# Patient Record
Sex: Male | Born: 1940 | Race: White | Hispanic: No | Marital: Married | Smoking: Never smoker
Health system: Southern US, Community
[De-identification: ages and names within clinical notes are randomized; demographics above are authoritative.]

## PROBLEM LIST (undated history)

## (undated) DIAGNOSIS — F0390 Unspecified dementia without behavioral disturbance: Secondary | ICD-10-CM

## (undated) DIAGNOSIS — R413 Other amnesia: Secondary | ICD-10-CM

## (undated) DIAGNOSIS — R251 Tremor, unspecified: Secondary | ICD-10-CM

## (undated) DIAGNOSIS — R269 Unspecified abnormalities of gait and mobility: Secondary | ICD-10-CM

## (undated) DIAGNOSIS — R972 Elevated prostate specific antigen [PSA]: Secondary | ICD-10-CM

## (undated) DIAGNOSIS — I10 Essential (primary) hypertension: Secondary | ICD-10-CM

## (undated) DIAGNOSIS — G609 Hereditary and idiopathic neuropathy, unspecified: Secondary | ICD-10-CM

## (undated) DIAGNOSIS — N529 Male erectile dysfunction, unspecified: Secondary | ICD-10-CM

## (undated) DIAGNOSIS — N400 Enlarged prostate without lower urinary tract symptoms: Secondary | ICD-10-CM

## (undated) DIAGNOSIS — N200 Calculus of kidney: Secondary | ICD-10-CM

## (undated) DIAGNOSIS — E119 Type 2 diabetes mellitus without complications: Secondary | ICD-10-CM

## (undated) DIAGNOSIS — G4733 Obstructive sleep apnea (adult) (pediatric): Secondary | ICD-10-CM

## (undated) DIAGNOSIS — L719 Rosacea, unspecified: Secondary | ICD-10-CM

## (undated) DIAGNOSIS — S86019A Strain of unspecified Achilles tendon, initial encounter: Secondary | ICD-10-CM

## (undated) HISTORY — DX: Tremor, unspecified: R25.1

## (undated) HISTORY — PX: TONSILLECTOMY: SUR1361

## (undated) HISTORY — PX: TRANSURETHRAL RESECTION OF PROSTATE: SHX73

## (undated) HISTORY — DX: Other amnesia: R41.3

## (undated) HISTORY — DX: Elevated prostate specific antigen (PSA): R97.20

## (undated) HISTORY — DX: Male erectile dysfunction, unspecified: N52.9

## (undated) HISTORY — DX: Hereditary and idiopathic neuropathy, unspecified: G60.9

## (undated) HISTORY — PX: JOINT REPLACEMENT: SHX530

## (undated) HISTORY — PX: INGUINAL HERNIA REPAIR: SHX194

## (undated) HISTORY — PX: TONSILLECTOMY AND ADENOIDECTOMY: SHX28

## (undated) HISTORY — DX: Unspecified dementia, unspecified severity, without behavioral disturbance, psychotic disturbance, mood disturbance, and anxiety: F03.90

## (undated) HISTORY — DX: Unspecified abnormalities of gait and mobility: R26.9

## (undated) HISTORY — DX: Benign prostatic hyperplasia without lower urinary tract symptoms: N40.0

## (undated) HISTORY — DX: Strain of unspecified achilles tendon, initial encounter: S86.019A

## (undated) HISTORY — DX: Rosacea, unspecified: L71.9

## (undated) HISTORY — DX: Obstructive sleep apnea (adult) (pediatric): G47.33

## (undated) HISTORY — PX: EYE SURGERY: SHX253

## (undated) HISTORY — DX: Type 2 diabetes mellitus without complications: E11.9

## (undated) HISTORY — DX: Essential (primary) hypertension: I10

## (undated) HISTORY — DX: Calculus of kidney: N20.0

---

## 1959-10-10 HISTORY — PX: KNEE SURGERY: SHX244

## 2000-10-05 ENCOUNTER — Encounter (INDEPENDENT_AMBULATORY_CARE_PROVIDER_SITE_OTHER): Payer: Self-pay | Admitting: Specialist

## 2000-10-05 ENCOUNTER — Other Ambulatory Visit: Admission: RE | Admit: 2000-10-05 | Discharge: 2000-10-05 | Payer: Self-pay | Admitting: Internal Medicine

## 2002-10-09 HISTORY — PX: CARPAL TUNNEL RELEASE: SHX101

## 2004-08-31 ENCOUNTER — Ambulatory Visit: Payer: Self-pay | Admitting: Internal Medicine

## 2004-09-08 ENCOUNTER — Ambulatory Visit: Payer: Self-pay | Admitting: Internal Medicine

## 2005-02-17 ENCOUNTER — Observation Stay (HOSPITAL_COMMUNITY): Admission: RE | Admit: 2005-02-17 | Discharge: 2005-02-18 | Payer: Self-pay | Admitting: Urology

## 2005-03-01 ENCOUNTER — Observation Stay (HOSPITAL_COMMUNITY): Admission: RE | Admit: 2005-03-01 | Discharge: 2005-03-02 | Payer: Self-pay | Admitting: Urology

## 2005-08-25 ENCOUNTER — Ambulatory Visit: Payer: Self-pay | Admitting: Internal Medicine

## 2005-09-05 ENCOUNTER — Ambulatory Visit: Payer: Self-pay | Admitting: Internal Medicine

## 2005-09-19 ENCOUNTER — Ambulatory Visit: Payer: Self-pay | Admitting: Internal Medicine

## 2005-09-19 ENCOUNTER — Encounter (INDEPENDENT_AMBULATORY_CARE_PROVIDER_SITE_OTHER): Payer: Self-pay | Admitting: *Deleted

## 2005-09-20 ENCOUNTER — Ambulatory Visit: Payer: Self-pay | Admitting: Internal Medicine

## 2005-09-28 ENCOUNTER — Ambulatory Visit: Payer: Self-pay | Admitting: Internal Medicine

## 2005-10-09 HISTORY — PX: HAND SURGERY: SHX662

## 2005-11-14 ENCOUNTER — Ambulatory Visit: Payer: Self-pay | Admitting: Internal Medicine

## 2006-03-08 ENCOUNTER — Ambulatory Visit (HOSPITAL_COMMUNITY): Admission: RE | Admit: 2006-03-08 | Discharge: 2006-03-08 | Payer: Self-pay | Admitting: Urology

## 2006-03-19 ENCOUNTER — Ambulatory Visit (HOSPITAL_COMMUNITY): Admission: RE | Admit: 2006-03-19 | Discharge: 2006-03-19 | Payer: Self-pay | Admitting: Urology

## 2006-03-28 ENCOUNTER — Encounter (INDEPENDENT_AMBULATORY_CARE_PROVIDER_SITE_OTHER): Payer: Self-pay | Admitting: *Deleted

## 2006-03-28 ENCOUNTER — Inpatient Hospital Stay (HOSPITAL_COMMUNITY): Admission: RE | Admit: 2006-03-28 | Discharge: 2006-03-30 | Payer: Self-pay | Admitting: Urology

## 2006-08-06 ENCOUNTER — Encounter: Payer: Self-pay | Admitting: Internal Medicine

## 2006-08-21 ENCOUNTER — Ambulatory Visit: Payer: Self-pay | Admitting: Internal Medicine

## 2006-08-21 LAB — CONVERTED CEMR LAB
ALT: 25 units/L (ref 0–40)
AST: 28 units/L (ref 0–37)
Albumin: 3.8 g/dL (ref 3.5–5.2)
Alkaline Phosphatase: 55 units/L (ref 39–117)
BUN: 19 mg/dL (ref 6–23)
Basophils Absolute: 0 10*3/uL (ref 0.0–0.1)
Basophils Relative: 0.2 % (ref 0.0–1.0)
CO2: 28 meq/L (ref 19–32)
Calcium: 9.3 mg/dL (ref 8.4–10.5)
Chloride: 102 meq/L (ref 96–112)
Chol/HDL Ratio, serum: 6.8
Cholesterol: 174 mg/dL (ref 0–200)
Creatinine, Ser: 1.5 mg/dL (ref 0.4–1.5)
Eosinophil percent: 1 % (ref 0.0–5.0)
GFR calc non Af Amer: 50 mL/min
Glomerular Filtration Rate, Af Am: 60 mL/min/{1.73_m2}
Glucose, Bld: 151 mg/dL — ABNORMAL HIGH (ref 70–99)
HCT: 43.9 % (ref 39.0–52.0)
HDL: 25.7 mg/dL — ABNORMAL LOW (ref >39.0)
Hemoglobin: 13.9 g/dL (ref 13.0–17.0)
Hgb A1c MFr Bld: 6.5 % — ABNORMAL HIGH (ref 4.6–6.0)
LDL Cholesterol: 117 mg/dL — ABNORMAL HIGH (ref 0–99)
Lymphocytes Relative: 21.1 % (ref 12.0–46.0)
MCHC: 31.7 g/dL (ref 30.0–36.0)
MCV: 68.6 fL — ABNORMAL LOW (ref 78.0–100.0)
Monocytes Absolute: 0.9 10*3/uL — ABNORMAL HIGH (ref 0.2–0.7)
Monocytes Relative: 12.1 % — ABNORMAL HIGH (ref 3.0–11.0)
Neutro Abs: 5.1 10*3/uL (ref 1.4–7.7)
Neutrophils Relative %: 65.6 % (ref 43.0–77.0)
PSA: 0.93 ng/mL (ref 0.10–4.00)
Platelets: 189 10*3/uL (ref 150–400)
Potassium: 4.7 meq/L (ref 3.5–5.1)
RBC: 6.39 M/uL — ABNORMAL HIGH (ref 4.22–5.81)
RDW: 15.9 % — ABNORMAL HIGH (ref 11.5–14.6)
Sodium: 139 meq/L (ref 135–145)
TSH: 1.64 microintl units/mL (ref 0.35–5.50)
Total Bilirubin: 1.5 mg/dL — ABNORMAL HIGH (ref 0.3–1.2)
Total Protein: 6.7 g/dL (ref 6.0–8.3)
Triglyceride fasting, serum: 159 mg/dL — ABNORMAL HIGH (ref 0–149)
VLDL: 32 mg/dL (ref 0–40)
WBC: 7.7 10*3/uL (ref 4.5–10.5)

## 2006-08-28 ENCOUNTER — Ambulatory Visit: Payer: Self-pay | Admitting: Internal Medicine

## 2006-09-03 ENCOUNTER — Encounter: Admission: RE | Admit: 2006-09-03 | Discharge: 2006-12-02 | Payer: Self-pay | Admitting: Internal Medicine

## 2006-09-26 ENCOUNTER — Ambulatory Visit: Payer: Self-pay | Admitting: Internal Medicine

## 2007-01-10 ENCOUNTER — Ambulatory Visit: Payer: Self-pay | Admitting: Internal Medicine

## 2007-01-10 DIAGNOSIS — N400 Enlarged prostate without lower urinary tract symptoms: Secondary | ICD-10-CM | POA: Insufficient documentation

## 2007-01-10 DIAGNOSIS — N183 Chronic kidney disease, stage 3 (moderate): Secondary | ICD-10-CM

## 2007-01-10 DIAGNOSIS — M109 Gout, unspecified: Secondary | ICD-10-CM

## 2007-01-10 DIAGNOSIS — I129 Hypertensive chronic kidney disease with stage 1 through stage 4 chronic kidney disease, or unspecified chronic kidney disease: Secondary | ICD-10-CM

## 2007-01-10 LAB — CONVERTED CEMR LAB
ALT: 18 units/L (ref 0–40)
AST: 23 units/L (ref 0–37)
Albumin: 4 g/dL (ref 3.5–5.2)
Alkaline Phosphatase: 72 units/L (ref 39–117)
BUN: 23 mg/dL (ref 6–23)
Bilirubin, Direct: 0.3 mg/dL (ref 0.0–0.3)
CO2: 29 meq/L (ref 19–32)
Calcium: 9.3 mg/dL (ref 8.4–10.5)
Chloride: 108 meq/L (ref 96–112)
Cholesterol: 146 mg/dL (ref 0–200)
Creatinine, Ser: 1.4 mg/dL (ref 0.4–1.5)
GFR calc Af Amer: 65 mL/min
GFR calc non Af Amer: 54 mL/min
Glucose, Bld: 94 mg/dL (ref 70–99)
HDL: 31 mg/dL — ABNORMAL LOW (ref >39.0)
Hgb A1c MFr Bld: 5.9 % (ref 4.6–6.0)
LDL Cholesterol: 96 mg/dL (ref 0–99)
Potassium: 4.7 meq/L (ref 3.5–5.1)
Sodium: 142 meq/L (ref 135–145)
Total Bilirubin: 1.8 mg/dL — ABNORMAL HIGH (ref 0.3–1.2)
Total CHOL/HDL Ratio: 4.7
Total Protein: 6.9 g/dL (ref 6.0–8.3)
Triglycerides: 94 mg/dL (ref 0–149)
VLDL: 19 mg/dL (ref 0–40)

## 2007-06-04 DIAGNOSIS — R972 Elevated prostate specific antigen [PSA]: Secondary | ICD-10-CM | POA: Insufficient documentation

## 2007-06-04 DIAGNOSIS — F528 Other sexual dysfunction not due to a substance or known physiological condition: Secondary | ICD-10-CM

## 2007-07-11 ENCOUNTER — Ambulatory Visit: Payer: Self-pay | Admitting: Internal Medicine

## 2007-07-12 LAB — CONVERTED CEMR LAB
ALT: 19 units/L (ref 0–53)
AST: 23 units/L (ref 0–37)
Albumin: 3.9 g/dL (ref 3.5–5.2)
Alkaline Phosphatase: 45 units/L (ref 39–117)
BUN: 19 mg/dL (ref 6–23)
Bilirubin, Direct: 0.2 mg/dL (ref 0.0–0.3)
CO2: 26 meq/L (ref 19–32)
Calcium: 9.2 mg/dL (ref 8.4–10.5)
Chloride: 110 meq/L (ref 96–112)
Cholesterol: 141 mg/dL (ref 0–200)
Creatinine, Ser: 1.7 mg/dL — ABNORMAL HIGH (ref 0.4–1.5)
GFR calc Af Amer: 52 mL/min
GFR calc non Af Amer: 43 mL/min
Glucose, Bld: 106 mg/dL — ABNORMAL HIGH (ref 70–99)
HDL: 28.1 mg/dL — ABNORMAL LOW (ref >39.0)
Hgb A1c MFr Bld: 5.7 % (ref 4.6–6.0)
LDL Cholesterol: 94 mg/dL (ref 0–99)
Potassium: 4.9 meq/L (ref 3.5–5.1)
Sodium: 140 meq/L (ref 135–145)
Total Bilirubin: 1.1 mg/dL (ref 0.3–1.2)
Total CHOL/HDL Ratio: 5
Total Protein: 6.4 g/dL (ref 6.0–8.3)
Triglycerides: 97 mg/dL (ref 0–149)
VLDL: 19 mg/dL (ref 0–40)

## 2007-07-22 ENCOUNTER — Encounter: Payer: Self-pay | Admitting: Internal Medicine

## 2007-08-06 ENCOUNTER — Ambulatory Visit (HOSPITAL_BASED_OUTPATIENT_CLINIC_OR_DEPARTMENT_OTHER): Admission: RE | Admit: 2007-08-06 | Discharge: 2007-08-06 | Payer: Self-pay | Admitting: Orthopedic Surgery

## 2007-08-06 ENCOUNTER — Encounter (INDEPENDENT_AMBULATORY_CARE_PROVIDER_SITE_OTHER): Payer: Self-pay | Admitting: Orthopedic Surgery

## 2007-12-26 ENCOUNTER — Ambulatory Visit: Payer: Self-pay | Admitting: Internal Medicine

## 2008-01-09 ENCOUNTER — Ambulatory Visit: Payer: Self-pay | Admitting: Internal Medicine

## 2008-01-10 LAB — CONVERTED CEMR LAB
ALT: 26 units/L (ref 0–53)
Alkaline Phosphatase: 47 units/L (ref 39–117)
BUN: 19 mg/dL (ref 6–23)
Bilirubin, Direct: 0.2 mg/dL (ref 0.0–0.3)
CO2: 28 meq/L (ref 19–32)
Chloride: 106 meq/L (ref 96–112)
Creatinine, Ser: 1.9 mg/dL — ABNORMAL HIGH (ref 0.4–1.5)
GFR calc Af Amer: 45 mL/min
HDL: 20.7 mg/dL — ABNORMAL LOW (ref >39.0)
Hgb A1c MFr Bld: 6.1 % — ABNORMAL HIGH (ref 4.6–6.0)
Sodium: 139 meq/L (ref 135–145)
Total CHOL/HDL Ratio: 7.2

## 2008-09-01 ENCOUNTER — Ambulatory Visit: Payer: Self-pay | Admitting: Internal Medicine

## 2008-09-02 LAB — CONVERTED CEMR LAB
CO2: 28 meq/L (ref 19–32)
Calcium: 9.2 mg/dL (ref 8.4–10.5)
Chloride: 105 meq/L (ref 96–112)
Cholesterol: 149 mg/dL (ref 0–200)
Creatinine, Ser: 1.8 mg/dL — ABNORMAL HIGH (ref 0.4–1.5)
GFR calc non Af Amer: 40 mL/min
LDL Cholesterol: 94 mg/dL (ref 0–99)
Potassium: 5.2 meq/L — ABNORMAL HIGH (ref 3.5–5.1)
Sodium: 139 meq/L (ref 135–145)
Total CHOL/HDL Ratio: 5.2
Triglycerides: 131 mg/dL (ref 0–149)

## 2008-12-07 ENCOUNTER — Ambulatory Visit: Payer: Self-pay | Admitting: Internal Medicine

## 2008-12-11 ENCOUNTER — Ambulatory Visit: Payer: Self-pay | Admitting: Internal Medicine

## 2008-12-21 ENCOUNTER — Telehealth: Payer: Self-pay | Admitting: Family Medicine

## 2008-12-22 ENCOUNTER — Ambulatory Visit: Payer: Self-pay | Admitting: Internal Medicine

## 2008-12-22 ENCOUNTER — Encounter: Payer: Self-pay | Admitting: Internal Medicine

## 2008-12-22 LAB — HM COLONOSCOPY

## 2008-12-24 ENCOUNTER — Telehealth: Payer: Self-pay | Admitting: Internal Medicine

## 2008-12-24 ENCOUNTER — Encounter: Payer: Self-pay | Admitting: Internal Medicine

## 2008-12-25 ENCOUNTER — Ambulatory Visit: Payer: Self-pay | Admitting: Internal Medicine

## 2008-12-25 ENCOUNTER — Ambulatory Visit: Payer: Self-pay | Admitting: Cardiology

## 2008-12-28 ENCOUNTER — Telehealth: Payer: Self-pay | Admitting: Internal Medicine

## 2009-03-11 ENCOUNTER — Telehealth: Payer: Self-pay | Admitting: *Deleted

## 2009-03-17 ENCOUNTER — Encounter: Payer: Self-pay | Admitting: Internal Medicine

## 2009-05-17 ENCOUNTER — Telehealth: Payer: Self-pay | Admitting: Internal Medicine

## 2009-05-26 ENCOUNTER — Ambulatory Visit: Payer: Self-pay | Admitting: Internal Medicine

## 2009-05-31 LAB — CONVERTED CEMR LAB
ALT: 21 units/L (ref 0–53)
AST: 25 units/L (ref 0–37)
Albumin: 4.1 g/dL (ref 3.5–5.2)
Alkaline Phosphatase: 57 units/L (ref 39–117)
CO2: 26 meq/L (ref 19–32)
Cholesterol: 154 mg/dL (ref 0–200)
Creatinine, Ser: 2 mg/dL — ABNORMAL HIGH (ref 0.4–1.5)
Direct LDL: 97.8 mg/dL
GFR calc non Af Amer: 35.24 mL/min (ref >60)
Glucose, Bld: 92 mg/dL (ref 70–99)
HDL: 26.7 mg/dL — ABNORMAL LOW (ref >39.00)
Potassium: 5.3 meq/L — ABNORMAL HIGH (ref 3.5–5.1)
Triglycerides: 206 mg/dL — ABNORMAL HIGH (ref 0.0–149.0)

## 2009-06-24 ENCOUNTER — Telehealth: Payer: Self-pay | Admitting: Internal Medicine

## 2009-06-24 ENCOUNTER — Ambulatory Visit: Payer: Self-pay | Admitting: Internal Medicine

## 2009-06-29 LAB — CONVERTED CEMR LAB
Calcium: 9 mg/dL (ref 8.4–10.5)
Chloride: 111 meq/L (ref 96–112)
Creatinine, Ser: 2.1 mg/dL — ABNORMAL HIGH (ref 0.4–1.5)
Glucose, Bld: 108 mg/dL — ABNORMAL HIGH (ref 70–99)
Potassium: 5.4 meq/L — ABNORMAL HIGH (ref 3.5–5.1)

## 2009-07-14 ENCOUNTER — Encounter: Payer: Self-pay | Admitting: Internal Medicine

## 2009-07-19 ENCOUNTER — Encounter: Payer: Self-pay | Admitting: Internal Medicine

## 2009-07-20 ENCOUNTER — Encounter: Payer: Self-pay | Admitting: Internal Medicine

## 2009-07-22 ENCOUNTER — Ambulatory Visit: Payer: Self-pay | Admitting: Internal Medicine

## 2009-07-22 ENCOUNTER — Telehealth: Payer: Self-pay | Admitting: Internal Medicine

## 2009-07-23 LAB — CONVERTED CEMR LAB
Calcium: 8.8 mg/dL (ref 8.4–10.5)
Chloride: 106 meq/L (ref 96–112)
Potassium: 4.7 meq/L (ref 3.5–5.1)

## 2009-08-11 ENCOUNTER — Telehealth: Payer: Self-pay | Admitting: Internal Medicine

## 2009-08-16 ENCOUNTER — Encounter: Payer: Self-pay | Admitting: Internal Medicine

## 2009-09-16 ENCOUNTER — Encounter: Payer: Self-pay | Admitting: Internal Medicine

## 2009-11-04 ENCOUNTER — Ambulatory Visit: Payer: Self-pay | Admitting: Internal Medicine

## 2009-11-04 LAB — CONVERTED CEMR LAB
ALT: 20 units/L (ref 0–53)
BUN: 22 mg/dL (ref 6–23)
Chloride: 110 meq/L (ref 96–112)
GFR calc non Af Amer: 35.19 mL/min (ref >60)
HDL: 33.2 mg/dL — ABNORMAL LOW (ref >39.00)
LDL Cholesterol: 92 mg/dL (ref 0–99)
Potassium: 4.7 meq/L (ref 3.5–5.1)
Total CHOL/HDL Ratio: 4

## 2009-11-22 ENCOUNTER — Ambulatory Visit (HOSPITAL_BASED_OUTPATIENT_CLINIC_OR_DEPARTMENT_OTHER): Admission: RE | Admit: 2009-11-22 | Discharge: 2009-11-22 | Payer: Self-pay | Admitting: Internal Medicine

## 2009-11-22 ENCOUNTER — Encounter: Payer: Self-pay | Admitting: Internal Medicine

## 2009-11-28 ENCOUNTER — Ambulatory Visit: Payer: Self-pay | Admitting: Pulmonary Disease

## 2009-12-14 ENCOUNTER — Encounter: Payer: Self-pay | Admitting: Internal Medicine

## 2009-12-21 ENCOUNTER — Encounter: Payer: Self-pay | Admitting: Internal Medicine

## 2009-12-31 ENCOUNTER — Encounter: Payer: Self-pay | Admitting: Internal Medicine

## 2010-03-03 ENCOUNTER — Ambulatory Visit: Payer: Self-pay | Admitting: Internal Medicine

## 2010-03-03 DIAGNOSIS — M1710 Unilateral primary osteoarthritis, unspecified knee: Secondary | ICD-10-CM

## 2010-03-03 DIAGNOSIS — IMO0002 Reserved for concepts with insufficient information to code with codable children: Secondary | ICD-10-CM | POA: Insufficient documentation

## 2010-03-15 ENCOUNTER — Ambulatory Visit: Payer: Self-pay | Admitting: Internal Medicine

## 2010-03-16 LAB — CONVERTED CEMR LAB: Potassium: 5.6 meq/L — ABNORMAL HIGH (ref 3.5–5.1)

## 2010-03-22 ENCOUNTER — Ambulatory Visit: Payer: Self-pay | Admitting: Internal Medicine

## 2010-03-31 ENCOUNTER — Telehealth: Payer: Self-pay | Admitting: Internal Medicine

## 2010-04-04 ENCOUNTER — Inpatient Hospital Stay (HOSPITAL_COMMUNITY)
Admission: RE | Admit: 2010-04-04 | Discharge: 2010-04-07 | Payer: Self-pay | Source: Home / Self Care | Admitting: Orthopedic Surgery

## 2010-05-02 ENCOUNTER — Ambulatory Visit: Payer: Self-pay | Admitting: Internal Medicine

## 2010-05-02 DIAGNOSIS — R5381 Other malaise: Secondary | ICD-10-CM

## 2010-05-02 DIAGNOSIS — R5383 Other fatigue: Secondary | ICD-10-CM

## 2010-05-03 LAB — CONVERTED CEMR LAB
AST: 22 units/L (ref 0–37)
BUN: 21 mg/dL (ref 6–23)
Chloride: 101 meq/L (ref 96–112)
Creatinine, Ser: 1.6 mg/dL — ABNORMAL HIGH (ref 0.4–1.5)
Eosinophils Relative: 3.8 % (ref 0.0–5.0)
GFR calc non Af Amer: 47.16 mL/min (ref >60)
Hemoglobin: 11 g/dL — ABNORMAL LOW (ref 13.0–17.0)
Monocytes Relative: 16.4 % — ABNORMAL HIGH (ref 3.0–12.0)
Neutrophils Relative %: 58.8 % (ref 43.0–77.0)
Platelets: 268 10*3/uL (ref 150.0–400.0)
Potassium: 4.9 meq/L (ref 3.5–5.1)
RBC: 4.99 M/uL (ref 4.22–5.81)
Sed Rate: 31 mm/hr — ABNORMAL HIGH (ref 0–22)
Total Bilirubin: 0.7 mg/dL (ref 0.3–1.2)
Total Protein: 6.8 g/dL (ref 6.0–8.3)
WBC: 7.2 10*3/uL (ref 4.5–10.5)

## 2010-05-11 ENCOUNTER — Encounter: Payer: Self-pay | Admitting: Internal Medicine

## 2010-05-16 ENCOUNTER — Encounter: Payer: Self-pay | Admitting: Internal Medicine

## 2010-05-29 ENCOUNTER — Encounter: Payer: Self-pay | Admitting: Internal Medicine

## 2010-07-14 ENCOUNTER — Telehealth: Payer: Self-pay | Admitting: *Deleted

## 2010-10-04 ENCOUNTER — Telehealth: Payer: Self-pay | Admitting: Internal Medicine

## 2010-11-06 LAB — CONVERTED CEMR LAB
BUN: 27 mg/dL — ABNORMAL HIGH (ref 6–23)
Bilirubin, Direct: 0.1 mg/dL (ref 0.0–0.3)
CO2: 26 meq/L (ref 19–32)
Cholesterol: 147 mg/dL (ref 0–200)
Creatinine, Ser: 1.7 mg/dL — ABNORMAL HIGH (ref 0.4–1.5)
HDL: 30.8 mg/dL — ABNORMAL LOW (ref >39.00)
LDL Cholesterol: 101 mg/dL — ABNORMAL HIGH (ref 0–99)
Sodium: 139 meq/L (ref 135–145)
Total CHOL/HDL Ratio: 5
Total Protein: 6.6 g/dL (ref 6.0–8.3)
Triglycerides: 76 mg/dL (ref 0.0–149.0)
VLDL: 15.2 mg/dL (ref 0.0–40.0)

## 2010-11-08 NOTE — Miscellaneous (Signed)
Summary: Orders Update  Clinical Lists Changes  Orders: Added new Referral order of Home Health Referral (Home Health) - Signed 

## 2010-11-08 NOTE — Progress Notes (Signed)
Summary: Flu immunization [facility]    Immunization History:  Influenza Immunization History:    Influenza:  historical (07/13/2010)

## 2010-11-08 NOTE — Medication Information (Signed)
Summary: Order Confirmation for CPAP Supplies  Order Confirmation for CPAP Supplies   Imported By: Maryln Gottron 12/24/2009 15:08:08  _____________________________________________________________________  External Attachment:    Type:   Image     Comment:   External Document

## 2010-11-08 NOTE — Medication Information (Signed)
Summary: Order for CPAP Supplies/[facility]  Order for CPAP Supplies/[facility]   Imported By: Maryln Gottron 01/04/2010 14:27:18  _____________________________________________________________________  External Attachment:    Type:   Image     Comment:   External Document

## 2010-11-08 NOTE — Assessment & Plan Note (Signed)
Summary: medical clearance for knee surgery/njr   Vital Signs:  Patient profile:   70 year old male Weight:      280 pounds BMI:     35.12 Temp:     98.4 degrees F Pulse rate:   60 / minute Pulse rhythm:   regular Resp:     14 per minute BP sitting:   136 / 78  (left arm) Cuff size:   large  Vitals Entered By: Gladis Riffle, RN (Mar 03, 2010 9:01 AM) CC: surgical clearance for left knee replacement Dr Despina Hick Is Patient Diabetic? Yes Did you bring your meter with you today? No   CC:  surgical clearance for left knee replacement Dr Despina Hick.  History of Present Illness: pt here for medical evaluation prior to knee surgery Dr. Gerri Spore pt denies CP,SOB, PND or any other cardiovascular complaints Pt has never had trouble with general anesthesia  Pt remains active and has no cardiac sxs with exercise  All other systems reviewed and were negative   Preventive Screening-Counseling & Management  Alcohol-Tobacco     Smoking Status: quit  Current Problems (verified): 1)  Psa, Increased  (ICD-790.93) 2)  Erectile Dysfunction  (ICD-302.72) 3)  Obstructive Sleep Apnea  (ICD-327.23) 4)  Benign Prostatic Hypertrophy  (ICD-600.00) 5)  Nephrolithiasis, Hx of  (ICD-V13.01) 6)  Hypertension  (ICD-401.9) 7)  Gout  (ICD-274.9) 8)  Diabetes Mellitus, Type II  (ICD-250.00)  Current Medications (verified): 1)  Lisinopril 20 Mg Tabs (Lisinopril) .... Take 1 Tablet By Mouth Once A Day 2)  Finasteride 5 Mg  Tabs (Finasteride) .... Take 1 Tablet By Mouth Once A Day 3)  Sulfamethoxazole-Tmp Ds 800-160 Mg  Tabs (Sulfamethoxazole-Trimethoprim) .... Take 1 Tablet By Mouth Two Times A Day 4)  Fluticasone Propionate 50 Mcg/act  Susp (Fluticasone Propionate) .... 2 Sprays Each Nostril Once Daily  Allergies: 1)  ! Cipro (Ciprofloxacin Hcl)  Past History:  Past Medical History: Last updated: 07/11/2007 obstructive sleep apnea Diabetes mellitus, type II Gout Hypertension Nephrolithiasis, hx  of Benign prostatic hypertrophy rosacea partial Achilles tear erectile dysfunction elevated PSA right shoulder (skin surgery)  Past Surgical History: Last updated: 06/04/2007 Carpal tunnel release  2004 Inguinal herniorrhaphy L Transurethral resection of prostate Tonsillectomy, adenoidectomy hand surgery R  2007 knee surgery L  1961  Family History: Last updated: 07/11/2007 Family History Diabetes 1st degree relative son with thalassemia  Social History: Last updated: 07/11/2007 Married Regular exercise-yes  Risk Factors: Exercise: yes (07/11/2007)  Risk Factors: Smoking Status: quit (03/03/2010)  Physical Exam  General:  alert and well-developed.   Head:  normocephalic and atraumatic.   Eyes:  pupils equal and pupils round.   Ears:  R ear normal and L ear normal.   Neck:  No deformities, masses, or tenderness noted. Chest Wall:  no deformities and no tenderness.   Lungs:  normal respiratory effort and no accessory muscle use.   Heart:  normal rate and regular rhythm.   Abdomen:  soft and non-tender.   Msk:  No deformity or scoliosis noted of thoracic or lumbar spine.   Pulses:  R radial normal and L radial normal.   Neurologic:  cranial nerves II-XII intact.   Skin:  turgor normal and color normal.   Psych:  normally interactive and good eye contact.     Impression & Recommendations:  Problem # 1:  OSTEOARTHRITIS, KNEE, LEFT (ICD-715.96) discussed risks he has not had any difficulty with anesthesia The following medications were removed from the medication list:  Aspirin 325 Mg Tabs (Aspirin) .Marland Kitchen... Take 1 tab by mouth every day  Problem # 2:  OBSTRUCTIVE SLEEP APNEA (ICD-327.23) will need to continue CPAP  Problem # 3:  DIABETES MELLITUS, TYPE II (ICD-250.00) check labs today The following medications were removed from the medication list:    Aspirin 325 Mg Tabs (Aspirin) .Marland Kitchen... Take 1 tab by mouth every day His updated medication list for this  problem includes:    Lisinopril 20 Mg Tabs (Lisinopril) .Marland Kitchen... Take 1 tablet by mouth once a day  Labs Reviewed: Creat: 2.0 (11/04/2009)    Reviewed HgBA1c results: 6.4 (11/04/2009)  6.3 (05/26/2009)  Orders: TLB-Lipid Panel (80061-LIPID) TLB-Hepatic/Liver Function Pnl (80076-HEPATIC) TLB-A1C / Hgb A1C (Glycohemoglobin) (83036-A1C)  Problem # 4:  GOUT (ICD-274.9) suspect re  Complete Medication List: 1)  Lisinopril 20 Mg Tabs (Lisinopril) .... Take 1 tablet by mouth once a day 2)  Finasteride 5 Mg Tabs (Finasteride) .... Take 1 tablet by mouth once a day 3)  Sulfamethoxazole-tmp Ds 800-160 Mg Tabs (Sulfamethoxazole-trimethoprim) .... Take 1 tablet by mouth two times a day 4)  Fluticasone Propionate 50 Mcg/act Susp (Fluticasone propionate) .... 2 sprays each nostril once daily  Other Orders: EKG w/ Interpretation (93000) Venipuncture (45409) TLB-BMP (Basic Metabolic Panel-BMET) (80048-METABOL)

## 2010-11-08 NOTE — Assessment & Plan Note (Signed)
Summary: 3 MONTH ROV/NJR   Vital Signs:  Patient profile:   70 year old male Height:      75 inches Weight:      280 pounds BMI:     35.12 Temp:     98.2 degrees F Pulse rate:   60 / minute Resp:     14 per minute BP sitting:   120 / 68  (left arm) Cuff size:   large  Vitals Entered By: Gladis Riffle, RN (November 04, 2009 8:27 AM)   History of Present Illness:  Follow-Up Visit      This is a 70 year old man who presents for Follow-up visit.  The patient denies chest pain and palpitations.  Since the last visit the patient notes no new problems or concerns.  The patient reports taking meds as prescribed and not taking meds as prescribed.  When questioned about possible medication side effects, the patient notes none.   was not taking fluticasone  All other systems reviewed and were negative  using CPAP  Preventive Screening-Counseling & Management  Alcohol-Tobacco     Smoking Status: quit  Current Problems (verified): 1)  Psa, Increased  (ICD-790.93) 2)  Erectile Dysfunction  (ICD-302.72) 3)  Obstructive Sleep Apnea  (ICD-327.23) 4)  Benign Prostatic Hypertrophy  (ICD-600.00) 5)  Nephrolithiasis, Hx of  (ICD-V13.01) 6)  Hypertension  (ICD-401.9) 7)  Gout  (ICD-274.9) 8)  Diabetes Mellitus, Type II  (ICD-250.00) 9)  Diabetes Mellitus, Type II, Controlled  (ICD-250.00)  Current Medications (verified): 1)  Lisinopril 20 Mg Tabs (Lisinopril) .... Take 1 Tablet By Mouth Once A Day 2)  Finasteride 5 Mg  Tabs (Finasteride) .... Take 1 Tablet By Mouth Once A Day 3)  Sulfamethoxazole-Tmp Ds 800-160 Mg  Tabs (Sulfamethoxazole-Trimethoprim) .... Take 1 Tablet By Mouth Two Times A Day 4)  Fluticasone Propionate 50 Mcg/act  Susp (Fluticasone Propionate) .... 2 Sprays Each Nostril Once Daily 5)  Vitamin D 2000 Unit Tabs (Cholecalciferol) .... Once Daily 6)  Omega-3 350 Mg Caps (Omega-3 Fatty Acids) .... Once Daily  Allergies: 1)  ! Cipro (Ciprofloxacin  Hcl)  Comments:  Nurse/Medical Assistant: 3 month rov, fasting  The patient's medications and allergies were reviewed with the patient and were updated in the Medication and Allergy Lists. Gladis Riffle, RN (November 04, 2009 8:30 AM)  Past History:  Past Medical History: Last updated: 07/11/2007 obstructive sleep apnea Diabetes mellitus, type II Gout Hypertension Nephrolithiasis, hx of Benign prostatic hypertrophy rosacea partial Achilles tear erectile dysfunction elevated PSA right shoulder (skin surgery)  Past Surgical History: Last updated: 06/04/2007 Carpal tunnel release  2004 Inguinal herniorrhaphy L Transurethral resection of prostate Tonsillectomy, adenoidectomy hand surgery R  2007 knee surgery L  1961  Family History: Last updated: 07/11/2007 Family History Diabetes 1st degree relative son with thalassemia  Social History: Last updated: 07/11/2007 Married Regular exercise-yes  Risk Factors: Exercise: yes (07/11/2007)  Risk Factors: Smoking Status: quit (11/04/2009)  Review of Systems       All other systems reviewed and were negative   Physical Exam  General:  overweight-appearing.  enlarged nodes.  No erythema. Head:  normocephalic and atraumatic.   Nose:  rosacea, large nose Neck:  No deformities, masses, or tenderness noted. Chest Wall:  no deformities and no tenderness.   Lungs:  normal respiratory effort.   Heart:  normal rate and regular rhythm.   Abdomen:  Bowel sounds positive,abdomen soft and non-tender without masses, organomegaly or hernias noted. overweight. Msk:  No deformity  or scoliosis noted of thoracic or lumbar spine.   Pulses:  R radial normal and L radial normal.   Neurologic:  cranial nerves II-XII intact and gait normal.     Impression & Recommendations:  Problem # 1:  HYPERTENSION (ICD-401.9) controlled continue current medications  His updated medication list for this problem includes:    Lisinopril 20 Mg Tabs  (Lisinopril) .Marland Kitchen... Take 1 tablet by mouth once a day  BP today: 120/68 Prior BP: 132/72 (05/26/2009)  Labs Reviewed: K+: 4.7 (07/22/2009) Creat: : 2.2 (07/22/2009)   Chol: 154 (05/26/2009)   HDL: 26.70 (05/26/2009)   LDL: 94 (09/01/2008)   TG: 206.0 (05/26/2009)  Problem # 2:  OBSTRUCTIVE SLEEP APNEA (ICD-327.23)  sleep study  Orders: Sleep Disorder Referral (Sleep Disorder)  Problem # 3:  DIABETES MELLITUS, TYPE II (ICD-250.00) check labs today needs to lose a tremendous amount of weight His updated medication list for this problem includes:    Lisinopril 20 Mg Tabs (Lisinopril) .Marland Kitchen... Take 1 tablet by mouth once a day    Aspirin 325 Mg Tabs (Aspirin) .Marland Kitchen... Take 1 tab by mouth every day  Orders: TLB-A1C / Hgb A1C (Glycohemoglobin) (83036-A1C) TLB-BMP (Basic Metabolic Panel-BMET) (80048-METABOL) TLB-Lipid Panel (80061-LIPID) TLB-ALT (SGPT) (84460-ALT)  Complete Medication List: 1)  Lisinopril 20 Mg Tabs (Lisinopril) .... Take 1 tablet by mouth once a day 2)  Finasteride 5 Mg Tabs (Finasteride) .... Take 1 tablet by mouth once a day 3)  Sulfamethoxazole-tmp Ds 800-160 Mg Tabs (Sulfamethoxazole-trimethoprim) .... Take 1 tablet by mouth two times a day 4)  Fluticasone Propionate 50 Mcg/act Susp (Fluticasone propionate) .... 2 sprays each nostril once daily 5)  Vitamin D 2000 Unit Tabs (Cholecalciferol) .... Once daily 6)  Omega-3 350 Mg Caps (Omega-3 fatty acids) .... Once daily 7)  Aspirin 325 Mg Tabs (Aspirin) .... Take 1 tab by mouth every day Prescriptions: FLUTICASONE PROPIONATE 50 MCG/ACT  SUSP (FLUTICASONE PROPIONATE) 2 sprays each nostril once daily  #1 vial x 3   Entered and Authorized by:   Birdie Sons MD   Signed by:   Birdie Sons MD on 11/04/2009   Method used:   Electronically to        [facility] [phone]* (retail)       [address]       [place], [place]  [address]       Ph: [phone] or [phone]       Fax:  [phone]   RxID:   [id number]

## 2010-11-08 NOTE — Assessment & Plan Note (Signed)
Summary: 4 week rov/et/wife rescd//ccm   Vital Signs:  Patient profile:   70 year old male Weight:      268 pounds Temp:     98.4 degrees F oral Pulse rate:   64 / minute Pulse rhythm:   regular BP sitting:   124 / 74  (left arm) Cuff size:   regular  Vitals Entered By: Kern Reap CMA Duncan Dull) (May 02, 2010 10:35 AM)  History of Present Illness: s/p TKA doing reasonably well tolerating rehab he feels generally weak, feels a little wobbly when he first stands,   DM---trying to follow a diet  Current Problems (verified): 1)  Osteoarthritis, Knee, Left  (ICD-715.96) 2)  Psa, Increased  (ICD-790.93) 3)  Erectile Dysfunction  (ICD-302.72) 4)  Obstructive Sleep Apnea  (ICD-327.23) 5)  Benign Prostatic Hypertrophy  (ICD-600.00) 6)  Nephrolithiasis, Hx of  (ICD-V13.01) 7)  Hypertension  (ICD-401.9) 8)  Gout  (ICD-274.9) 9)  Diabetes Mellitus, Type II  (ICD-250.00)  Current Medications (verified): 1)  Finasteride 5 Mg  Tabs (Finasteride) .... Take 1 Tablet By Mouth Once A Day 2)  Sulfamethoxazole-Tmp Ds 800-160 Mg  Tabs (Sulfamethoxazole-Trimethoprim) .... Take 1 Tablet By Mouth Two Times A Day 3)  Fluticasone Propionate 50 Mcg/act  Susp (Fluticasone Propionate) .... 2 Sprays Each Nostril Once Daily 4)  Diltiazem Hcl Er Beads 240 Mg Xr24h-Cap (Diltiazem Hcl Er Beads) .... Take 1 Tablet By Mouth Once A Day 5)  Stool Softner .... Once Daily 6)  Miralax  Powd (Polyethylene Glycol 3350) .... Once Daily  Allergies: 1)  ! Cipro (Ciprofloxacin Hcl)  Past History:  Past Medical History: Last updated: 07/11/2007 obstructive sleep apnea Diabetes mellitus, type II Gout Hypertension Nephrolithiasis, hx of Benign prostatic hypertrophy rosacea partial Achilles tear erectile dysfunction elevated PSA right shoulder (skin surgery)  Past Surgical History: Last updated: 06/04/2007 Carpal tunnel release  2004 Inguinal herniorrhaphy L Transurethral resection of  prostate Tonsillectomy, adenoidectomy hand surgery R  2007 knee surgery L  1961  Family History: Last updated: 07/11/2007 Family History Diabetes 1st degree relative son with thalassemia  Social History: Last updated: 07/11/2007 Married Regular exercise-yes  Risk Factors: Exercise: yes (07/11/2007)  Risk Factors: Smoking Status: quit (03/03/2010)  Physical Exam  General:  alert and well-developed.   Head:  normocephalic and atraumatic.   Eyes:  pupils equal and pupils round.   Neck:  No deformities, masses, or tenderness noted. Chest Wall:  no deformities and no tenderness.   Lungs:  normal respiratory effort and no accessory muscle use.   Msk:  surgical scar left knee Extremities:  trace left pedal edema and trace right pedal edema.   Neurologic:  alert & oriented X3 and cranial nerves II-XII intact.     Impression & Recommendations:  Problem # 1:  OSTEOARTHRITIS, KNEE, LEFT (ICD-715.96) s/p TKA fatigue after surgery this may be normal but will check labs   Problem # 2:  FATIGUE (ICD-780.79)   suspect  post op fatigue  Orders: TLB-BMP (Basic Metabolic Panel-BMET) (80048-METABOL) TLB-Hepatic/Liver Function Pnl (80076-HEPATIC) TLB-CBC Platelet - w/Differential (85025-CBCD) TLB-TSH (Thyroid Stimulating Hormone) (84443-TSH) Venipuncture (16109) TLB-Sedimentation Rate (ESR) (85652-ESR)  Complete Medication List: 1)  Finasteride 5 Mg Tabs (Finasteride) .... Take 1 tablet by mouth once a day 2)  Sulfamethoxazole-tmp Ds 800-160 Mg Tabs (Sulfamethoxazole-trimethoprim) .... Take 1 tablet by mouth two times a day 3)  Fluticasone Propionate 50 Mcg/act Susp (Fluticasone propionate) .... 2 sprays each nostril once daily 4)  Diltiazem Hcl Er Beads 240 Mg Xr24h-cap (  Diltiazem hcl er beads) .... Take 1 tablet by mouth once a day 5)  Stool Softner  .... Once daily 6)  Miralax Powd (Polyethylene glycol 3350) .... Once daily

## 2010-11-08 NOTE — Progress Notes (Signed)
Summary:  diltiazem  Phone Note Call from Patient Call back at Home Phone [phone]   Caller: Patient Call For: Birdie Sons MD Summary of Call: pt needs diltiazem 240 mg #90 with 3 refills rx fax to [phone] and please notify [facility] to stop lisinopril  Initial call taken by: Heron Sabins,  March 31, 2010 12:53 PM  Follow-up for Phone Call        See Rx.  DC Lisinopril added to Rx.  Left message to inform pt. Follow-up by: Gladis Riffle, RN,  March 31, 2010 1:01 PM    Prescriptions: DILTIAZEM HCL ER BEADS 240 MG XR24H-CAP (DILTIAZEM HCL ER BEADS) Take 1 tablet by mouth once a day  #90 x 3   Entered by:   Gladis Riffle, RN   Authorized by:   Birdie Sons MD   Signed by:   Gladis Riffle, RN on 03/31/2010   Method used:   Electronically to        [facility]* (retail)             ,          Ph: [phone]       Fax: [phone]   RxID:   [id number]

## 2010-11-08 NOTE — Medication Information (Signed)
Summary: Order Confirmation for CPAP Supplies  Order Confirmation for CPAP Supplies   Imported By: Maryln Gottron 12/17/2009 09:49:15  _____________________________________________________________________  External Attachment:    Type:   Image     Comment:   External Document

## 2010-11-10 NOTE — Progress Notes (Signed)
Summary: Call-A-Nurse Report   Call-A-Nurse Triage Call Report Triage Record Num: [id number] Operator: Di Kindle Patient Name: [name] Call Date & Time: 10/02/2010 8:38:11AM Patient Phone: [phone] PCP: Valetta Mole. Swords Patient Gender: Male PCP Fax : [phone] Patient DOB: [birth year] Practice Name: [facility] Reason for Call: Wife Joyce Gross calling, onset of sore throat 10/02/10, Per CSR "THE PATIENT REFUSED 911" has been garling and mucinex. States family has required ABX with this, afebrile. Guideline: sore throat, cough makes it difficult to sleep. Advised of need to be seen if continues/worsens. Protocol(s) Used: Sore Throat or Hoarseness Recommended Outcome per Protocol: Provide Home/Self Care Reason for Outcome: All other situations Care Advice:  ~ Use a cool mist humidifier to moisten air. Be sure to clean according to manufacturer's instructions. Throat symptoms that do not resolve must be evaluated by Nakyia Dau to rule out serious causes such as cancer. Tobacco use in any form increases risk of cancer.  ~  ~ See a Dakia Schifano if have sore throat symptoms for 2 weeks or more, or if have swollen lymph nodes. Have someone call Lori Popowski if there is total loss of voice for 2 weeks, despite home care measures that include total voice rest.  ~ Analgesic/Antipyretic Advice - Acetaminophen: Consider acetaminophen as directed on label or by pharmacist/Savhanna Sliva for pain or fever PRECAUTIONS: - Use if there is no history of liver disease, alcoholism, or intake of three or more alcohol drinks per day - Only if approved by [facility] during pregnancy or when breastfeeding - During pregnancy, acetaminophen should not be taken more than 3 consecutive days without telling Cyrilla Durkin - Do not exceed recommended dose or frequency  ~ Sore Throat Relief: - Use warm salt water gargles 3 to 4 times/day, as needed (1/2 tsp. salt in 8 oz. [.2 liters] water). - Suck on hard candy,  nonprescription or herbal throat lozenges (sugar-free if diabetic) - Eat soothing, soft food/fluids (broths, soups, or honey and lemon juice in hot tea, Popsicles, frozen yogurt or sherbet, scrambled eggs, cooked cereals, Jell-O or puddings) whichever is most comforting. - Avoid eating salty, spicy or acidic foods.  ~ 10/02/2010 8:50:17AM Page 1 of 1 [id number]

## 2010-11-11 NOTE — Letter (Signed)
Summary: Medical Necessity for CPAP  Medical Necessity for CPAP   Imported By: Maryln Gottron 05/23/2010 12:26:01  _____________________________________________________________________  External Attachment:    Type:   Image     Comment:   External Document

## 2010-12-25 LAB — BASIC METABOLIC PANEL
BUN: 23 mg/dL (ref 6–23)
BUN: 25 mg/dL — ABNORMAL HIGH (ref 6–23)
CO2: 25 mEq/L (ref 19–32)
CO2: 27 mEq/L (ref 19–32)
Calcium: 8.3 mg/dL — ABNORMAL LOW (ref 8.4–10.5)
Calcium: 8.3 mg/dL — ABNORMAL LOW (ref 8.4–10.5)
Calcium: 8.3 mg/dL — ABNORMAL LOW (ref 8.4–10.5)
Creatinine, Ser: 1.52 mg/dL — ABNORMAL HIGH (ref 0.4–1.5)
Creatinine, Ser: 1.72 mg/dL — ABNORMAL HIGH (ref 0.4–1.5)
GFR calc Af Amer: 55 mL/min — ABNORMAL LOW (ref >60)
GFR calc non Af Amer: 39 mL/min — ABNORMAL LOW (ref >60)
GFR calc non Af Amer: 43 mL/min — ABNORMAL LOW (ref >60)
GFR calc non Af Amer: 45 mL/min — ABNORMAL LOW (ref >60)
Glucose, Bld: 167 mg/dL — ABNORMAL HIGH (ref 70–99)
Sodium: 132 mEq/L — ABNORMAL LOW (ref 135–145)

## 2010-12-25 LAB — PROTIME-INR
INR: 1.31 (ref 0.00–1.49)
INR: 1.6 — ABNORMAL HIGH (ref 0.00–1.49)
INR: 1.76 — ABNORMAL HIGH (ref 0.00–1.49)
Prothrombin Time: 16.2 seconds — ABNORMAL HIGH (ref 11.6–15.2)
Prothrombin Time: 18.9 seconds — ABNORMAL HIGH (ref 11.6–15.2)
Prothrombin Time: 20.4 seconds — ABNORMAL HIGH (ref 11.6–15.2)

## 2010-12-25 LAB — CBC
HCT: 39.3 % (ref 39.0–52.0)
Hemoglobin: 10.3 g/dL — ABNORMAL LOW (ref 13.0–17.0)
MCH: 22 pg — ABNORMAL LOW (ref 26.0–34.0)
MCHC: 30.8 g/dL (ref 30.0–36.0)
MCHC: 33.1 g/dL (ref 30.0–36.0)
MCV: 70.1 fL — ABNORMAL LOW (ref 78.0–100.0)
Platelets: 210 10*3/uL (ref 150–400)
Platelets: 219 10*3/uL (ref 150–400)
Platelets: 227 10*3/uL (ref 150–400)
RBC: 4.5 MIL/uL (ref 4.22–5.81)
RBC: 4.71 MIL/uL (ref 4.22–5.81)
RBC: 5.61 MIL/uL (ref 4.22–5.81)
RDW: 15.8 % — ABNORMAL HIGH (ref 11.5–15.5)
RDW: 15.9 % — ABNORMAL HIGH (ref 11.5–15.5)
WBC: 8.1 10*3/uL (ref 4.0–10.5)
WBC: 9.7 10*3/uL (ref 4.0–10.5)

## 2010-12-25 LAB — COMPREHENSIVE METABOLIC PANEL
ALT: 19 U/L (ref 0–53)
BUN: 21 mg/dL (ref 6–23)
Calcium: 9.4 mg/dL (ref 8.4–10.5)
Creatinine, Ser: 1.55 mg/dL — ABNORMAL HIGH (ref 0.4–1.5)
GFR calc Af Amer: 54 mL/min — ABNORMAL LOW (ref >60)
GFR calc non Af Amer: 44 mL/min — ABNORMAL LOW (ref >60)
Potassium: 4.8 mEq/L (ref 3.5–5.1)
Sodium: 139 mEq/L (ref 135–145)

## 2010-12-25 LAB — URINALYSIS, ROUTINE W REFLEX MICROSCOPIC
Bilirubin Urine: NEGATIVE
Glucose, UA: NEGATIVE mg/dL
Ketones, ur: NEGATIVE mg/dL
Nitrite: NEGATIVE
Protein, ur: NEGATIVE mg/dL
Specific Gravity, Urine: 1.022 (ref 1.005–1.030)
Urobilinogen, UA: 1 mg/dL (ref 0.0–1.0)

## 2010-12-25 LAB — TYPE AND SCREEN: Antibody Screen: NEGATIVE

## 2010-12-25 LAB — SURGICAL PCR SCREEN
MRSA, PCR: NEGATIVE
Staphylococcus aureus: NEGATIVE

## 2010-12-25 LAB — APTT: aPTT: 37 seconds (ref 24–37)

## 2010-12-26 ENCOUNTER — Ambulatory Visit (HOSPITAL_COMMUNITY)
Admission: RE | Admit: 2010-12-26 | Discharge: 2010-12-26 | Disposition: A | Discharge status: Home / Self Care | Payer: Medicare Other | Source: Ambulatory Visit | Attending: Urology | Admitting: Urology

## 2010-12-26 ENCOUNTER — Ambulatory Visit (HOSPITAL_COMMUNITY): Payer: Medicare Other

## 2010-12-26 DIAGNOSIS — N400 Enlarged prostate without lower urinary tract symptoms: Secondary | ICD-10-CM | POA: Insufficient documentation

## 2010-12-26 DIAGNOSIS — I2510 Atherosclerotic heart disease of native coronary artery without angina pectoris: Secondary | ICD-10-CM | POA: Insufficient documentation

## 2010-12-26 DIAGNOSIS — N201 Calculus of ureter: Secondary | ICD-10-CM | POA: Insufficient documentation

## 2010-12-26 DIAGNOSIS — M129 Arthropathy, unspecified: Secondary | ICD-10-CM | POA: Insufficient documentation

## 2010-12-26 DIAGNOSIS — I10 Essential (primary) hypertension: Secondary | ICD-10-CM | POA: Insufficient documentation

## 2010-12-26 DIAGNOSIS — M109 Gout, unspecified: Secondary | ICD-10-CM | POA: Insufficient documentation

## 2011-02-21 NOTE — Op Note (Signed)
NAMEJASSEN, SARVER              ACCOUNT NO.:  [id number]   MEDICAL RECORD NO.:  [id number]          PATIENT TYPE:  AMB   LOCATION:  DSC                          FACILITY:  [facility]   PHYSICIAN:  Katy Fitch. Sypher, M.D. 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   OPERATION:  1. Release of right index finger A1 pulley and synovectomy of      superficialis and profundus tendons.  2. Through separate incision, release of left index finger A1 pulley      with formal synovectomy.  3. Release of left ring and small finger A1 pulleys through separate      incision.   OPERATING SURGEON:  Katy Fitch. Sypher, M.D.   ASSISTANT:  Molly Maduro Dasnoit PA-C.   ANESTHESIA:  2% lidocaine synovial sheath block and palmar block of  right index, left index, ring and small finger supplemented by IV  sedation.   SUPERVISING ANESTHESIOLOGIST:  Bedelia Person, M.D.   INDICATIONS:  [name] is a 70 year old gentleman referred through  the courtesy of Dr. Valetta Mole. Swords for evaluation and management of  chronic stenosing tenosynovitis.  He has type 2 diabetes.  In our  initial office consultation we noted stenosing tenosynovitis of his left  index and small fingers and his right index finger.  We initially  planned on proceeding with bilateral surgery.  Between the time of  evaluation in the office and presentation to the operating room.  He  also developed tight stenosing tenosynovitis of his left ring finger,  therefore this finger was added to the permit with Mr. Freeburg's  witnessed informed consent.   After Dr. Gypsy Balsam interviewed him in the holding area, general sedation  and local block of the fingers was recommended.   Questions were invited and answered in detail.   We decided to use Dermabond on the right hand wound to facilitate early  activities of daily living and self-care while maintaining a sealed  wound environment.   PROCEDURE:  Cleburne L. Gut was brought to the operating room and  placed in the supine position upon the operating table.   Following sedation, the right and left arms were prepped with Betadine  soap and solution and sterilely draped.  A pneumatic tourniquet was  applied to the proximal right brachium.  A Esmarch bandage will be used  on the left side.   Following exsanguination of the right arm with Esmarch bandage, the  arterial tourniquet was inflated to 260 mmHg due to mild systolic  hypertension.   We infiltrated 2% lidocaine in the path of the intended incision and  flexor sheath.   When anesthesia was satisfactory, a short  transverse incision was  fashioned in the line of the distal palmar crease.  Subcutaneous tissues  were carefully divided taking care to identify the neurovascular  bundles.  These were gently retracted with blunt retractors followed by  isolation of the A1 pulley.   There was a more than 1 cm long cuff of fibrotic tenosynovium proximal  to the A1 pulley.   The pulley was isolated and split along its radial border and the  tendons delivered.   A formal tenosynovectomy of the superficialis and profundus tendons was  accomplished with scissors and a fine rongeur.  Thereafter, free range  of motion of the index finger was recovered.  The wound was then  repaired with intradermal 3-0 Prolene.  A very small Steri-Strip was  applied over the wound followed by sealing the wound with Dermabond to  facilitate early activities of  daily living.   The wound was ultimately dressed with sterile Xeroflo, sterile gauze and  an Ace wrap.  The tourniquet was released with immediate capillary  refill.   Attention was then directed to the left arm.  The hand, wrist and distal  forearm were exsanguinated with an Esmarch bandage that was placed on  the proximal forearm as a tourniquet.   After checking for sensory absence, the ring and small finger A1 pulleys  were dressed with single incision centered between the two flexor  sheaths.  Subcutaneous tissues were carefully divided taking care to  identify and track the common digital nerves and the ulnar proper  digital nerve of the small finger.  The A1 pulley of the small finger  was isolated, split with scissors followed by recovery of free passive  motion of the small finger.  The ring finger A1 pulley was similar  isolated, taking care to retract the common digital vessels and nerves,  followed by release of the A1 pulley along its radial border.  Free  range of motion recovered.  The wound was repaired with intradermal 3-0  Prolene and Steri-Strips.  Attention was then directed to the left index  finger.  A transverse incision was fashioned directly over the palpably  thickened tenosynovial fold.  Subcutaneous tissues were carefully  divided taking care to identify the A1 pulley and retract the  neurovascular bundles.  The A1 pulley was split in its midline, tendons  delivered and a formal tenosynovectomy of the superficialis and  profundus tendons accomplished.   This wound was then repaired with intradermal 3-0 Prolene suture.  Once  again a Steri-Strip was applied.   The wound was dressed with sterile gauze and an Ace wrap.  There were no  apparent complications.  Mr. Dombek tolerated the surgery and  anesthesia well.  He was transferred to the recovery room with stable  vital signs.   He will be discharged home to the care of his family with prescriptions   for Vicodin 5 mg one or two tablets p.o. q.4-6h. p.r.n.  pain.  He is  also provided a prescription for doxycycline 100 mg one p.o. b.i.d. x4  days as prophylactic antibiotic.      Katy Fitch Sypher, M.D.  Electronically Signed     RVS/MEDQ  D:  08/06/2007  T:  08/06/2007  Job:  [id number]

## 2011-02-24 NOTE — Op Note (Signed)
[name], [name]              ACCOUNT NO.:  [id number]   MEDICAL RECORD NO.:  [id number]          PATIENT TYPE:  AMB   LOCATION:  DAY                          FACILITY:  [facility]   PHYSICIAN:  Ronald L. Earlene Plater, M.D.  DATE OF BIRTH:  08/23/1941   DATE OF PROCEDURE:  03/08/2006  DATE OF DISCHARGE:                                 OPERATIVE REPORT   PREOPERATIVE DIAGNOSIS:  Left ureterolithiasis and bladder stones.   POSTOPERATIVE DIAGNOSIS:  Left ureterolithiasis and bladder stones.   OPERATION PERFORMED:  Cystourethroscopy, removal of bladder stones, left  retrograde ureteropyelogram with placement of left double-J stent.   SURGEON:  Lucrezia Starch. Earlene Plater, M.D.   ANESTHESIA:  General endotracheal.   ESTIMATED BLOOD LOSS:  Negligible.   TUBES:  28 cm 7 French Contour double pigtail stent.   COMPLICATIONS:  None.   INDICATIONS FOR PROCEDURE:  Ed is a very nice 70 year old white male who has  known benign prostatic hypertrophy.  He has had kidney stones in the past  but he came in asymptomatic for a check and was found to have significant  microhematuria.  NMP22 was negative.  However, on a CT urogram he was found  to have four 1 mm calculi within the left kidney, minimal left  hydronephrosis secondary to a 9 x 6 mm stone at the ureteropelvic junction  and a 5 mm stone in the left distal ureter.  He also had gallstones.  After  understanding risks, benefits and alternatives, he has elected to proceed  with the above procedure.  He has had manipulation previously and we knew  with enlargement prostate it was very difficult to access the ureter.   DESCRIPTION OF PROCEDURE:  The patient was placed in supine position.  After  proper general endotracheal anesthesia, he was placed in the dorsal  lithotomy position.  Cystourethroscopy was performed with a 22.5 [place]  Olympus panendoscope.  He was noted to have significant trilobar hypertrophy  with a median bar and obviously very difficult  to see ureteral orifices.  The bladder had multiple small stones within it, up to 5 to 6 mm and these  were irrigated clear and will be given to the patient.  Remainder of the  bladder had grade 1 to 2 trabeculation and was otherwise without lesions.  The orifice could only be seen with a 70 degree lens and utilizing Albarrans  bridge, it was visualized under direct vision.  A Glide wire was placed.  A  6 French open ended catheter was placed over the Glide wire.  He was then  placed in Trendelenburg position and the 6 [place] open ended catheter was  placed into the kidney.  Retrograde ureteropyelogram was then performed.  There appeared to be a stone in the upper ureter.  It was difficult to see  on x-ray but it appeared to look like a stone filling defect.  No other  obvious stones were noted although it was difficult to see in the distal  ureter.  A 6 French sensor wire was placed in the left kidney.  Under  fluoroscopic guidance a 28  cm 7 [employer] double  pigtail stent was placed and noted to be in good position within the left  renal pelvis and within the bladder.  No string was attached.  Reinspection  revealed  there were no significant calculi within the bladder.  Plan would be to re-x-  ray him and CT him and if possible, perform ESWL if there are further stones  up or once the ureter is dilated potentially perform flexible and rigid  ureteroscopy.      Ronald L. Earlene Plater, M.D.  Electronically Signed     RLD/MEDQ  D:  03/08/2006  T:  03/08/2006  Job:  [id number]

## 2011-02-24 NOTE — Op Note (Signed)
[name], [name]              ACCOUNT NO.:  [id number]   MEDICAL RECORD NO.:  [id number]          PATIENT TYPE:  INP   LOCATION:  0009                         FACILITY:  [facility]   PHYSICIAN:  [name], M.D.  DATE OF BIRTH:  09-11-1941   DATE OF PROCEDURE:  03/28/2006  DATE OF DISCHARGE:                                 OPERATIVE REPORT   DIAGNOSIS:  Benign prostatic hypertrophy with severe obstruction, recent  bladder stones, left upper ureteral and lower ureteral calculi with prior  double-J stent.   OPERATIVE PROCEDURE:  Cystourethroscopy, left retrograde ureteral pyelogram,  left flexible ureterorenoscopy, holmium laser lithotripsy of upper and lower  ureteral calculi, removal and placement of double-J stent, TURP.   SURGEON:  Lucrezia Starch. Earlene Ortiz, M.D.   ANESTHESIA:  General endotracheal.   ESTIMATED BLOOD LOSS:  200 cc.   TUBES:  A 28-cm, 6-French, Cook double-pigtail stent, and 26-French, 30 cc  balloon Foley catheter.   INDICATION FOR PROCEDURE:  Mr. Maury Dus is a very nice 70 year old white  male who presented with calculi within his kidney, in his bladder and in his  lower ureter with obstruction.  He is also known to have significant BPH  with significant outflow obstructive symptoms, and has been maintained on  Proscar and Flomax, but, again, the symptoms have been progressive.  At  cystoscopy previously, he was noted to have a severely J-hooked ureter, very  difficult to access, and multiple stones in the bladder which were removed.  A double-J stent was placed into the left ureter, and he subsequently had an  ESL of the left intrarenal calculus.  He now presents for definitive  treatment of the BPH and the stones.   PROCEDURE IN DETAIL:  The patient was placed in the supine position.  After  appropriate general endotracheal anesthesia, he was placed in the dorsal  lithotomy position, prepped and draped with Betadine in a sterile fashion.  Cystourethroscopy  was performed with a 22.5-French Olympus PN endoscope.  He  was noted to have severe trilobar hypertrophy, large median bar, grade 2  trabeculation of the bladder.  The stent was difficult to visualize, but was  visualized and pulled to the meatus, and, under direct vision, a 0.03-French  sensory wire was placed into the left renal pelvis.  With some difficulty, a  6-French, open-ended catheter was placed into the left renal pelvis, and  hydronephrotic drip was noted, and dye was injected, and there was a normal  collecting system with some hydronephrosis noted.  Am Amplatz super-stiff,  0.38-French wire was placed into the left kidney, and was left in place with  open-ended was removed.  Attempts using a semirigid ureteroscope, due to  angulation, was impossible.  Therefore, the ureter was dilated with the  inner dilator sheath of ureteral access catheter, and a dual lumen inserter  was inserted to the left renal pelvis, and a sensor core wire was placed  adjacent as a safety wire.  Next, the flexible ureteroscope was passed over  the Amplatz super-stiff wire into the left renal pelvis.  The pelvis was  examined and  noted to be really stone-free at this point, but just below the  UPJ was an approximately 5 mm stone.  Utilizing the 225 micron laser fiber  and holmium laser, it was fragmented into multiple fragments.  It was felt  that it probably was not safe to try to basket the fragments out, but they  were all less than 1 mm.  The remainder of the ureter was visualized, and  there was another 5-6 mm stone in the distal ureter, near the ureteral  orifice, again with severe angulation, and, again with the 220 micron laser  fiber, this was fragmented into multiple small fragments, less than 1 mm  each, and left in place.  Under fluoroscopic guidance, a 28-cm, 6-French,  Cook double-pigtail stent without a pull-out string was placed and noted to  curl well in the left renal pelvis and within  the bladder.  Next, the scope  was removed, the urethra was calibrated to 30-French with Sissy Hoff sounds,  and a 28-French, continuous flow, resectoscope sheath was placed over a  Timberlake obturator.  Transurethral resection of the prostate was then  performed, after the bladder had been inspected with 12-degree and 70-degree  lenses.  The resection was begun at the 6 o'clock position and resection was  carried out from the bladder neck to the verumontanum.  The prostate was  noted to be quite large and vascular.  Resection was carried down to the  surgical capsule, avoiding the external sphincter, the verumontanum and the  trigone.  Next, the left lobe of the prostate was serially resected to the  surgical capsule, as was the right lobe.  Apical tissue was voluminous and  was approached and serially resected.  Overhang curtain tissue at the 12  o'clock position was then serially resected to the surgical capsule, and  chips were evacuated with Toomey syringe.  Re-inspection of the bladder  revealed that no significant chips were present, prostate was widely  resected, the capsule was intact, and good hemostasis was noted to be  present.  The resectoscope sheath was officially removed and a 26-French, 30-  cc balloon Foley catheter was passed and bladder was noted to irrigate  clear.  Chips were submitted to pathology and the patient was taken to the  recovery room, stable.      [name], M.D.  Electronically Signed     RLD/MEDQ  D:  03/28/2006  T:  03/28/2006  Job:  [id number]

## 2011-02-24 NOTE — Op Note (Signed)
NAMEHUDSON, [name]              ACCOUNT NO.:  [id number]   MEDICAL RECORD NO.:  [id number]          PATIENT TYPE:  OBV   LOCATION:  0098                         [facility]ACILITY:  [facility]   PHYSICIAN:  Lucrezia Starch. Earlene Plater, M.D.  DATE OF BIRTH:  [birth year]   DATE OF PROCEDURE:  02/17/2005  DATE OF DISCHARGE:                                 OPERATIVE REPORT   PREOPERATIVE DIAGNOSES:  Right ureterolithiasis and cystolithiasis.   POSTOPERATIVE DIAGNOSES:  Right ureterolithiasis and cystolithiasis.   OPERATION:  Cystourethroscopy, right retrograde ureteropyelogram, right  ureteroscopy and placement of right double-J stent.   SURGEON:  Lucrezia Starch. Earlene Plater, M.D.   ANESTHESIA:  LMA.   ESTIMATED BLOOD LOSS:  Negligible.   TUBES:  26 cm 6 [place] Cook double pigtail stent.   COMPLICATIONS:  None.   INDICATIONS FOR PROCEDURE:  [name] is a very nice 70 year old white  male who has known BPH, bladder neck obstructive symptoms who presented with  microhematuria. On CT scan, he was found to have a 7 mm nonobstructive right  distal stone just proximal to the bladder, calcification of the right and  left bladder base and bilateral nonobstructing renal calculi with some left  renal cyst. After understanding the risks, benefits, and alternatives, he  has elected to proceed with Cysto and stone manipulation.   DESCRIPTION OF PROCEDURE:  The patient was placed in supine position and  after proper LMA anesthesia was placed in the dorsal lithotomy position,  prepped and draped with Betadine in a sterile fashion. Cystourethroscopy was  performed with a 22.5 [place] Olympus panendoscope. He was noted to have  significant trilobar hypertrophy with a large median bar and grade 1-2  trabeculations of the bladder but no lesions were noted in the bladder. The  ureteral orifices were in normal location. The right ureteral orifice was  cannulated with a 6 [place] open ended catheter that was noted to be highly J  hooked due to the large BPH in the median bar. Dye was injected and he was  noted to have a significant filling defect corresponding with a 7-8 mm right  distal ureteral stone. There was a small jet of dye by it but it was very  narrow. Utilizing a Glidewire, the area was bypassed and attempts with the 6  [place] open ended due to the highly impacted nature of the stone, it could  not be passed. Therefore a 5 [place] open ended was used to get barely pass  the stone. The Glidewire was removed and an Amplatz super stiff wire was  placed and it was able to be passed into the right renal pelvis. Dye was  injected, no perforations noted. Some hydronephrosis noted. The 5 [place]  open ended was then changed from a 6 [place] and the area although it was  very tight was able to be bypassed. The distal ureter was then balloon  dilated over the Amplatz guidewire. I utilized a 4 cm 5 mm balloon dilator  dilated to 10 atmospheres of pressure for 2 minutes and there was no  narrowing noted on it. It was removed and ureteroscopy  was attempted and  again due to the large nature of the prostate and the fact that the stone  was highly impacted and ureter was inflamed we could get up to the stone but  really could not see it well enough to attempt laser. Therefore a 26 cm 6  [place] Cook double pigtail stent was placed and noted to be in good  position in the right renal pelvis in the bladder, no pull out string was  attached. The bladder was drained, the panendoscope was removed, stent was  placed in position. The plan is to come back in approximately two weeks once  the area is dilated for attempted ureteroscopy.      RLD/MEDQ  D:  02/17/2005  T:  02/17/2005  Job:  [id number]

## 2011-02-24 NOTE — Discharge Summary (Signed)
NAMEISRAEL, WERTS              ACCOUNT NO.:  [id number]   MEDICAL RECORD NO.:  [id number]          PATIENT TYPE:  INP   LOCATION:  1405                         [facility]ACILITY:  [facility]   PHYSICIAN:  Ronald L. Earlene Plater, M.D.  DATE OF BIRTH:  [birth year]   DATE OF ADMISSION:  03/28/2006  DATE OF DISCHARGE:  03/30/2006                                 DISCHARGE SUMMARY   DIAGNOSES:  Benign prostatic hypertrophy with significant stone disease in  the bladder within the kidney.   OPERATIVE PROCEDURE:  Cystourethroscopy, left retrograde ureteropyelogram,  left flexible ureteroscopy, Homian laser lithotripsy of upper and lower  ureteral calculi, and removal and placement of left double-J stent, and  TURP.   HISTORY OF PRESENT ILLNESS:  A very nice 70 year old white male who  presented with significant BPH obstruction, J-hooking of the ureters,  ureterolithiasis, nephrolithiasis, and cystolithiasis.  He subsequently has  undergone removal of the bladder stones and placement of left double J  stent.  He had ESWL.  He is for admission and the above procedure.   PAST MEDICAL HISTORY:  Please see history and physical for full details.   SOCIAL HISTORY:  Please see history and physical for full details.   FAMILY HISTORY:  Please see history and physical for full details.   REVIEW OF SYSTEMS:  Please see history and physical for full details.   PHYSICAL EXAMINATION:  VITAL SIGNS:  He is afebrile, vital signs stable.  GENERAL:  Well-developed, well-nourished, well-groomed, oriented x3.  HEENT:  Normal.  NECK:  Without mass or thyromegaly.  CHEST:  Normal.  ABDOMEN:  Soft, nontender, without masses, organomegaly, or hernias.  EXTREMITIES:  Normal.  NEUROLOGIC:  Intact.  SKIN:  Normal.  GENITOURINARY:  Penis, meatus, scrotum, testicles, adnexa, anus, and  perineum are normal.  RECTAL:  Prostate is 40 g, smooth, symmetrical, and benign.   HOSPITAL COURSE:  The patient was admitted.  After  undergoing proper  preoperative evaluation, was taken subsequently to surgery and underwent the  above surgery.  Immediately postoperatively he was doing well.  Hemoglobin  was 11.4, hematocrit 35.6, white blood cell count was 5.9.  BMET was  essentially normal.  On postoperative day #1, he was afebrile.  Urine was  clearing.  Abdomen was soft.  Foley catheter was discontinued on March 30, 2006.  He voided x2 and was discharged for followup.   DISCHARGE MEDICATIONS:  1.  Vicodin.  2.  Septra.  3.  Colace.   Discharge instructions were given.   He was to return the following Tuesday for evaluation and will subsequently  have removal of his stent.      Ronald L. Earlene Plater, M.D.  Electronically Signed     RLD/MEDQ  D:  04/21/2006  T:  04/21/2006  Job:  [id number]

## 2011-02-24 NOTE — Op Note (Signed)
NAMEJERNARD, REIBER              ACCOUNT NO.:  [id number]   MEDICAL RECORD NO.:  [id number]          PATIENT TYPE:  AMB   LOCATION:  DAY                          FACILITY:  [facility]   PHYSICIAN:  Maretta Bees. Vonita Moss, M.D.DATE OF BIRTH:  January 03, 1941   DATE OF PROCEDURE:  03/01/2005  DATE OF DISCHARGE:                                 OPERATIVE REPORT   PREOPERATIVE DIAGNOSES:  1.  Right ureteral calculus.  2.  Benign prostatic hypertrophy.   POSTOPERATIVE DIAGNOSES:  1.  Right ureteral calculus.  2.  Benign prostatic hypertrophy.   PROCEDURE:  Cystoscopy, right retrograde pyelogram with interpretation,  right ureteroscopy and laser fragmentation of  distal ureteral stone and  right JJ catheter placement.   SURGEON:  Dr. Larey Dresser.   ANESTHESIA:  General.   INDICATIONS:  This is a 70 year old gentleman who has had a past history of  stone disease and on Feb 17, 2005, Dr. Earlene Plater performed a cysto, right  retrograde pyelogram, and placement of a right double-J stent. This was  apparently very difficult because of his very large prostate with a high  riding bladder neck and a J hooking to the ureter. The stone on CT was  measured to be a 7-8 mm in size, and after getting up the double J, Dr.  Earlene Plater did not have as good of access as he felt he needed to remove the  stone, plus it was highly impacted. It was decided to leave in the double-J  catheter for a couple weeks and come back today for definitive therapy of  the stone. He also had some postoperative voiding problems that are better  now on Proscar, Flomax, and Cardura.   PROCEDURE:  The patient was brought to the operating room and placed in  lithotomy position. External genitalia were prepped and draped in usual  fashion. He was cystoscoped and indeed he has a very large prostate with a  high riding bladder neck in the orifice.  It is difficult to visualize. I  identified the previously placed double-J catheter and pulled  it out through  the tip of the penis.  With the proximal end of the wire still up the  ureter, I was able in to insert a guidewire and then remove the double-J  catheter. With the guidewire in place, I inserted the ureteroscope which was  difficult to manipulate through the prostate. The ureter was somewhat angled  but I was able to negotiate my way up to the stone which was slightly  irregular, colored black and white. It was an unusual looking stone. I then  inserted the holmium laser probe and although the stone was fairly hard, I  was able to break the stone into multiple small pieces.  I tried to basket  these pieces, but it was hard make the angle in the distal ureter with the  basket. No stones were really retrieved because of that. I then reinserted  the ureteroscope. Again it was very difficult to get through the prostate  and I felt that multiple attempts to pull out stones and trying to  negotiation  this prostate may be more harmful than worthwhile. Therefore  once I was back up in the ureter, I used the holmium laser to again fragment  any questionable sized stone fragments and the smaller pieces so they would  very likely pass spontaneously. At this point, I did a retrograde pyelogram,  confirming the position and location of the renal collecting system on the  right.  There was no evidence of extravasation or filling defects. I then  back loaded the guidewire through the cystoscope and inserted a 6-French 26-  cm double-J with the string attached and there was a  full coil in the renal pelvis and a full coil in the bladder and the string  brought out per urethra and taped to the top of the penis. An 18-French  Foley catheter was inserted and connected to close drainage. He was then  taken to recovery room in good condition, having tolerated the procedure  well with no significant blood loss.     _________________    LJP/MEDQ  D:  03/01/2005  T:  03/01/2005  Job:   [id number]

## 2011-02-24 NOTE — H&P (Signed)
NAMEKAZI, REPPOND              ACCOUNT NO.:  [id number]   MEDICAL RECORD NO.:  [id number]          PATIENT TYPE:  INP   LOCATION:  1405                         FACILITY:  [facility]   PHYSICIAN:  Ronald L. Earlene Plater, M.D.  DATE OF BIRTH:  1941/08/17   DATE OF ADMISSION:  03/28/2006  DATE OF DISCHARGE:  03/30/2006                                HISTORY & PHYSICAL   CHIEF COMPLAINT:  I have stones in my kidney and I'm having difficulty  urinating.   HISTORY OF PRESENT ILLNESS:  Mr. Stoneking is a very nice 70 year old white  male who presented with a calculi in his left kidney and in his bladder and  in his lower ureter with obstruction.  He is also known to have significant  BPH, significant outflow obstructive symptoms, been maintained on Proscar  and Flomax.  He was also noted to have a significant J-hooking of his  ureters and very difficult access and multiple stones within his bladder  which were removed.  He had a left double J stent placed and subsequently  had an ESWL of the left intrarenal calculus.  He now presented for  definitive management of his benign prostatic enlargement and his stones.   ALLERGIES:  CIPRO.   MEDICATIONS:  Please see reconciliation.   PAST MEDICAL HISTORY:  He is really healthy.  He has sleep apnea, has had  arthritis with some joint pain.  He has had stone disease as noted.   PAST SURGICAL HISTORY:  1.  Lithotripsies in the past.  2.  Cystoscopies.  3.  Carpal tunnel syndrome.  4.  Total knee replacement.  5.  Hernia repair.  Otherwise, been healthy and active.   SOCIAL HISTORY:  He has an occasional beer.  He is a negative smoker.   FAMILY HISTORY:  Non-significant.   REVIEW OF SYSTEMS:  No shortness of breath, dyspnea on exertion, chest pain,  or GI complaints.  He does have some blood in his urine and some discomfort  as noted.   PHYSICAL EXAMINATION:  VITAL SIGNS:  Blood pressure is 144/70, pulse 67,  respirations 20, temperature 97.3  degrees Fahrenheit.  GENERAL:  He is well-developed, well-nourished, in no acute distress.  Oriented x3.  HEENT:  Normal.  NECK:  Without masses or thyromegaly.  CHEST:  Normal.  ABDOMEN:  Soft, nontender, without masses, organomegaly, or hernias.  EXTREMITIES:  Normal.  NEUROLOGIC:  Intact.  SKIN:  Normal.  GENITOURINARY:  Penis, meatus, scrotum, testicles, adnexa, anus, perineum  are normal.  RECTAL:  Prostate is 40 g, smooth, symmetrical, and benign.   IMPRESSION:  1.  Benign prostatic hypertrophy with significant voiding symptoms.  2.  Recent bladder stones.  3.  Left ureteral nephrolithiasis.   PLAN:  Admit.  Proceed with surgical repair of the above mentioned problems.      Ronald L. Earlene Plater, M.D.  Electronically Signed     RLD/MEDQ  D:  04/21/2006  T:  04/21/2006  Job:  [id number]

## 2011-03-16 ENCOUNTER — Other Ambulatory Visit: Payer: Self-pay | Admitting: Internal Medicine

## 2011-05-24 ENCOUNTER — Other Ambulatory Visit: Payer: Self-pay | Admitting: Internal Medicine

## 2011-06-06 ENCOUNTER — Encounter: Payer: Self-pay | Admitting: Internal Medicine

## 2011-06-06 ENCOUNTER — Ambulatory Visit (INDEPENDENT_AMBULATORY_CARE_PROVIDER_SITE_OTHER): Payer: Medicare Other | Admitting: Internal Medicine

## 2011-06-06 ENCOUNTER — Ambulatory Visit: Payer: Private Health Insurance - Indemnity | Admitting: Internal Medicine

## 2011-06-06 DIAGNOSIS — G4733 Obstructive sleep apnea (adult) (pediatric): Secondary | ICD-10-CM

## 2011-06-06 DIAGNOSIS — R972 Elevated prostate specific antigen [PSA]: Secondary | ICD-10-CM

## 2011-06-06 DIAGNOSIS — R5381 Other malaise: Secondary | ICD-10-CM

## 2011-06-06 DIAGNOSIS — I10 Essential (primary) hypertension: Secondary | ICD-10-CM

## 2011-06-06 DIAGNOSIS — E119 Type 2 diabetes mellitus without complications: Secondary | ICD-10-CM

## 2011-06-06 LAB — CBC WITH DIFFERENTIAL/PLATELET
Basophils Absolute: 0 K/uL (ref 0.0–0.1)
Basophils Relative: 0.5 % (ref 0.0–3.0)
Eosinophils Absolute: 0.1 K/uL (ref 0.0–0.7)
Eosinophils Relative: 2 % (ref 0.0–5.0)
HCT: 42.9 % (ref 39.0–52.0)
Hemoglobin: 13.8 g/dL (ref 13.0–17.0)
Lymphocytes Relative: 22 % (ref 12.0–46.0)
Lymphs Abs: 1.5 K/uL (ref 0.7–4.0)
MCHC: 32 g/dL (ref 30.0–36.0)
MCV: 69.9 fl — ABNORMAL LOW (ref 78.0–100.0)
Monocytes Absolute: 1 K/uL (ref 0.1–1.0)
Monocytes Relative: 14.5 % — ABNORMAL HIGH (ref 3.0–12.0)
Neutro Abs: 4.1 K/uL (ref 1.4–7.7)
Neutrophils Relative %: 61 % (ref 43.0–77.0)
Platelets: 201 K/uL (ref 150.0–400.0)
RBC: 6.14 Mil/uL — ABNORMAL HIGH (ref 4.22–5.81)
RDW: 17.5 % — ABNORMAL HIGH (ref 11.5–14.6)
WBC: 6.8 K/uL (ref 4.5–10.5)

## 2011-06-06 LAB — HEPATIC FUNCTION PANEL
ALT: 29 U/L (ref 0–53)
AST: 26 U/L (ref 0–37)
Albumin: 4.3 g/dL (ref 3.5–5.2)
Alkaline Phosphatase: 72 U/L (ref 39–117)
Bilirubin, Direct: 0.2 mg/dL (ref 0.0–0.3)
Total Bilirubin: 1.2 mg/dL (ref 0.3–1.2)
Total Protein: 6.9 g/dL (ref 6.0–8.3)

## 2011-06-06 LAB — BASIC METABOLIC PANEL
BUN: 21 mg/dL (ref 6–23)
Chloride: 105 mEq/L (ref 96–112)
Creatinine, Ser: 1.1 mg/dL (ref 0.4–1.5)
GFR: 73.69 mL/min (ref >60.00)

## 2011-06-06 LAB — PSA: PSA: 1.53 ng/mL (ref 0.10–4.00)

## 2011-06-06 LAB — LIPID PANEL
Cholesterol: 155 mg/dL (ref 0–200)
HDL: 33.3 mg/dL — ABNORMAL LOW
LDL Cholesterol: 97 mg/dL (ref 0–99)
Total CHOL/HDL Ratio: 5
Triglycerides: 122 mg/dL (ref 0.0–149.0)
VLDL: 24.4 mg/dL (ref 0.0–40.0)

## 2011-06-06 LAB — TSH: TSH: 1.37 u[IU]/mL (ref 0.35–5.50)

## 2011-06-06 LAB — HEMOGLOBIN A1C: Hgb A1c MFr Bld: 6.9 % — ABNORMAL HIGH (ref 4.6–6.5)

## 2011-06-06 MED ORDER — ALLOPURINOL 100 MG PO TABS: 100.0000 mg | ORAL_TABLET | Freq: Every day | ORAL | Status: DC

## 2011-06-12 NOTE — Progress Notes (Signed)
  Subjective:    Patient ID: [name], male    DOB: [birth year], 70 y.o.   MRN: [id number]  HPI  patient comes in for followup of multiple medical problems including type 2 diabetes, hyperlipidemia, hypertension. The patient does not check blood sugar or blood pressure at home. The patetient does not follow an exercise or diet program. The patient denies any polyuria, polydipsia.  In the past the patient has gone to diabetic treatment center. The patient is tolerating medications  Without difficulty. The patient does admit to medication compliance.    Past Medical History  Diagnosis Date  . Obstructive sleep apnea   . Diabetes mellitus type II   . Gout   . Hypertension   . Nephrolithiasis     hx of  . Benign prostatic hypertrophy   . Rosacea, acne   . Partial Achilles tendon tear   . Erectile dysfunction   . Elevated PSA    Past Surgical History  Procedure Date  . Carpal tunnel release 2004  . Inguinal hernia repair   . Transurethral resection of prostate   . Tonsillectomy and adenoidectomy   . Hand surgery 2007    right hand  . Knee surgery 1961    left knee  . Joint replacement     left total knee    reports that he has never smoked. He does not have any smokeless tobacco history on file. His alcohol and drug histories not on file. family history includes Thalassemia in his son. Allergies  Allergen Reactions  . Ciprofloxacin     REACTION: Diarrhea,l rectal irritation      Review of Systems  patient denies chest pain, shortness of breath, orthopnea. Denies lower extremity edema, abdominal pain, change in appetite, change in bowel movements. Patient denies rashes, musculoskeletal complaints. No other specific complaints in a complete review of systems.      Objective:   Physical Exam  well-developed well-nourished male in no acute distress. HEENT exam atraumatic, normocephalic, neck supple without jugular venous distention. Chest clear to auscultation cardiac exam  S1-S2 are regular. Abdominal exam overweight with bowel sounds, soft and nontender. Extremities no edema. Neurologic exam is alert with a normal gait.        Assessment & Plan:

## 2011-06-12 NOTE — Assessment & Plan Note (Signed)
Needs regular followup. Check laboratory work today.

## 2011-06-12 NOTE — Assessment & Plan Note (Signed)
Tolerating CPAP 

## 2011-06-12 NOTE — Assessment & Plan Note (Signed)
BP Readings from Last 3 Encounters:  06/06/11 132/70  05/02/10 124/74  03/03/10 136/78   well controlled. Continue current medications.

## 2011-07-13 ENCOUNTER — Ambulatory Visit: Payer: Private Health Insurance - Indemnity | Admitting: Internal Medicine

## 2011-07-19 LAB — BASIC METABOLIC PANEL
BUN: 18
CO2: 23
Calcium: 9.1
Creatinine, Ser: 1.61 — ABNORMAL HIGH
GFR calc Af Amer: 52 — ABNORMAL LOW

## 2011-07-19 LAB — POCT HEMOGLOBIN-HEMACUE: Hemoglobin: 12.6 — ABNORMAL LOW

## 2011-08-15 ENCOUNTER — Other Ambulatory Visit: Payer: Self-pay | Admitting: Internal Medicine

## 2011-08-18 ENCOUNTER — Ambulatory Visit (INDEPENDENT_AMBULATORY_CARE_PROVIDER_SITE_OTHER): Payer: Medicare Other | Admitting: *Deleted

## 2011-08-18 DIAGNOSIS — Z23 Encounter for immunization: Secondary | ICD-10-CM

## 2011-08-18 MED ORDER — DILTIAZEM HCL ER COATED BEADS 240 MG PO CP24: 240.0000 mg | ORAL_CAPSULE | Freq: Every day | ORAL | Status: DC

## 2011-08-21 ENCOUNTER — Other Ambulatory Visit: Payer: Self-pay | Admitting: Dermatology

## 2011-10-17 ENCOUNTER — Encounter: Payer: Self-pay | Admitting: Internal Medicine

## 2011-10-17 ENCOUNTER — Ambulatory Visit (INDEPENDENT_AMBULATORY_CARE_PROVIDER_SITE_OTHER): Payer: Medicare Other | Admitting: Internal Medicine

## 2011-10-17 DIAGNOSIS — I10 Essential (primary) hypertension: Secondary | ICD-10-CM

## 2011-10-17 DIAGNOSIS — E119 Type 2 diabetes mellitus without complications: Secondary | ICD-10-CM

## 2011-10-17 MED ORDER — HYDROCODONE-ACETAMINOPHEN 5-500 MG PO TABS: 1.0000 | ORAL_TABLET | Freq: Three times a day (TID) | ORAL | Status: AC | PRN

## 2011-10-17 MED ORDER — ONDANSETRON HCL 8 MG PO TABS: 8.0000 mg | ORAL_TABLET | Freq: Three times a day (TID) | ORAL | Status: AC | PRN

## 2011-10-17 NOTE — Progress Notes (Signed)
  Subjective:    Patient ID: [name], male    DOB: Jul 26, 1941, 72 y.o.   MRN: [id number]  HPI  71 year old patient who has hypertension and diabetes. He was well until approximately 6 PM last night when he had the onset of diffuse midabdominal pain. This occurred shortly after eating at a fast food restaurant. There is some nausea and vomiting. Since the onset of his illness he has had 3 bowel movements the first well formed and soft bowel movements slightly loose. There's been no fever or chills. He continues to have nausea but no further vomiting. He continues to complain of mild crampy abdominal pain.    Review of Systems  Constitutional: Negative for fever, chills, appetite change and fatigue.  HENT: Negative for hearing loss, ear pain, congestion, sore throat, trouble swallowing, neck stiffness, dental problem, voice change and tinnitus.   Eyes: Negative for pain, discharge and visual disturbance.  Respiratory: Negative for cough, chest tightness, wheezing and stridor.   Cardiovascular: Negative for chest pain, palpitations and leg swelling.  Gastrointestinal: Positive for nausea, vomiting, abdominal pain, diarrhea and rectal pain. Negative for constipation, blood in stool and abdominal distention.  Genitourinary: Negative for urgency, hematuria, flank pain, discharge, difficulty urinating and genital sores.  Musculoskeletal: Negative for myalgias, back pain, joint swelling, arthralgias and gait problem.  Skin: Negative for rash.  Neurological: Negative for dizziness, syncope, speech difficulty, weakness, numbness and headaches.  Hematological: Negative for adenopathy. Does not bruise/bleed easily.  Psychiatric/Behavioral: Negative for behavioral problems and dysphoric mood. The patient is not nervous/anxious.        Objective:   Physical Exam  Constitutional: He is oriented to person, place, and time. He appears well-developed.       Afebrile. Appears well and in no acute  distress. Repeat blood pressure improved to 140/84  HENT:  Head: Normocephalic.  Right Ear: External ear normal.  Left Ear: External ear normal.  Eyes: Conjunctivae and EOM are normal.  Neck: Normal range of motion.  Cardiovascular: Normal rate and normal heart sounds.   Pulmonary/Chest: Breath sounds normal.  Abdominal: Soft. Bowel sounds are normal. He exhibits no distension and no mass. There is no tenderness. There is no rebound and no guarding.  Musculoskeletal: Normal range of motion. He exhibits no edema and no tenderness.  Neurological: He is alert and oriented to person, place, and time.  Psychiatric: He has a normal mood and affect. His behavior is normal.          Assessment & Plan:   Abdominal pain with nausea and vomiting and some diarrhea. Probable viral gastroenteritis. His clinical exam is unremarkable. Doubt food intolerance. Continue on a clear liquid diet for several hours today and advanced as tolerated. Will treat symptomatically. He will call if  there is not prompt clinical improvement

## 2011-10-17 NOTE — Patient Instructions (Signed)
Drink  plenty of  fluids, and advance your diet  slowly to solids as you feel improved.  If you are unable to keep anything down or  Show  signs of dehydration such as dry, cracked lips, or  not urinating, please notify our office.

## 2011-10-23 DIAGNOSIS — M19049 Primary osteoarthritis, unspecified hand: Secondary | ICD-10-CM | POA: Diagnosis not present

## 2011-10-23 DIAGNOSIS — G5600 Carpal tunnel syndrome, unspecified upper limb: Secondary | ICD-10-CM | POA: Diagnosis not present

## 2011-10-26 ENCOUNTER — Other Ambulatory Visit: Payer: Self-pay | Admitting: Orthopedic Surgery

## 2011-10-27 ENCOUNTER — Encounter (HOSPITAL_BASED_OUTPATIENT_CLINIC_OR_DEPARTMENT_OTHER): Payer: Self-pay | Admitting: *Deleted

## 2011-10-27 NOTE — Progress Notes (Signed)
To come in for [id number]  

## 2011-10-30 ENCOUNTER — Other Ambulatory Visit: Payer: Self-pay

## 2011-10-30 ENCOUNTER — Encounter (HOSPITAL_BASED_OUTPATIENT_CLINIC_OR_DEPARTMENT_OTHER)
Admission: RE | Admit: 2011-10-30 | Discharge: 2011-10-30 | Disposition: A | Discharge status: Home / Self Care | Payer: Medicare Other | Source: Ambulatory Visit | Attending: Orthopedic Surgery | Admitting: Orthopedic Surgery

## 2011-10-30 DIAGNOSIS — E119 Type 2 diabetes mellitus without complications: Secondary | ICD-10-CM | POA: Diagnosis not present

## 2011-10-30 DIAGNOSIS — N289 Disorder of kidney and ureter, unspecified: Secondary | ICD-10-CM | POA: Diagnosis not present

## 2011-10-30 DIAGNOSIS — G5600 Carpal tunnel syndrome, unspecified upper limb: Secondary | ICD-10-CM | POA: Diagnosis not present

## 2011-10-30 DIAGNOSIS — N400 Enlarged prostate without lower urinary tract symptoms: Secondary | ICD-10-CM | POA: Diagnosis not present

## 2011-10-30 DIAGNOSIS — G4733 Obstructive sleep apnea (adult) (pediatric): Secondary | ICD-10-CM | POA: Diagnosis not present

## 2011-10-30 DIAGNOSIS — Z87442 Personal history of urinary calculi: Secondary | ICD-10-CM | POA: Diagnosis not present

## 2011-10-30 DIAGNOSIS — Z01810 Encounter for preprocedural cardiovascular examination: Secondary | ICD-10-CM | POA: Diagnosis not present

## 2011-10-30 DIAGNOSIS — M109 Gout, unspecified: Secondary | ICD-10-CM | POA: Diagnosis not present

## 2011-10-30 DIAGNOSIS — I10 Essential (primary) hypertension: Secondary | ICD-10-CM | POA: Diagnosis not present

## 2011-10-30 LAB — BASIC METABOLIC PANEL
Calcium: 8.9 mg/dL (ref 8.4–10.5)
Creatinine, Ser: 1.9 mg/dL — ABNORMAL HIGH (ref 0.50–1.35)
GFR calc Af Amer: 39 mL/min — ABNORMAL LOW (ref >90)

## 2011-10-30 NOTE — H&P (Signed)
[name] is an 71 y.o. male.   Chief Complaint: c/o progressive numbness and tingling of the left hand. HPI: [name] has been having significant numbness in his hands, primarily on the left. This has impaired his sleep. He has been wearing a wrist forearm brace at night.   Past Medical History  Diagnosis Date  . Obstructive sleep apnea   . Gout   . Hypertension   . Benign prostatic hypertrophy   . Rosacea, acne   . Elevated PSA   . Nephrolithiasis     hx of  . Erectile dysfunction   . Partial Achilles tendon tear   . Diabetes mellitus type II     no meds    Past Surgical History  Procedure Date  . Carpal tunnel release 2004  . Inguinal hernia repair   . Transurethral resection of prostate   . Tonsillectomy and adenoidectomy   . Hand surgery 2007    right hand  . Knee surgery 1961    left knee  . Joint replacement     left total knee  . Tonsillectomy   . Eye surgery     cataracts    Family History  Problem Relation Age of Onset  . Thalassemia Son    Social History:  reports that he has never smoked. He has never used smokeless tobacco. He reports that he drinks alcohol. He reports that he does not use illicit drugs.  Allergies:  Allergies  Allergen Reactions  . Ciprofloxacin     REACTION: Diarrhea,l rectal irritation    No current facility-administered medications on file as of .   Medications Prior to Admission  Medication Sig Dispense Refill  . allopurinol (ZYLOPRIM) 100 MG tablet Take 1 tablet (100 mg total) by mouth daily.  30 tablet  2  . diltiazem (CARDIZEM CD) 240 MG 24 hr capsule Take 1 capsule (240 mg total) by mouth daily.  30 capsule  5  . finasteride (PROSCAR) 5 MG tablet Take 5 mg by mouth daily.        . fluticasone (FLONASE) 50 MCG/ACT nasal spray 2 sprays by Nasal route daily.        Marland Kitchen HYDROcodone-acetaminophen (VICODIN) 5-500 MG per tablet Take 1 tablet by mouth every 8 (eight) hours as needed for pain.  20 tablet  0  . polyethylene  glycol (MIRALAX) powder Take by mouth daily. Once daily as directed       . sulfamethoxazole-trimethoprim (BACTRIM DS,SEPTRA DS) 800-160 MG per tablet Take 1 tablet by mouth 2 (two) times daily.         Results for orders placed during the hospital encounter of 10/31/11 (from the past 48 hour(s))  BASIC METABOLIC PANEL     Status: Abnormal   Collection Time   10/30/11  9:17 AM      Component Value Range Comment   Sodium 138  135 - 145 (mEq/L)    Potassium 4.1  3.5 - 5.1 (mEq/L)    Chloride 105  96 - 112 (mEq/L)    CO2 24  19 - 32 (mEq/L)    Glucose, Bld 198 (*) 70 - 99 (mg/dL)    BUN 24 (*) 6 - 23 (mg/dL)    Creatinine, Ser 1.61 (*) 0.50 - 1.35 (mg/dL)    Calcium 8.9  8.4 - 10.5 (mg/dL)    GFR calc non Af Amer 34 (*) >90 (mL/min)    GFR calc Af Amer 39 (*) >90 (mL/min)     No results found.  Pertinent items are noted in HPI.  There were no vitals taken for this visit.  General appearance: alert Head: Normocephalic, without obvious abnormality Neck: supple, symmetrical, trachea midline Resp: clear to auscultation bilaterally Cardio: regular rate and rhythm, S1, S2 normal, no murmur, click, rub or gallop GI: normal findings: bowel sounds normal Extremities:Inspection of his hands reveals the stigmata of osteoarthritis including Heberden's and Bouchard's nodes. He has shouldering of his left thumb CMC joint. Careful inspection of [name] hands reveals that he has lost the dermatoglyphics of his index, long and ring fingers. This is consistent with chronic carpal tunnel syndrome. His small finger dermatoglyphics and thumb dermatoglyphics are preserved on the left, present on the right. . He has healed incisions over the index finger A-1 pulley region bilaterally. He has no sign of STS at this time of his fingers, thumb or at the first dorsal compartments.   [name] has pain with CMC translation and grind on the left.  Three views of his left hand are obtained AP, lateral including the  carpus and a tangential [employer]. He has Eaton stage III CMC arthrosis and narrowing of all IP joints.   Dr. Johna Roles completed electrodiagnostic studies. These revealed moderately severe carpal tunnel syndrome on the left with a sensory amplitude of 10 microvolts. He is slow on the right but has a sensory amplitude of 25.3 microvolts.  Pulses: 2+ and symmetric Skin: normal Neurologic: Grossly normal    Assessment/Plan Impression: Left CTS  Plan: Left CTR. The procedure, risks and post-op course were discussed with the patient at length and he was in agreement.   DASNOIT,[name] 10/30/2011, 4:22 PM   H&P documentation: 10/31/2011  -History and Physical Reviewed Increased glucose and elevated creatinine noted  -Patient has been re-examined counseling to contact primary MD emphasized.  -No change in the plan of care  Wyn Forster, MD

## 2011-10-31 ENCOUNTER — Ambulatory Visit (HOSPITAL_BASED_OUTPATIENT_CLINIC_OR_DEPARTMENT_OTHER)
Admission: RE | Admit: 2011-10-31 | Discharge: 2011-10-31 | Disposition: A | Discharge status: Home / Self Care | Payer: Medicare Other | Source: Ambulatory Visit | Attending: Orthopedic Surgery | Admitting: Orthopedic Surgery

## 2011-10-31 ENCOUNTER — Encounter (HOSPITAL_BASED_OUTPATIENT_CLINIC_OR_DEPARTMENT_OTHER)
Admission: RE | Disposition: A | Discharge status: Home / Self Care | Payer: Self-pay | Source: Ambulatory Visit | Attending: Orthopedic Surgery

## 2011-10-31 ENCOUNTER — Encounter (HOSPITAL_BASED_OUTPATIENT_CLINIC_OR_DEPARTMENT_OTHER): Payer: Self-pay | Admitting: Anesthesiology

## 2011-10-31 ENCOUNTER — Encounter (HOSPITAL_BASED_OUTPATIENT_CLINIC_OR_DEPARTMENT_OTHER): Payer: Self-pay | Admitting: Certified Registered"

## 2011-10-31 ENCOUNTER — Telehealth: Payer: Self-pay | Admitting: *Deleted

## 2011-10-31 ENCOUNTER — Ambulatory Visit (HOSPITAL_BASED_OUTPATIENT_CLINIC_OR_DEPARTMENT_OTHER): Payer: Medicare Other | Admitting: Anesthesiology

## 2011-10-31 ENCOUNTER — Encounter (HOSPITAL_BASED_OUTPATIENT_CLINIC_OR_DEPARTMENT_OTHER): Payer: Self-pay | Admitting: *Deleted

## 2011-10-31 DIAGNOSIS — N400 Enlarged prostate without lower urinary tract symptoms: Secondary | ICD-10-CM | POA: Insufficient documentation

## 2011-10-31 DIAGNOSIS — I10 Essential (primary) hypertension: Secondary | ICD-10-CM | POA: Diagnosis not present

## 2011-10-31 DIAGNOSIS — M109 Gout, unspecified: Secondary | ICD-10-CM | POA: Insufficient documentation

## 2011-10-31 DIAGNOSIS — E119 Type 2 diabetes mellitus without complications: Secondary | ICD-10-CM | POA: Diagnosis not present

## 2011-10-31 DIAGNOSIS — Z01810 Encounter for preprocedural cardiovascular examination: Secondary | ICD-10-CM | POA: Insufficient documentation

## 2011-10-31 DIAGNOSIS — G5600 Carpal tunnel syndrome, unspecified upper limb: Secondary | ICD-10-CM | POA: Insufficient documentation

## 2011-10-31 DIAGNOSIS — Z87442 Personal history of urinary calculi: Secondary | ICD-10-CM | POA: Insufficient documentation

## 2011-10-31 DIAGNOSIS — N289 Disorder of kidney and ureter, unspecified: Secondary | ICD-10-CM | POA: Insufficient documentation

## 2011-10-31 DIAGNOSIS — G4733 Obstructive sleep apnea (adult) (pediatric): Secondary | ICD-10-CM | POA: Insufficient documentation

## 2011-10-31 HISTORY — PX: CARPAL TUNNEL RELEASE: SHX101

## 2011-10-31 LAB — GLUCOSE, CAPILLARY: Glucose-Capillary: 110 mg/dL — ABNORMAL HIGH (ref 70–99)

## 2011-10-31 SURGERY — CARPAL TUNNEL RELEASE
Anesthesia: General | Site: Wrist | Laterality: Left | Wound class: Clean

## 2011-10-31 MED ORDER — HYDROMORPHONE HCL PF 1 MG/ML IJ SOLN: 0.2500 mg | INTRAMUSCULAR | Status: DC | PRN

## 2011-10-31 MED ORDER — LIDOCAINE HCL 2 % IJ SOLN
INTRAMUSCULAR | Status: DC | PRN
  Administered 2011-10-31: 4 mL

## 2011-10-31 MED ORDER — FENTANYL CITRATE 0.05 MG/ML IJ SOLN
INTRAMUSCULAR | Status: DC | PRN
  Administered 2011-10-31: 100 ug via INTRAVENOUS
  Administered 2011-10-31: 25 ug via INTRAVENOUS
  Administered 2011-10-31: 50 ug via INTRAVENOUS

## 2011-10-31 MED ORDER — HYDROCODONE-ACETAMINOPHEN 5-325 MG PO TABS: ORAL_TABLET | ORAL | Status: DC

## 2011-10-31 MED ORDER — EPHEDRINE SULFATE 50 MG/ML IJ SOLN
INTRAMUSCULAR | Status: DC | PRN
Start: 2011-10-31 — End: 2011-10-31
  Administered 2011-10-31: 10 mg via INTRAVENOUS

## 2011-10-31 MED ORDER — ONDANSETRON HCL 4 MG/2ML IJ SOLN: 4.0000 mg | Freq: Four times a day (QID) | INTRAMUSCULAR | Status: DC | PRN

## 2011-10-31 MED ORDER — MIDAZOLAM HCL 5 MG/5ML IJ SOLN
INTRAMUSCULAR | Status: DC | PRN
  Administered 2011-10-31: 1 mg via INTRAVENOUS

## 2011-10-31 MED ORDER — ONDANSETRON HCL 4 MG/2ML IJ SOLN
INTRAMUSCULAR | Status: DC | PRN
  Administered 2011-10-31: 4 mg via INTRAVENOUS

## 2011-10-31 MED ORDER — LIDOCAINE HCL (CARDIAC) 20 MG/ML IV SOLN
INTRAVENOUS | Status: DC | PRN
  Administered 2011-10-31: 80 mg via INTRAVENOUS

## 2011-10-31 MED ORDER — PROPOFOL 10 MG/ML IV EMUL
INTRAVENOUS | Status: DC | PRN
  Administered 2011-10-31: 230 mg via INTRAVENOUS

## 2011-10-31 MED ORDER — CHLORHEXIDINE GLUCONATE 4 % EX LIQD: 60.0000 mL | Freq: Once | CUTANEOUS | Status: DC

## 2011-10-31 MED ORDER — LACTATED RINGERS IV SOLN
INTRAVENOUS | Status: DC
  Administered 2011-10-31 (×2): via INTRAVENOUS

## 2011-10-31 SURGICAL SUPPLY — 39 items
BANDAGE ADHESIVE 1X3 (GAUZE/BANDAGES/DRESSINGS) IMPLANT
BANDAGE ELASTIC 3 VELCRO ST LF (GAUZE/BANDAGES/DRESSINGS) ×2 IMPLANT
BLADE SURG 15 STRL LF DISP TIS (BLADE) ×1 IMPLANT
BLADE SURG 15 STRL SS (BLADE) ×2
BNDG CMPR 9X4 STRL LF SNTH (GAUZE/BANDAGES/DRESSINGS) ×1
BNDG ESMARK 4X9 LF (GAUZE/BANDAGES/DRESSINGS) ×1 IMPLANT
BRUSH SCRUB EZ PLAIN DRY (MISCELLANEOUS) ×2 IMPLANT
CLOTH BEACON ORANGE TIMEOUT ST (SAFETY) ×2 IMPLANT
CORDS BIPOLAR (ELECTRODE) ×1 IMPLANT
COVER MAYO STAND STRL (DRAPES) ×2 IMPLANT
COVER TABLE BACK 60X90 (DRAPES) ×2 IMPLANT
CUFF TOURNIQUET SINGLE 18IN (TOURNIQUET CUFF) ×1 IMPLANT
DECANTER SPIKE VIAL GLASS SM (MISCELLANEOUS) ×1 IMPLANT
DRAPE EXTREMITY T 121X128X90 (DRAPE) ×2 IMPLANT
DRAPE SURG 17X23 STRL (DRAPES) ×2 IMPLANT
GLOVE BIO SURGEON STRL SZ 6.5 (GLOVE) ×1 IMPLANT
GLOVE BIOGEL M STRL SZ7.5 (GLOVE) ×2 IMPLANT
GLOVE EXAM NITRILE MD LF STRL (GLOVE) ×1 IMPLANT
GLOVE ORTHO TXT STRL SZ7.5 (GLOVE) ×2 IMPLANT
GOWN BRE IMP PREV XXLGXLNG (GOWN DISPOSABLE) ×2 IMPLANT
GOWN PREVENTION PLUS XLARGE (GOWN DISPOSABLE) ×2 IMPLANT
NEEDLE 27GAX1X1/2 (NEEDLE) ×1 IMPLANT
NS IRRIG 1000ML POUR BTL (IV SOLUTION) ×1 IMPLANT
PACK BASIN DAY SURGERY FS (CUSTOM PROCEDURE TRAY) ×2 IMPLANT
PAD CAST 3X4 CTTN HI CHSV (CAST SUPPLIES) ×1 IMPLANT
PADDING CAST ABS 4INX4YD NS (CAST SUPPLIES)
PADDING CAST ABS COTTON 4X4 ST (CAST SUPPLIES) ×1 IMPLANT
PADDING CAST COTTON 3X4 STRL (CAST SUPPLIES) ×2
SPLINT PLASTER CAST XFAST 3X15 (CAST SUPPLIES) ×5 IMPLANT
SPLINT PLASTER XTRA FASTSET 3X (CAST SUPPLIES) ×5
SPONGE GAUZE 4X4 12PLY (GAUZE/BANDAGES/DRESSINGS) ×1 IMPLANT
STOCKINETTE 4X48 STRL (DRAPES) ×2 IMPLANT
STRIP CLOSURE SKIN 1/2X4 (GAUZE/BANDAGES/DRESSINGS) ×2 IMPLANT
SUT PROLENE 3 0 PS 2 (SUTURE) ×2 IMPLANT
SYR 3ML 23GX1 SAFETY (SYRINGE) IMPLANT
SYR CONTROL 10ML LL (SYRINGE) ×1 IMPLANT
TRAY DSU PREP LF (CUSTOM PROCEDURE TRAY) ×2 IMPLANT
UNDERPAD 30X30 INCONTINENT (UNDERPADS AND DIAPERS) ×2 IMPLANT
WATER STERILE IRR 1000ML POUR (IV SOLUTION) ×1 IMPLANT

## 2011-10-31 NOTE — Anesthesia Postprocedure Evaluation (Signed)
Anesthesia Post Note  Patient: [name]  Procedure(s) Performed:  CARPAL TUNNEL RELEASE  Anesthesia type: General  Patient location: PACU  Post pain: Pain level controlled and Adequate analgesia  Post assessment: Post-op Vital signs reviewed, Patient's Cardiovascular Status Stable, Respiratory Function Stable, Patent Airway and Pain level controlled  Last Vitals:  Filed Vitals:   10/31/11 1030  BP: 132/61  Pulse: 68  Temp:   Resp: 11    Post vital signs: Reviewed and stable  Level of consciousness: awake, alert  and oriented  Complications: No apparent anesthesia complications

## 2011-10-31 NOTE — Telephone Encounter (Signed)
Ok to schedule appt

## 2011-10-31 NOTE — Anesthesia Preprocedure Evaluation (Addendum)
Anesthesia Evaluation  Patient identified by MRN, date of birth, ID band Patient awake    Reviewed: Allergy & Precautions, H&P , NPO status , Patient's Chart, lab work & pertinent test results  Airway Mallampati: II  Neck ROM: full    Dental   Pulmonary sleep apnea ,          Cardiovascular hypertension,     Neuro/Psych  Neuromuscular disease    GI/Hepatic   Endo/Other  Diabetes mellitus-  Renal/GU Renal InsufficiencyRenal disease     Musculoskeletal   Abdominal   Peds  Hematology   Anesthesia Other Findings   Reproductive/Obstetrics                          Anesthesia Physical Anesthesia Plan  ASA: II  Anesthesia Plan: General   Post-op Pain Management:    Induction: Intravenous  Airway Management Planned: LMA  Additional Equipment:   Intra-op Plan:   Post-operative Plan:   Informed Consent: I have reviewed the patients History and Physical, chart, labs and discussed the procedure including the risks, benefits and alternatives for the proposed anesthesia with the patient or authorized representative who has indicated his/her understanding and acceptance.     Plan Discussed with: CRNA and Surgeon  Anesthesia Plan Comments:         Anesthesia Quick Evaluation

## 2011-10-31 NOTE — Anesthesia Procedure Notes (Signed)
Procedure Name: LMA Insertion Date/Time: 10/31/2011 9:31 AM Performed by: Radford Pax Pre-anesthesia Checklist: Patient identified, Emergency Drugs available, Suction available, Patient being monitored and Timeout performed Patient Re-evaluated:Patient Re-evaluated prior to inductionOxygen Delivery Method: Circle System Utilized Preoxygenation: Pre-oxygenation with 100% oxygen Intubation Type: IV induction Ventilation: Mask ventilation without difficulty LMA: LMA with gastric port inserted LMA Size: 5.0 Number of attempts: 1 (atraumatic) Placement Confirmation: positive ETCO2 Tube secured with: Tape Dental Injury: Teeth and Oropharynx as per pre-operative assessment

## 2011-10-31 NOTE — Op Note (Signed)
OP NOTE: [id number]

## 2011-10-31 NOTE — Transfer of Care (Signed)
Immediate Anesthesia Transfer of Care Note  Patient: [name]  Procedure(s) Performed:  CARPAL TUNNEL RELEASE  Patient Location: PACU  Anesthesia Type: General  Level of Consciousness: awake, alert , oriented and patient cooperative  Airway & Oxygen Therapy: Patient Spontanous Breathing and Patient connected to face mask oxygen  Post-op Assessment: Report given to PACU RN and Post -op Vital signs reviewed and stable  Post vital signs: Reviewed and stable  Complications: No apparent anesthesia complications

## 2011-10-31 NOTE — Brief Op Note (Signed)
10/31/2011  9:54 AM  PATIENT:  [name]  71 y.o. male  PRE-OPERATIVE DIAGNOSIS:  left carpal tunnel syndrome, type 2 diabetes, gout  POST-OPERATIVE DIAGNOSIS:  left carpal tunnel syndrome, type 2 diabetes, gout  PROCEDURE:  Procedure(s): CARPAL TUNNEL RELEASE LEFT   SURGEON:  Surgeon(s): Wyn Forster., MD  PHYSICIAN ASSISTANT:   ASSISTANTS:Ayodeji Keimig Dasnoit,P.A-C     ANESTHESIA:   general  EBL:  Total I/O In: 400 [I.V.:400] Out: -   BLOOD ADMINISTERED:none  DRAINS: none   LOCAL MEDICATIONS USED:  XYLOCAINE 4 CC 2%  SPECIMEN:  No Specimen  DISPOSITION OF SPECIMEN:  N/A  COUNTS:  YES  TOURNIQUET:   Total Tourniquet Time Documented: Upper Arm (Left) - 11 minutes  DICTATION: .Other Dictation: Dictation Number [id number]  PLAN OF CARE: Discharge to home after PACU  PATIENT DISPOSITION:  PACU - hemodynamically stable.   Delay start of Pharmacological VTE agent (>24hrs) due to surgical blood loss or risk of bleeding:  NO/NOT APPLICABLE

## 2011-10-31 NOTE — Telephone Encounter (Signed)
Pt is in the hospital having carpal tunnel by Dr. Teressa Senter today, and his BSs and Renal Functions are all abnormal.  Dr. Teressa Senter is asking if Dr. Cato Mulligan will look at his labs, and have him come in soon for a discussion of these.

## 2011-10-31 NOTE — Op Note (Signed)
NAME:  [name], [name]                   ACCOUNT NO.:  MEDICAL RECORD NO.:  [id number]  LOCATION:                                 FACILITY:  PHYSICIAN:  Ryan Fitch. Hajer Dwyer, M.D.      DATE OF BIRTH:  DATE OF PROCEDURE:  10/31/2011 DATE OF DISCHARGE:                              OPERATIVE REPORT   PREOPERATIVE DIAGNOSIS:  Chronic left carpal tunnel syndrome associated with type 2 diabetes and gout.  POSTOPERATIVE DIAGNOSIS:  Chronic left carpal tunnel syndrome associated with type 2 diabetes and gout.  OPERATION:  Release of left transverse carpal ligament.  OPERATING SURGEON:  Ryan Fitch. Carolena Fairbank, MD  ASSISTANT:  Annye Rusk, PA-C  ANESTHESIA:  General by LMA.  SUPERVISING ANESTHESIOLOGIST:  Achille Rich, MD  INDICATIONS:  [name] is a 71 year old retired gentleman who has a history of type 2 diabetes, hypertension, obstructive sleep apnea, gout, mild renal insufficiency, and history of renal calculi.  He was referred through the courtesy of Dr. Birdie Sons for evaluation and management of hand numbness.  Clinical examination revealed signs of carpal tunnel syndrome.  He initially responded to nonoperative measures but then developed persistent hand numbness and discomfort.  We recommended proceeding with release of left transverse carpal ligament.  Preoperative electrodiagnostic studies documenting significant left median neuropathy.  Upon review of [name]'s preoperative lab, we noted that his random blood glucose is 198.  A fasting glucose this morning was 110.  His creatinine was noted to be somewhat elevated.  We had a detailed informed consent with [name] preoperatively in which we reminded him to return to see Dr. Cato Mulligan and/or Dr. Amador Cunas for further discussion of his  multiple medical problems.  We recommended that he closely monitor his uric acid, speak to Dr. Earlene Plater, his urologist regarding his renal calculi and renal function, and  speak with Dr. Cato Mulligan and Dr. Amador Cunas regarding his blood glucose in whether or not he might should be on a medication for this.  Our plan is to proceed with release of left transverse carpal ligament at this time.  The anticipated outcome for surgery is improved sensation, decrease in pain.  Potential risks, and benefits were discussed in detail.  PROCEDURE:  Tanush Drees was brought to room 1 of the [facility] and placed in supine position upon the operating table.  Following the induction of general anesthesia by LMA technique under Dr. Seward Meth supervision, the left arm was prepped with Betadine soap and solution and sterilely draped.  A pneumatic tourniquet was applied to the proximal left brachium.  Following exsanguination of the left arm with an Esmarch bandage, the arterial tourniquet was inflated to 250 mmHg due to mild systolic hypertension.  Procedure commenced following routine surgical time-out with a short incision in the line of the ring finger and the palm.  Subcutaneous tissues were carefully divided revealing the palmar fascia.  This was split longitudinally to reveal the common sensory branch of the median nerve.  These were followed back to the median nerve proper which was gently isolated from the transverse carpal ligament with a Insurance risk surveyor.  The transverse carpal ligament was then carefully  released along its ulnar border with scissors extending into the distal forearm. The volar forearm fascia was released subcutaneously.  This widely opened the carpal canal.  The ulnar bursa was noted to be quite fibrotic and opaque.  This is typical for individuals with gout and diabetes.  Bleeding points were electrocauterized along the margin of the released ligament with bipolar forceps.  The wound was then repaired with intradermal 3-0 Prolene suture.  A 4 mL of 2% lidocaine were infiltrated for postoperative analgesia. There were no  apparent complications.  [name] was placed in compressive dressing with a volar plaster splint maintaining his wrist in 10 degrees of dorsiflexion.     [name], M.D.     RVS/MEDQ  D:  10/31/2011  T:  10/31/2011  Job:  [id number]  cc:   Valetta Mole. Swords, MD

## 2011-11-01 ENCOUNTER — Encounter (HOSPITAL_BASED_OUTPATIENT_CLINIC_OR_DEPARTMENT_OTHER): Payer: Self-pay | Admitting: Orthopedic Surgery

## 2011-11-01 ENCOUNTER — Encounter: Payer: Self-pay | Admitting: Internal Medicine

## 2011-11-01 NOTE — Telephone Encounter (Signed)
appt scheduled

## 2011-11-06 ENCOUNTER — Ambulatory Visit (INDEPENDENT_AMBULATORY_CARE_PROVIDER_SITE_OTHER): Payer: Medicare Other | Admitting: Internal Medicine

## 2011-11-06 ENCOUNTER — Encounter: Payer: Self-pay | Admitting: Internal Medicine

## 2011-11-06 DIAGNOSIS — N289 Disorder of kidney and ureter, unspecified: Secondary | ICD-10-CM | POA: Diagnosis not present

## 2011-11-06 DIAGNOSIS — E119 Type 2 diabetes mellitus without complications: Secondary | ICD-10-CM

## 2011-11-06 LAB — BASIC METABOLIC PANEL
BUN: 28 mg/dL — ABNORMAL HIGH (ref 6–23)
Creatinine, Ser: 2 mg/dL — ABNORMAL HIGH (ref 0.4–1.5)
GFR: 36.03 mL/min — ABNORMAL LOW (ref >60.00)

## 2011-11-06 LAB — HEMOGLOBIN A1C: Hgb A1c MFr Bld: 6.6 % — ABNORMAL HIGH (ref 4.6–6.5)

## 2011-11-06 NOTE — Progress Notes (Signed)
Patient ID: [name], male   DOB: 10-24-1940, 71 y.o.   MRN: [id number]  patient comes in for followup of multiple medical problems including type 2 diabetes, hyperlipidemia, hypertension. The patient does not check blood sugar or blood pressure at home. The patetient does not follow an exercise or diet program. The patient denies any polyuria, polydipsia.  In the past the patient has gone to diabetic treatment center. The patient is tolerating medications  Without difficulty. The patient does admit to medication compliance.   Some concern with preop labs---creatinine:  Lab Results  Component Value Date   CREATININE 1.90* 10/30/2011    Past Medical History  Diagnosis Date  . Obstructive sleep apnea   . Gout   . Hypertension   . Benign prostatic hypertrophy   . Rosacea, acne   . Elevated PSA   . Nephrolithiasis     hx of  . Erectile dysfunction   . Partial Achilles tendon tear   . Diabetes mellitus type II     no meds    History   Social History  . Marital Status: Married    Spouse Name: N/A    Number of Children: N/A  . Years of Education: N/A   Occupational History  . Not on file.   Social History Main Topics  . Smoking status: Never Smoker   . Smokeless tobacco: Never Used  . Alcohol Use: Yes     rarely  . Drug Use: No  . Sexually Active: Not on file   Other Topics Concern  . Not on file   Social History Narrative   Regular exercise. Avoid Ibuprofen, naproxen, NSAIDS.Designated Party Release signed on 03/03/2010.    Past Surgical History  Procedure Date  . Carpal tunnel release 2004  . Inguinal hernia repair   . Transurethral resection of prostate   . Tonsillectomy and adenoidectomy   . Hand surgery 2007    right hand  . Knee surgery 1961    left knee  . Joint replacement     left total knee  . Tonsillectomy   . Eye surgery     cataracts  . Carpal tunnel release 10/31/2011    Procedure: CARPAL TUNNEL RELEASE;  Surgeon: Wyn Forster., MD;   Location: [facility];  Service: Orthopedics;  Laterality: Left;    Family History  Problem Relation Age of Onset  . Thalassemia Son     Allergies  Allergen Reactions  . Ciprofloxacin     REACTION: Diarrhea,l rectal irritation    Current Outpatient Prescriptions on File Prior to Visit  Medication Sig Dispense Refill  . allopurinol (ZYLOPRIM) 100 MG tablet Take 1 tablet (100 mg total) by mouth daily.  30 tablet  2  . diltiazem (CARDIZEM CD) 240 MG 24 hr capsule Take 1 capsule (240 mg total) by mouth daily.  30 capsule  5  . finasteride (PROSCAR) 5 MG tablet Take 5 mg by mouth daily.        . polyethylene glycol (MIRALAX) powder Take by mouth daily. Once daily as directed       . sulfamethoxazole-trimethoprim (BACTRIM DS,SEPTRA DS) 800-160 MG per tablet Take 1 tablet by mouth 2 (two) times daily.       . fluticasone (FLONASE) 50 MCG/ACT nasal spray 2 sprays by Nasal route daily.           patient denies chest pain, shortness of breath, orthopnea. Denies lower extremity edema, abdominal pain, change in appetite, change in bowel  movements. Patient denies rashes, musculoskeletal complaints. No other specific complaints in a complete review of systems.   BP 152/80  Temp(Src) 98 F (36.7 C) (Oral)  Wt 281 lb (127.461 kg)  Well-developed well-nourished male in no acute distress. HEENT exam atraumatic, normocephalic, extraocular muscles are intact. Neck is supple. No jugular venous distention no thyromegaly. Chest clear to auscultation without increased work of breathing. Cardiac exam S1 and S2 are regular. Abdominal exam active bowel sounds, soft, nontender. Extremities no edema.  Has left hand wrapped

## 2011-11-06 NOTE — Assessment & Plan Note (Signed)
Check labs today.

## 2011-11-06 NOTE — Assessment & Plan Note (Signed)
Note recent capillary glucose Check labs today

## 2011-11-09 ENCOUNTER — Other Ambulatory Visit: Payer: Self-pay | Admitting: Internal Medicine

## 2011-11-09 ENCOUNTER — Other Ambulatory Visit (INDEPENDENT_AMBULATORY_CARE_PROVIDER_SITE_OTHER): Payer: Medicare Other

## 2011-11-09 DIAGNOSIS — N289 Disorder of kidney and ureter, unspecified: Secondary | ICD-10-CM

## 2011-11-09 DIAGNOSIS — N390 Urinary tract infection, site not specified: Secondary | ICD-10-CM | POA: Diagnosis not present

## 2011-11-09 LAB — POCT URINALYSIS DIPSTICK
Bilirubin, UA: NEGATIVE
Nitrite, UA: NEGATIVE
Urobilinogen, UA: 0.2
pH, UA: 6

## 2011-11-13 ENCOUNTER — Ambulatory Visit
Admission: RE | Admit: 2011-11-13 | Discharge: 2011-11-13 | Disposition: A | Discharge status: Home / Self Care | Payer: Medicare Other | Source: Ambulatory Visit | Attending: Internal Medicine | Admitting: Internal Medicine

## 2011-11-13 DIAGNOSIS — N289 Disorder of kidney and ureter, unspecified: Secondary | ICD-10-CM

## 2011-11-13 DIAGNOSIS — N281 Cyst of kidney, acquired: Secondary | ICD-10-CM | POA: Diagnosis not present

## 2011-11-15 ENCOUNTER — Telehealth: Payer: Self-pay | Admitting: Internal Medicine

## 2011-11-15 ENCOUNTER — Telehealth: Payer: Self-pay

## 2011-11-15 DIAGNOSIS — R7309 Other abnormal glucose: Secondary | ICD-10-CM | POA: Diagnosis not present

## 2011-11-15 DIAGNOSIS — I10 Essential (primary) hypertension: Secondary | ICD-10-CM | POA: Diagnosis not present

## 2011-11-15 DIAGNOSIS — N400 Enlarged prostate without lower urinary tract symptoms: Secondary | ICD-10-CM | POA: Diagnosis not present

## 2011-11-15 NOTE — Telephone Encounter (Signed)
Pt need ultrasound/bloodwork results

## 2011-11-15 NOTE — Telephone Encounter (Signed)
Spoke with pts wife and she is aware. State they will call back to schedule an appt.

## 2011-11-15 NOTE — Telephone Encounter (Signed)
Pt aware of normal results.

## 2011-11-15 NOTE — Telephone Encounter (Signed)
Message copied by Michele Mcalpine on Wed Nov 15, 2011  1:41 PM ------      Message from: Hilarie Fredrickson      Created: Fri Oct 27, 2011 10:35 AM       Bonita Quin, I saw [name] yesterday and he was inquiring as to when he was due for followup colonoscopy. Please call him and let him know that his next exam will be due March 2013 (this year). He could schedule any time between now and then if he wishes. Thank you.

## 2011-11-20 DIAGNOSIS — N200 Calculus of kidney: Secondary | ICD-10-CM | POA: Insufficient documentation

## 2011-11-27 DIAGNOSIS — I129 Hypertensive chronic kidney disease with stage 1 through stage 4 chronic kidney disease, or unspecified chronic kidney disease: Secondary | ICD-10-CM | POA: Diagnosis not present

## 2011-11-27 DIAGNOSIS — N2581 Secondary hyperparathyroidism of renal origin: Secondary | ICD-10-CM | POA: Diagnosis not present

## 2011-11-27 DIAGNOSIS — N200 Calculus of kidney: Secondary | ICD-10-CM | POA: Diagnosis not present

## 2011-11-29 DIAGNOSIS — N2581 Secondary hyperparathyroidism of renal origin: Secondary | ICD-10-CM | POA: Diagnosis not present

## 2011-11-29 DIAGNOSIS — I129 Hypertensive chronic kidney disease with stage 1 through stage 4 chronic kidney disease, or unspecified chronic kidney disease: Secondary | ICD-10-CM | POA: Diagnosis not present

## 2012-01-24 ENCOUNTER — Other Ambulatory Visit: Payer: Self-pay | Admitting: Internal Medicine

## 2012-01-24 MED ORDER — DILTIAZEM HCL ER COATED BEADS 240 MG PO CP24: 240.0000 mg | ORAL_CAPSULE | Freq: Every day | ORAL | Status: DC

## 2012-01-24 NOTE — Telephone Encounter (Signed)
14 day rx sent in to [facility] and 90 days sent in to [facility]

## 2012-01-24 NOTE — Telephone Encounter (Signed)
Pt needs 2 wk supply diltiazem 240mg  call into [facility]. Pt also needs diltiazem 240mg  #90 with 3 refills call into [facility] [phone]

## 2012-01-25 ENCOUNTER — Encounter: Payer: Self-pay | Admitting: Internal Medicine

## 2012-02-05 ENCOUNTER — Ambulatory Visit (AMBULATORY_SURGERY_CENTER): Payer: Medicare Other | Admitting: *Deleted

## 2012-02-05 VITALS — Ht 75.0 in | Wt 270.3 lb

## 2012-02-05 DIAGNOSIS — Z1211 Encounter for screening for malignant neoplasm of colon: Secondary | ICD-10-CM

## 2012-02-05 MED ORDER — PEG-KCL-NACL-NASULF-NA ASC-C 100 G PO SOLR: ORAL | Status: DC

## 2012-02-19 ENCOUNTER — Ambulatory Visit (AMBULATORY_SURGERY_CENTER): Payer: Medicare Other | Admitting: Internal Medicine

## 2012-02-19 ENCOUNTER — Encounter: Payer: Self-pay | Admitting: Internal Medicine

## 2012-02-19 VITALS — BP 159/78 | HR 64 | Temp 96.1°F | Resp 18 | Ht 75.0 in | Wt 270.0 lb

## 2012-02-19 DIAGNOSIS — D126 Benign neoplasm of colon, unspecified: Secondary | ICD-10-CM | POA: Diagnosis not present

## 2012-02-19 DIAGNOSIS — G4733 Obstructive sleep apnea (adult) (pediatric): Secondary | ICD-10-CM | POA: Diagnosis not present

## 2012-02-19 DIAGNOSIS — E119 Type 2 diabetes mellitus without complications: Secondary | ICD-10-CM | POA: Diagnosis not present

## 2012-02-19 DIAGNOSIS — Z86010 Personal history of colonic polyps: Secondary | ICD-10-CM

## 2012-02-19 DIAGNOSIS — Z1211 Encounter for screening for malignant neoplasm of colon: Secondary | ICD-10-CM

## 2012-02-19 MED ORDER — SODIUM CHLORIDE 0.9 % IV SOLN: 500.0000 mL | INTRAVENOUS | Status: DC

## 2012-02-19 NOTE — Op Note (Signed)
[facility] [address] [place], [place]  [address]  COLONOSCOPY PROCEDURE REPORT  PATIENT:  [name], [name]  MR#:  [id number] BIRTHDATE:  01-Oct-1941, 71 yrs. old  GENDER:  male ENDOSCOPIST:  Wilhemina Bonito. Eda Keys, MD REF. BY:  Surveillance Program Recall, PROCEDURE DATE:  02/19/2012 PROCEDURE:  Colonoscopy with snare polypectomy x 1 ASA CLASS:  Class II INDICATIONS:  history of pre-cancerous (adenomatous) colon polyps, surveillance and high-risk screening ; index 2001 (multiple, large TAs); f/u 2003, 2006, 2010 MEDICATIONS:   MAC sedation, administered by CRNA, propofol (Diprivan) 300 mg IV  DESCRIPTION OF PROCEDURE:   After the risks benefits and alternatives of the procedure were thoroughly explained, informed consent was obtained.  Digital rectal exam was performed and revealed no abnormalities.   The LB CF-[id number] endoscope was introduced through the anus and advanced to the cecum, which was identified by both the appendix and ileocecal valve, without limitations.  The quality of the prep was adequate, using MoviPrep.  The instrument was then slowly withdrawn as the colon was fully examined. <<PROCEDUREIMAGES>>  FINDINGS:  A diminutive polyp was found in the cecum and snared without cautery. Retrieval was successful.   Moderate diverticulosis was found in the left colon.  Otherwise normal colonoscopy without other polyps, masses, vascular ectasias, or inflammatory changes.   Retroflexed views in the rectum revealed internal hemorrhoids.    The time to cecum =  2:02  minutes. The scope was then withdrawn in  19:14  minutes from the cecum and the procedure completed.  COMPLICATIONS:  None  ENDOSCOPIC IMPRESSION: 1) Diminutive polyp in the cecum - removed 2) Moderate diverticulosis in the left colon 3) Otherwise normal colonoscopy  RECOMMENDATIONS: 1) Follow up colonoscopy in 5 years  ______________________________ Wilhemina Bonito. Eda Keys, MD  CC:  Lindley Magnus, MD;  The Patient  n. eSIGNED:   Wilhemina Bonito. Eda Keys at 02/19/2012 09:23 AM  [place], Dorma Russell, [phone]

## 2012-02-19 NOTE — Patient Instructions (Signed)

## 2012-02-19 NOTE — Progress Notes (Signed)
Patient did not experience any of the following events: a burn prior to discharge; a fall within the facility; wrong site/side/patient/procedure/implant event; or a hospital transfer or hospital admission upon discharge from the facility. (G8907) Patient did not have preoperative order for IV antibiotic SSI prophylaxis. (G8918)  

## 2012-02-20 ENCOUNTER — Telehealth: Payer: Self-pay | Admitting: *Deleted

## 2012-02-20 ENCOUNTER — Other Ambulatory Visit: Payer: Self-pay | Admitting: Dermatology

## 2012-02-20 DIAGNOSIS — I10 Essential (primary) hypertension: Secondary | ICD-10-CM | POA: Diagnosis not present

## 2012-02-20 DIAGNOSIS — N200 Calculus of kidney: Secondary | ICD-10-CM | POA: Diagnosis not present

## 2012-02-20 DIAGNOSIS — D485 Neoplasm of uncertain behavior of skin: Secondary | ICD-10-CM | POA: Diagnosis not present

## 2012-02-20 DIAGNOSIS — D239 Other benign neoplasm of skin, unspecified: Secondary | ICD-10-CM | POA: Diagnosis not present

## 2012-02-20 NOTE — Telephone Encounter (Signed)
  Follow up Call-  Call back number 02/19/2012  Post procedure Call Back phone  # [phone]  Permission to leave phone message Yes     Patient questions:  Do you have a fever, pain , or abdominal swelling? no Pain Score  0 *  Have you tolerated food without any problems? yes  Have you been able to return to your normal activities? yes  Do you have any questions about your discharge instructions: Diet   no Medications  no Follow up visit  no  Do you have questions or concerns about your Care? no  Actions: * If pain score is 4 or above: No action needed, pain <4.

## 2012-02-23 ENCOUNTER — Encounter: Payer: Self-pay | Admitting: Internal Medicine

## 2012-02-23 DIAGNOSIS — I10 Essential (primary) hypertension: Secondary | ICD-10-CM | POA: Diagnosis not present

## 2012-02-26 DIAGNOSIS — I10 Essential (primary) hypertension: Secondary | ICD-10-CM | POA: Diagnosis not present

## 2012-02-26 DIAGNOSIS — N200 Calculus of kidney: Secondary | ICD-10-CM | POA: Diagnosis not present

## 2012-03-12 ENCOUNTER — Ambulatory Visit (INDEPENDENT_AMBULATORY_CARE_PROVIDER_SITE_OTHER): Payer: Medicare Other | Admitting: Internal Medicine

## 2012-03-12 VITALS — BP 134/72 | HR 72 | Temp 98.2°F | Wt 269.0 lb

## 2012-03-12 DIAGNOSIS — M109 Gout, unspecified: Secondary | ICD-10-CM | POA: Diagnosis not present

## 2012-03-12 DIAGNOSIS — I10 Essential (primary) hypertension: Secondary | ICD-10-CM | POA: Diagnosis not present

## 2012-03-12 DIAGNOSIS — Z23 Encounter for immunization: Secondary | ICD-10-CM | POA: Diagnosis not present

## 2012-03-12 DIAGNOSIS — G4733 Obstructive sleep apnea (adult) (pediatric): Secondary | ICD-10-CM | POA: Diagnosis not present

## 2012-03-12 DIAGNOSIS — E119 Type 2 diabetes mellitus without complications: Secondary | ICD-10-CM

## 2012-03-12 DIAGNOSIS — N289 Disorder of kidney and ureter, unspecified: Secondary | ICD-10-CM

## 2012-03-12 LAB — BASIC METABOLIC PANEL
BUN: 25 mg/dL — ABNORMAL HIGH (ref 6–23)
Chloride: 107 mEq/L (ref 96–112)
Creatinine, Ser: 1.5 mg/dL (ref 0.4–1.5)

## 2012-03-12 LAB — HEPATIC FUNCTION PANEL
ALT: 20 U/L (ref 0–53)
Bilirubin, Direct: 0.2 mg/dL (ref 0.0–0.3)
Total Bilirubin: 1 mg/dL (ref 0.3–1.2)

## 2012-03-12 LAB — TSH: TSH: 1.45 u[IU]/mL (ref 0.35–5.50)

## 2012-03-12 LAB — LIPID PANEL
Total CHOL/HDL Ratio: 4
VLDL: 16.6 mg/dL (ref 0.0–40.0)

## 2012-03-14 NOTE — Assessment & Plan Note (Signed)
BP: 134/72 mmHg  Blood pressure reasonably well controlled. Continue current medications.

## 2012-03-14 NOTE — Progress Notes (Signed)
patient comes in for followup of multiple medical problems including type 2 diabetes, hyperlipidemia, hypertension. The patient does not check blood sugar or blood pressure at home. The patetient does not follow an exercise or diet program. The patient denies any polyuria, polydipsia.  In the past the patient has gone to diabetic treatment center. The patient is tolerating medications  Without difficulty. The patient does admit to medication compliance.   Past Medical History  Diagnosis Date  . Obstructive sleep apnea   . Gout   . Hypertension   . Benign prostatic hypertrophy   . Rosacea, acne   . Elevated PSA   . Nephrolithiasis     hx of  . Erectile dysfunction   . Partial Achilles tendon tear   . Diabetes mellitus type II     no meds    History   Social History  . Marital Status: Married    Spouse Name: N/A    Number of Children: N/A  . Years of Education: N/A   Occupational History  . Not on file.   Social History Main Topics  . Smoking status: Never Smoker   . Smokeless tobacco: Never Used  . Alcohol Use: Yes     rarely  . Drug Use: No  . Sexually Active: Not on file   Other Topics Concern  . Not on file   Social History Narrative   Regular exercise. Avoid Ibuprofen, naproxen, NSAIDS.Designated Party Release signed on 03/03/2010.    Past Surgical History  Procedure Date  . Carpal tunnel release 2004  . Inguinal hernia repair   . Transurethral resection of prostate   . Tonsillectomy and adenoidectomy   . Hand surgery 2007    right hand  . Knee surgery 1961    left knee  . Joint replacement     left total knee  . Tonsillectomy   . Eye surgery     cataracts  . Carpal tunnel release 10/31/2011    Procedure: CARPAL TUNNEL RELEASE;  Surgeon: Wyn Forster., MD;  Location: [facility];  Service: Orthopedics;  Laterality: Left;    Family History  Problem Relation Age of Onset  . Thalassemia Son   . Colon cancer Neg Hx   . Stomach  cancer Neg Hx     Allergies  Allergen Reactions  . Ciprofloxacin     REACTION: Diarrhea,l rectal irritation    Current Outpatient Prescriptions on File Prior to Visit  Medication Sig Dispense Refill  . allopurinol (ZYLOPRIM) 100 MG tablet Take 1 tablet (100 mg total) by mouth daily.  30 tablet  2  . diltiazem (CARDIZEM CD) 240 MG 24 hr capsule Take 1 capsule (240 mg total) by mouth daily.  90 capsule  3  . finasteride (PROSCAR) 5 MG tablet Take 5 mg by mouth daily.        . fluticasone (FLONASE) 50 MCG/ACT nasal spray 2 sprays by Nasal route daily.        Letta Pate VERIO test strip       . polyethylene glycol (MIRALAX) powder Take by mouth daily. Once daily as directed          patient denies chest pain, shortness of breath, orthopnea. Denies lower extremity edema, abdominal pain, change in appetite, change in bowel movements. Patient denies rashes, musculoskeletal complaints. No other specific complaints in a complete review of systems.   BP 134/72  Pulse 72  Temp(Src) 98.2 F (36.8 C) (Oral)  Wt 269 lb (122.018 kg)  well-developed well-nourished male in no acute distress. HEENT exam atraumatic, normocephalic, neck supple without jugular venous distention. Chest clear to auscultation cardiac exam S1-S2 are regular. Abdominal exam overweight with bowel sounds, soft and nontender. Extremities no edema. Neurologic exam is alert with a normal gait.

## 2012-03-14 NOTE — Assessment & Plan Note (Signed)
Lab Results  Component Value Date   HGBA1C 6.6* 11/06/2011   Previously well controlled. Will need periodic blood pressure, A1c and lipid check.

## 2012-03-14 NOTE — Assessment & Plan Note (Signed)
Basic Metabolic Panel:    Component Value Date/Time   NA 142 03/12/2012 0933   K 4.7 03/12/2012 0933   CL 107 03/12/2012 0933   CO2 27 03/12/2012 0933   BUN 25* 03/12/2012 0933   CREATININE 1.5 03/12/2012 0933   GLUCOSE 119* 03/12/2012 0933   GLUCOSE 151* 08/21/2006 1529   CALCIUM 9.0 03/12/2012 0933   Renal function has improved. He is seeing a nephrologist.

## 2012-03-14 NOTE — Assessment & Plan Note (Signed)
He is losing some weight. I think he has a long way to go before he can attempt to discontinue CPAP.

## 2012-03-14 NOTE — Assessment & Plan Note (Signed)
No recurrence. 

## 2012-03-25 ENCOUNTER — Other Ambulatory Visit: Payer: Self-pay | Admitting: Dermatology

## 2012-03-25 DIAGNOSIS — D485 Neoplasm of uncertain behavior of skin: Secondary | ICD-10-CM | POA: Diagnosis not present

## 2012-05-03 ENCOUNTER — Other Ambulatory Visit: Payer: Self-pay | Admitting: Internal Medicine

## 2012-05-03 MED ORDER — DILTIAZEM HCL ER COATED BEADS 240 MG PO CP24: 240.0000 mg | ORAL_CAPSULE | Freq: Every day | ORAL | Status: DC

## 2012-05-03 NOTE — Telephone Encounter (Signed)
Please call 2 wk supply of diltiazem 240mg  into [facility] [phone]. Pt is waiting on mailorder

## 2012-05-03 NOTE — Telephone Encounter (Signed)
2 wk supply sent in electronically

## 2012-05-09 DIAGNOSIS — I10 Essential (primary) hypertension: Secondary | ICD-10-CM | POA: Diagnosis not present

## 2012-05-09 DIAGNOSIS — Z79899 Other long term (current) drug therapy: Secondary | ICD-10-CM | POA: Diagnosis not present

## 2012-05-09 DIAGNOSIS — R7309 Other abnormal glucose: Secondary | ICD-10-CM | POA: Diagnosis not present

## 2012-05-14 DIAGNOSIS — H2510 Age-related nuclear cataract, unspecified eye: Secondary | ICD-10-CM | POA: Diagnosis not present

## 2012-05-20 DIAGNOSIS — N320 Bladder-neck obstruction: Secondary | ICD-10-CM | POA: Diagnosis not present

## 2012-05-20 DIAGNOSIS — N200 Calculus of kidney: Secondary | ICD-10-CM | POA: Diagnosis not present

## 2012-05-20 DIAGNOSIS — N189 Chronic kidney disease, unspecified: Secondary | ICD-10-CM | POA: Diagnosis not present

## 2012-05-20 DIAGNOSIS — N400 Enlarged prostate without lower urinary tract symptoms: Secondary | ICD-10-CM | POA: Diagnosis not present

## 2012-05-28 DIAGNOSIS — H2589 Other age-related cataract: Secondary | ICD-10-CM | POA: Diagnosis not present

## 2012-05-28 DIAGNOSIS — H2510 Age-related nuclear cataract, unspecified eye: Secondary | ICD-10-CM | POA: Diagnosis not present

## 2012-06-13 ENCOUNTER — Encounter: Payer: Self-pay | Admitting: Cardiology

## 2012-06-13 DIAGNOSIS — N200 Calculus of kidney: Secondary | ICD-10-CM | POA: Diagnosis not present

## 2012-06-13 DIAGNOSIS — I129 Hypertensive chronic kidney disease with stage 1 through stage 4 chronic kidney disease, or unspecified chronic kidney disease: Secondary | ICD-10-CM | POA: Diagnosis not present

## 2012-07-04 ENCOUNTER — Encounter: Payer: Self-pay | Admitting: Cardiology

## 2012-07-04 ENCOUNTER — Ambulatory Visit (INDEPENDENT_AMBULATORY_CARE_PROVIDER_SITE_OTHER): Payer: Medicare Other | Admitting: Cardiology

## 2012-07-04 VITALS — BP 141/71 | HR 68 | Ht 74.5 in | Wt 268.4 lb

## 2012-07-04 DIAGNOSIS — G4733 Obstructive sleep apnea (adult) (pediatric): Secondary | ICD-10-CM | POA: Diagnosis not present

## 2012-07-04 DIAGNOSIS — N289 Disorder of kidney and ureter, unspecified: Secondary | ICD-10-CM | POA: Diagnosis not present

## 2012-07-04 DIAGNOSIS — E119 Type 2 diabetes mellitus without complications: Secondary | ICD-10-CM | POA: Diagnosis not present

## 2012-07-04 DIAGNOSIS — I10 Essential (primary) hypertension: Secondary | ICD-10-CM

## 2012-07-04 MED ORDER — LISINOPRIL 10 MG PO TABS: 10.0000 mg | ORAL_TABLET | Freq: Every day | ORAL | Status: DC

## 2012-07-04 NOTE — Progress Notes (Signed)
HPI [name] is a 71 year old married white male who is self-referred for cardiac evaluation and also his history of hypertension.  I saw him years ago but cannot find a note.  He is currently fairly active working out in the gym on the stationary bike. He denies any angina or chest discomfort or dyspnea on exertion. He denies any palpitations, presyncope or syncope, unusual headaches, visual changes, symptoms of TIAs or mini strokes, orthopnea, PND or edema.  His risk factors for coronary disease include age, male sex, and hypertension.  He has been evaluated by Dr. Eliott Nine this year for an elevated creatinine. I reviewed all her notes. Because of his muscle mass, if she obtained a 24-hour urine collection which measured his creatinine clearance at 87.8 mL/min. Even though his creatinine has been as high as 2.0 in August of 2011, his average creatinines around 1.4-1.5. She also performed an ultrasound which showed normal size kidneys with left renal calculi. He has had numerous kidney stones in the past which were actually analyzed as calcium oxalate.   She also had an SPEP and UPEP which were negative for a monoclonal spike.  He had been on trimethoprim sulfa for rosacea. Dr. Eliott Nine asked Dr. Yetta Barre to discontinue this.  She did increase his diltiazem from 240 mg a day to 360 mg a day. His blood pressure has been as high as in the 170s when evaluated in August of 2012 by Dr. Uvaldo Rising at [facility]. He's had several blood pressures in the 150-158 range in Dr. Elza Rafter clinic.    Past Medical History  Diagnosis Date  . Obstructive sleep apnea   . Gout   . Hypertension   . Benign prostatic hypertrophy   . Rosacea, acne   . Elevated PSA   . Nephrolithiasis     hx of  . Erectile dysfunction   . Partial Achilles tendon tear   . Diabetes mellitus type II     no meds    Current Outpatient Prescriptions  Medication Sig Dispense Refill  . allopurinol (ZYLOPRIM) 100 MG tablet Take 1 tablet  (100 mg total) by mouth daily.  30 tablet  2  . diltiazem (CARDIZEM CD) 240 MG 24 hr capsule Take 360 mg by mouth daily.      . finasteride (PROSCAR) 5 MG tablet Take 5 mg by mouth daily.        . Magnesium 250 MG TABS Take 1 tablet by mouth daily.      Letta Pate VERIO test strip       . polyethylene glycol (MIRALAX) powder Take by mouth as needed. Once daily as directed      . DISCONTD: diltiazem (CARDIZEM CD) 240 MG 24 hr capsule Take 1 capsule (240 mg total) by mouth daily.  14 capsule  0  . lisinopril (PRINIVIL,ZESTRIL) 10 MG tablet Take 1 tablet (10 mg total) by mouth daily.  30 tablet  11    Allergies  Allergen Reactions  . Ciprofloxacin     REACTION: Diarrhea,l rectal irritation    Family History  Problem Relation Age of Onset  . Thalassemia Son   . Colon cancer Neg Hx   . Stomach cancer Neg Hx     History   Social History  . Marital Status: Married    Spouse Name: N/A    Number of Children: N/A  . Years of Education: N/A   Occupational History  . Not on file.   Social History Main Topics  . Smoking status: Never  Smoker   . Smokeless tobacco: Never Used  . Alcohol Use: Yes     rarely  . Drug Use: No  . Sexually Active: Not on file   Other Topics Concern  . Not on file   Social History Narrative   Regular exercise. Avoid Ibuprofen, naproxen, NSAIDS.Designated Party Release signed on 03/03/2010.    ROS ALL NEGATIVE EXCEPT THOSE NOTED IN HPI  PE  General Appearance: well developed, well nourished in no acute distress, muscular HEENT: symmetrical face, PERRLA, good dentition  Neck: no JVD, thyromegaly, or adenopathy, trachea midline Chest: symmetric without deformity Cardiac: PMI non-displaced, RRR, normal S1, S2, no gallop or murmur Lung: clear to ausculation and percussion Vascular: all pulses full without bruits  Abdominal: nondistended, nontender, good bowel sounds, no HSM, no bruits Extremities: no cyanosis, clubbing , chronic edematous changes  without pitting in the left lower extremity, several incisions around the left knee from previous surgeries, no sign of DVT, superficial varicosities of the left lower extremity  Skin: normal color, no rashes Neuro: alert and oriented x 3, non-focal Pysch: normal affect  EKG Normal sinus rhythm, left anterior fascicular block BMET    Component Value Date/Time   NA 142 03/12/2012 0933   K 4.7 03/12/2012 0933   CL 107 03/12/2012 0933   CO2 27 03/12/2012 0933   GLUCOSE 119* 03/12/2012 0933   GLUCOSE 151* 08/21/2006 1529   BUN 25* 03/12/2012 0933   CREATININE 1.5 03/12/2012 0933   CALCIUM 9.0 03/12/2012 0933   GFRNONAA 34* 10/30/2011 0917   GFRAA 39* 10/30/2011 0917    Lipid Panel     Component Value Date/Time   CHOL 130 03/12/2012 0933   TRIG 83.0 03/12/2012 0933   HDL 36.40* 03/12/2012 0933   CHOLHDL 4 03/12/2012 0933   VLDL 16.6 03/12/2012 0933   LDLCALC 77 03/12/2012 0933    CBC    Component Value Date/Time   WBC 6.8 06/06/2011 1135   RBC 6.14* 06/06/2011 1135   HGB 11.6* 10/31/2011 0821   HCT 42.9 06/06/2011 1135   PLT 201.0 06/06/2011 1135   MCV 69.9* 06/06/2011 1135   MCH 22.2* 04/07/2010 0445   MCHC 32.0 06/06/2011 1135   RDW 17.5* 06/06/2011 1135   LYMPHSABS 1.5 06/06/2011 1135   MONOABS 1.0 06/06/2011 1135   EOSABS 0.1 06/06/2011 1135   BASOSABS 0.0 06/06/2011 1135

## 2012-07-04 NOTE — Assessment & Plan Note (Signed)
His systolic blood pressure is suboptimally controlled. I have added lisinopril 10 mg per day and we'll have him return in a week for a metabolic profile being careful watching his renal function. We'll also check his blood pressure that time. I've told him and his wife that I think this will help preserve renal function in the future. As pointed out by Dr. Eliott Nine, his renal function is probably normal for his age and muscle status. I still think you be good to get his blood pressure down further and reduce his risk of future renal dysfunction. He is more than likely going to continue to have kidney stones as well. His wife and he are comfortable with this plan.

## 2012-07-04 NOTE — Patient Instructions (Addendum)
Your physician has recommended you make the following change in your medication: Start Lisinopril 10mg  daily  Your physician recommends that you return for lab work on October 3,2013 for a BMP and a nurse room visit for a blood pressure check  Continue all of your other medications.  Follow-up with Dr. Daleen Squibb as needed.

## 2012-07-05 ENCOUNTER — Institutional Professional Consult (permissible substitution): Payer: Medicare Other | Admitting: Cardiology

## 2012-07-11 ENCOUNTER — Other Ambulatory Visit (INDEPENDENT_AMBULATORY_CARE_PROVIDER_SITE_OTHER): Payer: Medicare Other

## 2012-07-11 ENCOUNTER — Ambulatory Visit (INDEPENDENT_AMBULATORY_CARE_PROVIDER_SITE_OTHER): Payer: Medicare Other | Admitting: *Deleted

## 2012-07-11 ENCOUNTER — Encounter: Payer: Self-pay | Admitting: *Deleted

## 2012-07-11 VITALS — BP 142/76 | HR 66 | Ht 74.5 in | Wt 266.8 lb

## 2012-07-11 DIAGNOSIS — I10 Essential (primary) hypertension: Secondary | ICD-10-CM

## 2012-07-11 LAB — BASIC METABOLIC PANEL
Chloride: 108 mEq/L (ref 96–112)
GFR: 45.85 mL/min — ABNORMAL LOW (ref >60.00)
Potassium: 4.1 mEq/L (ref 3.5–5.1)
Sodium: 140 mEq/L (ref 135–145)

## 2012-07-18 ENCOUNTER — Other Ambulatory Visit: Payer: Medicare Other

## 2012-08-08 ENCOUNTER — Ambulatory Visit (INDEPENDENT_AMBULATORY_CARE_PROVIDER_SITE_OTHER): Payer: Medicare Other | Admitting: Internal Medicine

## 2012-08-08 DIAGNOSIS — Z23 Encounter for immunization: Secondary | ICD-10-CM

## 2012-08-29 ENCOUNTER — Other Ambulatory Visit: Payer: Self-pay | Admitting: Dermatology

## 2012-08-29 DIAGNOSIS — D1801 Hemangioma of skin and subcutaneous tissue: Secondary | ICD-10-CM | POA: Diagnosis not present

## 2012-08-29 DIAGNOSIS — L570 Actinic keratosis: Secondary | ICD-10-CM | POA: Diagnosis not present

## 2012-08-29 DIAGNOSIS — L719 Rosacea, unspecified: Secondary | ICD-10-CM | POA: Diagnosis not present

## 2012-08-29 DIAGNOSIS — L821 Other seborrheic keratosis: Secondary | ICD-10-CM | POA: Diagnosis not present

## 2012-08-29 DIAGNOSIS — D239 Other benign neoplasm of skin, unspecified: Secondary | ICD-10-CM | POA: Diagnosis not present

## 2012-08-29 DIAGNOSIS — D2330 Other benign neoplasm of skin of unspecified part of face: Secondary | ICD-10-CM | POA: Diagnosis not present

## 2012-08-29 DIAGNOSIS — D485 Neoplasm of uncertain behavior of skin: Secondary | ICD-10-CM | POA: Diagnosis not present

## 2012-09-30 ENCOUNTER — Telehealth: Payer: Self-pay | Admitting: Internal Medicine

## 2012-09-30 NOTE — Telephone Encounter (Signed)
No follow up required, closing encounter. °

## 2012-09-30 NOTE — Telephone Encounter (Signed)
Returned call per dispatch.  Phone answered by resident stating that the patient and spouse were not at home.  Message left that if further assistance is needed to have them return call.

## 2012-10-01 ENCOUNTER — Ambulatory Visit (INDEPENDENT_AMBULATORY_CARE_PROVIDER_SITE_OTHER): Payer: Medicare Other | Admitting: Internal Medicine

## 2012-10-01 ENCOUNTER — Encounter: Payer: Self-pay | Admitting: Internal Medicine

## 2012-10-01 VITALS — BP 172/84 | Temp 98.0°F | Wt 267.0 lb

## 2012-10-01 DIAGNOSIS — E119 Type 2 diabetes mellitus without complications: Secondary | ICD-10-CM | POA: Diagnosis not present

## 2012-10-01 DIAGNOSIS — E785 Hyperlipidemia, unspecified: Secondary | ICD-10-CM

## 2012-10-01 DIAGNOSIS — I10 Essential (primary) hypertension: Secondary | ICD-10-CM | POA: Diagnosis not present

## 2012-10-01 DIAGNOSIS — N289 Disorder of kidney and ureter, unspecified: Secondary | ICD-10-CM | POA: Diagnosis not present

## 2012-10-01 LAB — CBC WITH DIFFERENTIAL/PLATELET
Basophils Absolute: 0 10*3/uL (ref 0.0–0.1)
Basophils Relative: 0.4 % (ref 0.0–3.0)
Eosinophils Absolute: 0.1 10*3/uL (ref 0.0–0.7)
Lymphocytes Relative: 16.9 % (ref 12.0–46.0)
MCHC: 31.5 g/dL (ref 30.0–36.0)
MCV: 68.7 fl — ABNORMAL LOW (ref 78.0–100.0)
Monocytes Absolute: 1.3 10*3/uL — ABNORMAL HIGH (ref 0.1–1.0)
Neutrophils Relative %: 66.5 % (ref 43.0–77.0)
Platelets: 187 10*3/uL (ref 150.0–400.0)
RDW: 16.8 % — ABNORMAL HIGH (ref 11.5–14.6)

## 2012-10-01 LAB — BASIC METABOLIC PANEL
CO2: 23 mEq/L (ref 19–32)
Glucose, Bld: 100 mg/dL — ABNORMAL HIGH (ref 70–99)
Potassium: 4.1 mEq/L (ref 3.5–5.1)
Sodium: 135 mEq/L (ref 135–145)

## 2012-10-01 LAB — HEPATIC FUNCTION PANEL
AST: 20 U/L (ref 0–37)
Albumin: 3.9 g/dL (ref 3.5–5.2)
Alkaline Phosphatase: 68 U/L (ref 39–117)
Bilirubin, Direct: 0.1 mg/dL (ref 0.0–0.3)
Total Protein: 6.7 g/dL (ref 6.0–8.3)

## 2012-10-01 LAB — LIPID PANEL
HDL: 30.8 mg/dL — ABNORMAL LOW (ref >39.00)
Total CHOL/HDL Ratio: 5
Triglycerides: 122 mg/dL (ref 0.0–149.0)

## 2012-10-01 NOTE — Progress Notes (Signed)
Patient ID: [name], male   DOB: 09-Feb-1941, 72 y.o.   MRN: [id number] 2 week hx of not feeling well. Has felt unsteady and sweaty at times- these sxs "come on" and resolve spontaneously.  No chest pain, no sob.   Even when he isn't having an acute episode he feels poorly-- difficult to explain.  BPs have been checked at home: 150s/80s Home CBGs- 120 range  Past Medical History  Diagnosis Date  . Obstructive sleep apnea   . Gout   . Hypertension   . Benign prostatic hypertrophy   . Rosacea, acne   . Elevated PSA   . Nephrolithiasis     hx of  . Erectile dysfunction   . Partial Achilles tendon tear   . Diabetes mellitus type II     no meds    History   Social History  . Marital Status: Married    Spouse Name: N/A    Number of Children: N/A  . Years of Education: N/A   Occupational History  . Not on file.   Social History Main Topics  . Smoking status: Never Smoker   . Smokeless tobacco: Never Used  . Alcohol Use: Yes     Comment: rarely  . Drug Use: No  . Sexually Active: Not on file   Other Topics Concern  . Not on file   Social History Narrative   Regular exercise. Avoid Ibuprofen, naproxen, NSAIDS.Designated Party Release signed on 03/03/2010.    Past Surgical History  Procedure Date  . Carpal tunnel release 2004  . Inguinal hernia repair   . Transurethral resection of prostate   . Tonsillectomy and adenoidectomy   . Hand surgery 2007    right hand  . Knee surgery 1961    left knee  . Joint replacement     left total knee  . Tonsillectomy   . Eye surgery     cataracts  . Carpal tunnel release 10/31/2011    Procedure: CARPAL TUNNEL RELEASE;  Surgeon: Wyn Forster., MD;  Location: [facility];  Service: Orthopedics;  Laterality: Left;    Family History  Problem Relation Age of Onset  . Thalassemia Son   . Colon cancer Neg Hx   . Stomach cancer Neg Hx     Allergies  Allergen Reactions  . Ciprofloxacin    REACTION: Diarrhea,l rectal irritation    Current Outpatient Prescriptions on File Prior to Visit  Medication Sig Dispense Refill  . allopurinol (ZYLOPRIM) 100 MG tablet Take 100 mg by mouth daily.      Marland Kitchen diltiazem (CARDIZEM CD) 240 MG 24 hr capsule Take 360 mg by mouth daily.      . finasteride (PROSCAR) 5 MG tablet Take 5 mg by mouth daily.        Marland Kitchen lisinopril (PRINIVIL,ZESTRIL) 10 MG tablet Take 1 tablet (10 mg total) by mouth daily.  30 tablet  11  . Magnesium 250 MG TABS Take 1 tablet by mouth daily.      Letta Pate VERIO test strip          patient denies chest pain, shortness of breath, orthopnea. Denies lower extremity edema, abdominal pain, change in appetite, change in bowel movements. Patient denies rashes, musculoskeletal complaints. No other specific complaints in a complete review of systems.   BP 172/84  Temp 98 F (36.7 C) (Oral)  Wt 267 lb (121.11 kg)  overweight male in no acute distress. HEENT exam atraumatic, normocephalic, neck supple without  jugular venous distention. Chest clear to auscultation cardiac exam S1-S2 are regular. Abdominal exam overweight with bowel sounds, soft and nontender. Extremities no edema. Neurologic exam is alert with a normal gait.  Assessment and plan: Unusual story of dizziness and a general sensation of just not feeling well. Even though symptoms are somewhat vague I think he needs further evaluation. Start with blood work. I think he may need imaging of his brain to rule out stroke. It's even possible that he has a CNS bleed even though has not had any trauma.

## 2012-10-04 ENCOUNTER — Other Ambulatory Visit: Payer: Self-pay | Admitting: Internal Medicine

## 2012-10-04 DIAGNOSIS — R410 Disorientation, unspecified: Secondary | ICD-10-CM

## 2012-10-04 DIAGNOSIS — R42 Dizziness and giddiness: Secondary | ICD-10-CM

## 2012-10-05 ENCOUNTER — Ambulatory Visit
Admission: RE | Admit: 2012-10-05 | Discharge: 2012-10-05 | Disposition: A | Discharge status: Home / Self Care | Payer: Medicare Other | Source: Ambulatory Visit | Attending: Internal Medicine | Admitting: Internal Medicine

## 2012-10-05 DIAGNOSIS — R42 Dizziness and giddiness: Secondary | ICD-10-CM | POA: Diagnosis not present

## 2012-10-05 DIAGNOSIS — R55 Syncope and collapse: Secondary | ICD-10-CM | POA: Diagnosis not present

## 2012-10-05 DIAGNOSIS — R410 Disorientation, unspecified: Secondary | ICD-10-CM

## 2012-10-07 ENCOUNTER — Telehealth: Payer: Self-pay | Admitting: Internal Medicine

## 2012-10-07 DIAGNOSIS — Z79899 Other long term (current) drug therapy: Secondary | ICD-10-CM | POA: Diagnosis not present

## 2012-10-07 DIAGNOSIS — I10 Essential (primary) hypertension: Secondary | ICD-10-CM | POA: Diagnosis not present

## 2012-10-07 DIAGNOSIS — R55 Syncope and collapse: Secondary | ICD-10-CM | POA: Diagnosis not present

## 2012-10-07 DIAGNOSIS — Z7982 Long term (current) use of aspirin: Secondary | ICD-10-CM | POA: Diagnosis not present

## 2012-10-07 NOTE — Telephone Encounter (Signed)
Dr Lovell Sheehan sent wife a message on phone

## 2012-10-07 NOTE — Telephone Encounter (Signed)
Husband had MRI on 12/28. Wife would like to know the results. Since Dr. Cato Mulligan and Arline Asp are out, she wanted to know if Dr. Lovell Sheehan would look at it and someone could call her. Please advise. Thanks.

## 2012-10-22 ENCOUNTER — Telehealth: Payer: Self-pay | Admitting: Cardiology

## 2012-10-24 ENCOUNTER — Ambulatory Visit (INDEPENDENT_AMBULATORY_CARE_PROVIDER_SITE_OTHER): Payer: Medicare Other | Admitting: Cardiology

## 2012-10-24 ENCOUNTER — Encounter: Payer: Self-pay | Admitting: Cardiology

## 2012-10-24 VITALS — BP 142/78 | HR 78 | Ht 74.5 in | Wt 270.0 lb

## 2012-10-24 DIAGNOSIS — R55 Syncope and collapse: Secondary | ICD-10-CM

## 2012-10-24 DIAGNOSIS — I10 Essential (primary) hypertension: Secondary | ICD-10-CM

## 2012-10-24 NOTE — Progress Notes (Signed)
HPI [name] comes in today with several episodes of feeling hot, lightheaded, and feeling like he was going to pass out. He's had this evaluated with blood work which is been unremarkable, an MRI scan which did not show any focal disease. His wife stopped his lisinopril thinking that it was causing this reaction. His blood pressures been under poor control at every visit he's had this we started other than mine.  He brings blood work and from an emergency room hospital visit in [place]. It is unremarkable his creatinine is stable at 1.5. His troponin was negative.  He has excess fatigue but has not had any significant symptoms of chest pain increased dyspnea on exertion. He has not exercised over a couple weeks for fear  Past Medical History  Diagnosis Date  . Obstructive sleep apnea   . Gout   . Hypertension   . Benign prostatic hypertrophy   . Rosacea, acne   . Elevated PSA   . Nephrolithiasis     hx of  . Erectile dysfunction   . Partial Achilles tendon tear   . Diabetes mellitus type II     no meds    Current Outpatient Prescriptions  Medication Sig Dispense Refill  . allopurinol (ZYLOPRIM) 100 MG tablet Take 100 mg by mouth daily.      Marland Kitchen diltiazem (CARDIZEM CD) 240 MG 24 hr capsule Take 360 mg by mouth daily.      . finasteride (PROSCAR) 5 MG tablet Take 5 mg by mouth daily.        . Magnesium 250 MG TABS Take 1 tablet by mouth daily.      Letta Pate VERIO test strip       . lisinopril (PRINIVIL,ZESTRIL) 10 MG tablet Take 1 tablet (10 mg total) by mouth daily.  30 tablet  11    Allergies  Allergen Reactions  . Ciprofloxacin     REACTION: Diarrhea,l rectal irritation    Family History  Problem Relation Age of Onset  . Thalassemia Son   . Colon cancer Neg Hx   . Stomach cancer Neg Hx     History   Social History  . Marital Status: Married    Spouse Name: N/A    Number of Children: N/A  . Years of Education: N/A   Occupational History  . Not on file.     Social History Main Topics  . Smoking status: Never Smoker   . Smokeless tobacco: Never Used  . Alcohol Use: Yes     Comment: rarely  . Drug Use: No  . Sexually Active: Not on file   Other Topics Concern  . Not on file   Social History Narrative   Regular exercise. Avoid Ibuprofen, naproxen, NSAIDS.Designated Party Release signed on 03/03/2010.    ROS ALL NEGATIVE EXCEPT THOSE NOTED IN HPI  PE  General Appearance: well developed, well nourished in no acute distress HEENT: symmetrical face, PERRLA, good dentition  Neck: no JVD, thyromegaly, or adenopathy, trachea midline Chest: symmetric without deformity Cardiac: PMI non-displaced, RRR, normal S1, S2, no gallop or murmur Lung: clear to ausculation and percussion Vascular: all pulses full without bruits  Abdominal: nondistended, nontender, good bowel sounds, no HSM, no bruits Extremities: no cyanosis, clubbing or edema, no sign of DVT, no varicosities  Skin: normal color, no rashes Neuro: alert and oriented x 3, non-focal Pysch: normal affect  EKG  BMET    Component Value Date/Time   NA 135 10/01/2012 1022   K 4.1  10/01/2012 1022   CL 103 10/01/2012 1022   CO2 23 10/01/2012 1022   GLUCOSE 100* 10/01/2012 1022   GLUCOSE 151* 08/21/2006 1529   BUN 27* 10/01/2012 1022   CREATININE 1.5 10/01/2012 1022   CALCIUM 9.2 10/01/2012 1022   GFRNONAA 34* 10/30/2011 0917   GFRAA 39* 10/30/2011 0917    Lipid Panel     Component Value Date/Time   CHOL 139 10/01/2012 1022   TRIG 122.0 10/01/2012 1022   HDL 30.80* 10/01/2012 1022   CHOLHDL 5 10/01/2012 1022   VLDL 24.4 10/01/2012 1022   LDLCALC 84 10/01/2012 1022    CBC    Component Value Date/Time   WBC 8.5 10/01/2012 1022   RBC 6.28 Repeated and verified X2.* 10/01/2012 1022   HGB 13.6 10/01/2012 1022   HCT 43.1 10/01/2012 1022   PLT 187.0 10/01/2012 1022   MCV 68.7 Repeated and verified X2.* 10/01/2012 1022   MCH 22.2* 04/07/2010 0445   MCHC 31.5 10/01/2012  1022   RDW 16.8* 10/01/2012 1022   LYMPHSABS 1.4 10/01/2012 1022   MONOABS 1.3* 10/01/2012 1022   EOSABS 0.1 10/01/2012 1022   BASOSABS 0.0 10/01/2012 1022

## 2012-10-24 NOTE — Patient Instructions (Addendum)
Your physician has requested that you have en exercise stress myoview. For further information please visit [email or web address]. Please follow instruction sheet, as given.   Your physician recommends that you continue on your current medications as directed. Please refer to the Current Medication list given to you today. 

## 2012-10-24 NOTE — Assessment & Plan Note (Signed)
He really has had several episodes of near syncope. Considering his age and risk factors, when he Ischemic heart disease. He's also had excess fatigue.  Arrange and have an exercise stress Myoview. He will start back on his lisinopril for better blood pressure control. No need for blood work followup since creatinine has been stable.

## 2012-10-31 ENCOUNTER — Ambulatory Visit (HOSPITAL_COMMUNITY): Payer: Medicare Other | Attending: Cardiology | Admitting: Radiology

## 2012-10-31 VITALS — BP 150/73 | HR 57 | Ht 75.0 in | Wt 265.0 lb

## 2012-10-31 DIAGNOSIS — E119 Type 2 diabetes mellitus without complications: Secondary | ICD-10-CM | POA: Insufficient documentation

## 2012-10-31 DIAGNOSIS — I10 Essential (primary) hypertension: Secondary | ICD-10-CM | POA: Diagnosis not present

## 2012-10-31 DIAGNOSIS — I4949 Other premature depolarization: Secondary | ICD-10-CM

## 2012-10-31 DIAGNOSIS — R61 Generalized hyperhidrosis: Secondary | ICD-10-CM | POA: Diagnosis not present

## 2012-10-31 DIAGNOSIS — R55 Syncope and collapse: Secondary | ICD-10-CM

## 2012-10-31 MED ORDER — TECHNETIUM TC 99M SESTAMIBI GENERIC - CARDIOLITE
8.9000 | Freq: Once | INTRAVENOUS | Status: AC | PRN
  Administered 2012-10-31: 9 via INTRAVENOUS

## 2012-10-31 MED ORDER — TECHNETIUM TC 99M SESTAMIBI GENERIC - CARDIOLITE
31.3000 | Freq: Once | INTRAVENOUS | Status: AC | PRN
  Administered 2012-10-31: 31.3 via INTRAVENOUS

## 2012-10-31 NOTE — Progress Notes (Signed)
[facility] SITE 3 NUCLEAR MED [address] [place], [place] [address] [phone]    Cardiology Nuclear Med Study  [name] is a 72 y.o. male     MRN : [id number]     DOB: 1941-06-08  Procedure Date: 10/31/2012  Nuclear Med Background Indication for Stress Test:  Evaluation for Ischemia History:  No previously documented CAD; 2 weeks ago ED in [facility] for near syncope, negative enzymes Cardiac Risk Factors: Hypertension, Lipids, NIDDM and Obesity  Symptoms:  Diaphoresis, Fatigue, Light-Headedness and Near Syncope   Nuclear Pre-Procedure Caffeine/Decaff Intake:  None NPO After: 5:30 pm   Lungs:  Clear. O2 Sat: 97% on room air. IV 0.9% NS with Angio Cath:  20g  IV Site: R Antecubital  IV Started by:  Stanton Kidney, EMT-P  Chest Size (in):  50 Cup Size: n/a  Height: 6\' 3"  (1.905 m)  Weight:  265 lb (120.203 kg)  BMI:  Body mass index is 33.12 kg/(m^2). Tech Comments:  AM med's taken, per wife.     Nuclear Med Study 1 or 2 day study: 1 day  Stress Test Type:  Stress  Reading MD: Olga Millers, MD  Order Authorizing Provider:  Valera Castle, MD  Resting Radionuclide: Technetium 18m Sestamibi  Resting Radionuclide Dose: 8.9 mCi   Stress Radionuclide:  Technetium 18m Sestamibi  Stress Radionuclide Dose: 31.3 mCi           Stress Protocol Rest HR: 57 Stress HR: 132  Rest BP: 150/73 Stress BP: 233/113  Exercise Time (min): 6:00 METS: 7.0   Predicted Max HR: 147 bpm % Max HR: 89.8 bpm Rate Pressure Product: 46962    Dose of Adenosine (mg):  n/a Dose of Lexiscan: n/a mg  Dose of Atropine (mg): n/a Dose of Dobutamine: n/a mcg/kg/min (at max HR)  Stress Test Technologist: Smiley Houseman, CMA-N  Nuclear Technologist:  Domenic Polite, CNMT     Rest Procedure:  Myocardial perfusion imaging was performed at rest 45 minutes following the intravenous administration of Technetium 86m Sestamibi.  Rest ECG: NSR, first degree AV block, LAFB.  Stress Procedure:  The  patient exercised on the treadmill utilizing the Bruce Protocol for six minutes. The patient stopped due to fatigue and dyspnea.  He denied any chest pain.  Technetium 56m Sestamibi was injected at peak exercise and myocardial perfusion imaging was performed after a brief delay.  Stress ECG: No significant ST segment change suggestive of ischemia.  QPS Raw Data Images:  Acquisition technically good; LVE. Stress Images:  There is decreased uptake in the apex. Rest Images:  There is decreased uptake in the apex. Subtraction (SDS):  No evidence of ischemia. Transient Ischemic Dilatation (Normal <1.22):  1.03 Lung/Heart Ratio (Normal <0.45):  0.30  Quantitative Gated Spect Images QGS EDV:  150 ml QGS ESV:  58 ml  Impression Exercise Capacity:  Fair exercise capacity. BP Response:  Hypertensive blood pressure response. Clinical Symptoms:  There is dyspnea. ECG Impression:  No significant ST segment change suggestive of ischemia. Comparison with Prior Nuclear Study: No previous nuclear study performed  Overall Impression:  Normal stress nuclear study with a small, mild, fixed apical defect consistent with thinning; no ischemia.  LV Ejection Fraction: 62%.  LV Wall Motion:  NL LV Function; NL Wall Motion  Olga Millers

## 2012-11-04 ENCOUNTER — Telehealth: Payer: Self-pay | Admitting: Cardiology

## 2012-11-04 NOTE — Telephone Encounter (Signed)
Pt' wife calling for results of stress test

## 2012-11-04 NOTE — Telephone Encounter (Signed)
Spoke with patient's wife was told 10/31/12 myoview normal, no ischemia.Wife stated patient still don't feel good,no chest pain.Message sent to Dr.Wall's nurse.

## 2012-11-05 ENCOUNTER — Encounter (INDEPENDENT_AMBULATORY_CARE_PROVIDER_SITE_OTHER): Payer: BC Managed Care – PPO | Admitting: Ophthalmology

## 2012-11-05 DIAGNOSIS — H35039 Hypertensive retinopathy, unspecified eye: Secondary | ICD-10-CM

## 2012-11-05 DIAGNOSIS — E1165 Type 2 diabetes mellitus with hyperglycemia: Secondary | ICD-10-CM | POA: Diagnosis not present

## 2012-11-05 DIAGNOSIS — E11319 Type 2 diabetes mellitus with unspecified diabetic retinopathy without macular edema: Secondary | ICD-10-CM | POA: Diagnosis not present

## 2012-11-05 DIAGNOSIS — H43819 Vitreous degeneration, unspecified eye: Secondary | ICD-10-CM

## 2012-11-05 DIAGNOSIS — E1139 Type 2 diabetes mellitus with other diabetic ophthalmic complication: Secondary | ICD-10-CM | POA: Diagnosis not present

## 2012-11-05 DIAGNOSIS — I10 Essential (primary) hypertension: Secondary | ICD-10-CM | POA: Diagnosis not present

## 2012-11-05 DIAGNOSIS — D3130 Benign neoplasm of unspecified choroid: Secondary | ICD-10-CM

## 2012-11-06 NOTE — Telephone Encounter (Signed)
LMOVM Dr. Daleen Squibb has reviewed all cardiac tests performed and they are normal Mylo Red RN

## 2012-11-25 DIAGNOSIS — N200 Calculus of kidney: Secondary | ICD-10-CM | POA: Diagnosis not present

## 2012-11-25 DIAGNOSIS — IMO0002 Reserved for concepts with insufficient information to code with codable children: Secondary | ICD-10-CM | POA: Diagnosis not present

## 2012-11-25 DIAGNOSIS — N400 Enlarged prostate without lower urinary tract symptoms: Secondary | ICD-10-CM | POA: Diagnosis not present

## 2012-11-25 DIAGNOSIS — N320 Bladder-neck obstruction: Secondary | ICD-10-CM | POA: Diagnosis not present

## 2012-11-25 DIAGNOSIS — N189 Chronic kidney disease, unspecified: Secondary | ICD-10-CM | POA: Diagnosis not present

## 2012-12-16 DIAGNOSIS — M542 Cervicalgia: Secondary | ICD-10-CM | POA: Insufficient documentation

## 2012-12-26 DIAGNOSIS — M542 Cervicalgia: Secondary | ICD-10-CM | POA: Diagnosis not present

## 2012-12-31 DIAGNOSIS — M542 Cervicalgia: Secondary | ICD-10-CM | POA: Diagnosis not present

## 2013-01-02 DIAGNOSIS — M542 Cervicalgia: Secondary | ICD-10-CM | POA: Diagnosis not present

## 2013-01-09 DIAGNOSIS — M542 Cervicalgia: Secondary | ICD-10-CM | POA: Diagnosis not present

## 2013-01-14 DIAGNOSIS — M542 Cervicalgia: Secondary | ICD-10-CM | POA: Diagnosis not present

## 2013-01-16 DIAGNOSIS — M542 Cervicalgia: Secondary | ICD-10-CM | POA: Diagnosis not present

## 2013-01-20 ENCOUNTER — Telehealth: Payer: Self-pay | Admitting: Internal Medicine

## 2013-01-20 DIAGNOSIS — Z961 Presence of intraocular lens: Secondary | ICD-10-CM | POA: Diagnosis not present

## 2013-01-20 DIAGNOSIS — H01009 Unspecified blepharitis unspecified eye, unspecified eyelid: Secondary | ICD-10-CM | POA: Diagnosis not present

## 2013-01-20 MED ORDER — TEMAZEPAM 15 MG PO CAPS: 15.0000 mg | ORAL_CAPSULE | Freq: Every evening | ORAL | Status: DC | PRN

## 2013-01-20 NOTE — Telephone Encounter (Signed)
Per dr Lovell Sheehan ok to call in temazepam 15 mg 1 qhs #30 no refills, rx called in

## 2013-01-20 NOTE — Telephone Encounter (Signed)
Patient Information:  Caller Name: Joyce Gross  Phone: [phone]  Patient: Ryan, Ortiz  Gender: Male  DOB: [birth year]  Age: 72 Years  PCP: Birdie Sons (Adults only)  Office Follow Up:  Does the office need to follow up with this patient?: No  Instructions For The Office: N/A   Symptoms  Reason For Call & Symptoms: Has not sleeping especially  at night for 3-4 weeks.  8 family deaths in the last year, mounds of paperwork with estates and taxes.  Is active but just can't turn off mind, sometimes frustrated.  Using CPAP for 15 years and that does not seem to bother him.  Reviewed Health History In EMR: Yes  Reviewed Medications In EMR: Yes  Reviewed Allergies In EMR: Yes  Reviewed Surgeries / Procedures: Yes  Date of Onset of Symptoms: Unknown  Treatments Tried: walks with dog, is active, exercises  Treatments Tried Worked: No  Guideline(s) Used:  Insomnia  Disposition Per Guideline:   See Within 3 Days in Office  Reason For Disposition Reached:   Patient wants to be seen  Advice Given:  Tips For Good Sleep:  Regular exercise is good for your body and helps you sleep. Do not exercise right before bedtime.  Drink a small glass of warm milk at bedtime.  Take a warm bath or shower before bedtime.  Try to go to bed at the same time each night. More importantly, get up at the same time each morning (don't sleep in).  Tips For Good Sleep - Your Bedroom:  Keep bedroom temperature cool, not warm or cold.  Keep bedroom quiet and dark.  Use a comfortable mattress.  Tips For Good Sleep - When You Can  If you can't fall asleep after 30 minutes, get out of bed and do something relaxing.  Read a book or listen to some soothing music.  When you feel sleepy, go back to bed.  Repeat these steps as needed.  Tips For Good Sleep - What To Avoid:  Avoid caffeine within 6 hours of bedtime.  Avoid alcohol within 4 hours of bedtime.  Avoid heavy meals just before bedtime. Eating a light snack  is OK.  Do not drink over 1 glass of liquid at bedtime (Reason: will need to get up to urinate).  Call Back If:  You become worse.  Patient Will Follow Care Advice:  YES

## 2013-01-21 NOTE — Telephone Encounter (Signed)
appt scheduled

## 2013-01-22 ENCOUNTER — Encounter: Payer: Self-pay | Admitting: Family

## 2013-01-22 ENCOUNTER — Ambulatory Visit (INDEPENDENT_AMBULATORY_CARE_PROVIDER_SITE_OTHER): Payer: Medicare Other | Admitting: Family

## 2013-01-22 VITALS — BP 168/74 | HR 76 | Wt 268.0 lb

## 2013-01-22 DIAGNOSIS — M109 Gout, unspecified: Secondary | ICD-10-CM

## 2013-01-22 DIAGNOSIS — G4700 Insomnia, unspecified: Secondary | ICD-10-CM | POA: Diagnosis not present

## 2013-01-22 DIAGNOSIS — E1149 Type 2 diabetes mellitus with other diabetic neurological complication: Secondary | ICD-10-CM | POA: Diagnosis not present

## 2013-01-22 DIAGNOSIS — I10 Essential (primary) hypertension: Secondary | ICD-10-CM | POA: Diagnosis not present

## 2013-01-22 LAB — BASIC METABOLIC PANEL
CO2: 26 mEq/L (ref 19–32)
Chloride: 106 mEq/L (ref 96–112)
Sodium: 140 mEq/L (ref 135–145)

## 2013-01-22 LAB — HEPATIC FUNCTION PANEL
ALT: 22 U/L (ref 0–53)
Alkaline Phosphatase: 69 U/L (ref 39–117)
Bilirubin, Direct: 0.1 mg/dL (ref 0.0–0.3)
Total Bilirubin: 1 mg/dL (ref 0.3–1.2)
Total Protein: 7 g/dL (ref 6.0–8.3)

## 2013-01-22 LAB — HEMOGLOBIN A1C: Hgb A1c MFr Bld: 6.6 % — ABNORMAL HIGH (ref 4.6–6.5)

## 2013-01-22 MED ORDER — LISINOPRIL 20 MG PO TABS: 20.0000 mg | ORAL_TABLET | Freq: Every day | ORAL | Status: DC

## 2013-01-22 NOTE — Progress Notes (Signed)
Subjective:    Patient ID: [name], male    DOB: 1940-12-10, 71 y.o.   MRN: [id number]  HPI 72 year old WM, nonsmoker, patient of Dr. Cato Mulligan is in today with c/o insomnia. Dr. Cato Mulligan called in a RX for Restoril 2 days ago that appears to be working well so far having . Wife believes he is depressed. Patient believes he is just having trouble sleeping. Has had 8 deaths in the last 1 year. He is also concerned about his blood pressure increasing and A1C not being checked. He has not had a fasting follow-up since December 2013. Tries to follow a healthy diet but does not routinely exercise.   Review of Systems  Constitutional: Negative.   Respiratory: Negative.   Cardiovascular: Negative.        Concerns of elevated blood pressure  Gastrointestinal: Negative.   Endocrine: Negative.   Genitourinary: Negative.   Musculoskeletal: Negative.   Skin: Negative.   Allergic/Immunologic: Negative.   Neurological: Negative.   Hematological: Negative.   Psychiatric/Behavioral: Positive for sleep disturbance, decreased concentration and agitation. Negative for suicidal ideas and self-injury. The patient is not nervous/anxious.    Past Medical History  Diagnosis Date  . Obstructive sleep apnea   . Gout   . Hypertension   . Benign prostatic hypertrophy   . Rosacea, acne   . Elevated PSA   . Nephrolithiasis     hx of  . Erectile dysfunction   . Partial Achilles tendon tear   . Diabetes mellitus type II     no meds    History   Social History  . Marital Status: Married    Spouse Name: N/A    Number of Children: N/A  . Years of Education: N/A   Occupational History  . Not on file.   Social History Main Topics  . Smoking status: Never Smoker   . Smokeless tobacco: Never Used  . Alcohol Use: Yes     Comment: rarely  . Drug Use: No  . Sexually Active: Not on file   Other Topics Concern  . Not on file   Social History Narrative   Regular exercise.    Avoid Ibuprofen,  naproxen, NSAIDS.   Designated Party Release signed on 03/03/2010.    Past Surgical History  Procedure Laterality Date  . Carpal tunnel release  2004  . Inguinal hernia repair    . Transurethral resection of prostate    . Tonsillectomy and adenoidectomy    . Hand surgery  2007    right hand  . Knee surgery  1961    left knee  . Joint replacement      left total knee  . Tonsillectomy    . Eye surgery      cataracts  . Carpal tunnel release  10/31/2011    Procedure: CARPAL TUNNEL RELEASE;  Surgeon: Wyn Forster., MD;  Location: [facility];  Service: Orthopedics;  Laterality: Left;    Family History  Problem Relation Age of Onset  . Thalassemia Son   . Colon cancer Neg Hx   . Stomach cancer Neg Hx     Allergies  Allergen Reactions  . Ciprofloxacin     REACTION: Diarrhea,l rectal irritation    Current Outpatient Prescriptions on File Prior to Visit  Medication Sig Dispense Refill  . allopurinol (ZYLOPRIM) 100 MG tablet Take 100 mg by mouth daily.      Marland Kitchen diltiazem (CARDIZEM CD) 240 MG 24 hr capsule Take  360 mg by mouth daily.      . finasteride (PROSCAR) 5 MG tablet Take 5 mg by mouth daily.        . Magnesium 250 MG TABS Take 1 tablet by mouth daily.      Letta Pate VERIO test strip       . temazepam (RESTORIL) 15 MG capsule Take 1 capsule (15 mg total) by mouth at bedtime as needed for sleep.  30 capsule  0   No current facility-administered medications on file prior to visit.    BP 168/74  Pulse 76  Wt 268 lb (121.564 kg)  BMI 33.5 kg/m2  SpO2 98%chart    Objective:   Physical Exam  Constitutional: He is oriented to person, place, and time. He appears well-developed and well-nourished.  HENT:  Right Ear: External ear normal.  Left Ear: External ear normal.  Mouth/Throat: Oropharynx is clear and moist.  Eyes: Conjunctivae are normal. Pupils are equal, round, and reactive to light.  Neck: Normal range of motion. Neck supple.   Cardiovascular: Normal rate, regular rhythm and normal heart sounds.   Pulmonary/Chest: Effort normal and breath sounds normal.  Abdominal: Soft. Bowel sounds are normal. He exhibits no distension. There is no tenderness. There is no rebound.  Musculoskeletal: Normal range of motion.  Neurological: He is alert and oriented to person, place, and time. He has normal reflexes.  Skin: Skin is warm and dry.  Psychiatric: He has a normal mood and affect.          Assessment & Plan:  Assessment:  1. Insomnia 2. Type 2 Diabetes 3. Hypertension Uncontrolled   Plan: Continue Restoril since it is helping. We may consider an anti-depressant in the future. Labs will be obtained today. Lipids will be deferred to when he is fasting. Increase Lisinopril to 20mg  daily. Recheck in 3 weeks and sooner as needed.

## 2013-01-22 NOTE — Patient Instructions (Addendum)
1. Increase blood medication to 20mg  daily.  Hypertension As your heart beats, it forces blood through your arteries. This force is your blood pressure. If the pressure is too high, it is called hypertension (HTN) or high blood pressure. HTN is dangerous because you may have it and not know it. High blood pressure may mean that your heart has to work harder to pump blood. Your arteries may be narrow or stiff. The extra work puts you at risk for heart disease, stroke, and other problems.  Blood pressure consists of two numbers, a higher number over a lower, 110/72, for example. It is stated as "110 over 72." The ideal is below 120 for the top number (systolic) and under 80 for the bottom (diastolic). Write down your blood pressure today. You should pay close attention to your blood pressure if you have certain conditions such as:  Heart failure.  Prior heart attack.  Diabetes  Chronic kidney disease.  Prior stroke.  Multiple risk factors for heart disease. To see if you have HTN, your blood pressure should be measured while you are seated with your arm held at the level of the heart. It should be measured at least twice. A one-time elevated blood pressure reading (especially in the Emergency Department) does not mean that you need treatment. There may be conditions in which the blood pressure is different between your right and left arms. It is important to see your caregiver soon for a recheck. Most people have essential hypertension which means that there is not a specific cause. This type of high blood pressure may be lowered by changing lifestyle factors such as:  Stress.  Smoking.  Lack of exercise.  Excessive weight.  Drug/tobacco/alcohol use.  Eating less salt. Most people do not have symptoms from high blood pressure until it has caused damage to the body. Effective treatment can often prevent, delay or reduce that damage. TREATMENT  When a cause has been identified,  treatment for high blood pressure is directed at the cause. There are a large number of medications to treat HTN. These fall into several categories, and your caregiver will help you select the medicines that are best for you. Medications may have side effects. You should review side effects with your caregiver. If your blood pressure stays high after you have made lifestyle changes or started on medicines,   Your medication(s) may need to be changed.  Other problems may need to be addressed.  Be certain you understand your prescriptions, and know how and when to take your medicine.  Be sure to follow up with your caregiver within the time frame advised (usually within two weeks) to have your blood pressure rechecked and to review your medications.  If you are taking more than one medicine to lower your blood pressure, make sure you know how and at what times they should be taken. Taking two medicines at the same time can result in blood pressure that is too low. SEEK IMMEDIATE MEDICAL CARE IF:  You develop a severe headache, blurred or changing vision, or confusion.  You have unusual weakness or numbness, or a faint feeling.  You have severe chest or abdominal pain, vomiting, or breathing problems. MAKE SURE YOU:   Understand these instructions.  Will watch your condition.  Will get help right away if you are not doing well or get worse. Document Released: 09/25/2005 Document Revised: 12/18/2011 Document Reviewed: 05/15/2008 [facility] Patient Information 2013 [place], [place].

## 2013-01-23 LAB — LIPID PANEL
LDL Cholesterol: 86 mg/dL (ref 0–99)
Total CHOL/HDL Ratio: 5
Triglycerides: 84 mg/dL (ref 0.0–149.0)
VLDL: 16.8 mg/dL (ref 0.0–40.0)

## 2013-01-23 NOTE — Addendum Note (Signed)
Addended by: Bonnye Fava on: 01/23/2013 08:32 AM   Modules accepted: Orders

## 2013-02-27 DIAGNOSIS — L819 Disorder of pigmentation, unspecified: Secondary | ICD-10-CM | POA: Diagnosis not present

## 2013-02-27 DIAGNOSIS — L719 Rosacea, unspecified: Secondary | ICD-10-CM | POA: Diagnosis not present

## 2013-02-27 DIAGNOSIS — D1801 Hemangioma of skin and subcutaneous tissue: Secondary | ICD-10-CM | POA: Diagnosis not present

## 2013-02-27 DIAGNOSIS — Z85828 Personal history of other malignant neoplasm of skin: Secondary | ICD-10-CM | POA: Diagnosis not present

## 2013-02-27 DIAGNOSIS — L821 Other seborrheic keratosis: Secondary | ICD-10-CM | POA: Diagnosis not present

## 2013-02-27 DIAGNOSIS — D239 Other benign neoplasm of skin, unspecified: Secondary | ICD-10-CM | POA: Diagnosis not present

## 2013-03-18 DIAGNOSIS — M542 Cervicalgia: Secondary | ICD-10-CM | POA: Diagnosis not present

## 2013-03-26 DIAGNOSIS — H01009 Unspecified blepharitis unspecified eye, unspecified eyelid: Secondary | ICD-10-CM | POA: Diagnosis not present

## 2013-03-26 DIAGNOSIS — Z961 Presence of intraocular lens: Secondary | ICD-10-CM | POA: Diagnosis not present

## 2013-06-13 DIAGNOSIS — N200 Calculus of kidney: Secondary | ICD-10-CM | POA: Diagnosis not present

## 2013-07-10 DIAGNOSIS — R413 Other amnesia: Secondary | ICD-10-CM | POA: Diagnosis not present

## 2013-07-10 DIAGNOSIS — E559 Vitamin D deficiency, unspecified: Secondary | ICD-10-CM | POA: Diagnosis not present

## 2013-07-10 DIAGNOSIS — R7309 Other abnormal glucose: Secondary | ICD-10-CM | POA: Diagnosis not present

## 2013-07-10 DIAGNOSIS — Z79899 Other long term (current) drug therapy: Secondary | ICD-10-CM | POA: Diagnosis not present

## 2013-07-10 DIAGNOSIS — Z23 Encounter for immunization: Secondary | ICD-10-CM | POA: Diagnosis not present

## 2013-07-10 DIAGNOSIS — I10 Essential (primary) hypertension: Secondary | ICD-10-CM | POA: Diagnosis not present

## 2013-07-10 LAB — CBC AND DIFFERENTIAL: WBC: 8.4 10^3/mL

## 2013-08-11 ENCOUNTER — Other Ambulatory Visit: Payer: Self-pay | Admitting: Cardiology

## 2013-08-14 DIAGNOSIS — R413 Other amnesia: Secondary | ICD-10-CM | POA: Diagnosis not present

## 2013-08-14 DIAGNOSIS — G609 Hereditary and idiopathic neuropathy, unspecified: Secondary | ICD-10-CM | POA: Diagnosis not present

## 2013-08-14 DIAGNOSIS — G4733 Obstructive sleep apnea (adult) (pediatric): Secondary | ICD-10-CM | POA: Diagnosis not present

## 2013-08-14 DIAGNOSIS — M542 Cervicalgia: Secondary | ICD-10-CM | POA: Diagnosis not present

## 2013-08-29 DIAGNOSIS — D1801 Hemangioma of skin and subcutaneous tissue: Secondary | ICD-10-CM | POA: Diagnosis not present

## 2013-08-29 DIAGNOSIS — D239 Other benign neoplasm of skin, unspecified: Secondary | ICD-10-CM | POA: Diagnosis not present

## 2013-08-29 DIAGNOSIS — Z85828 Personal history of other malignant neoplasm of skin: Secondary | ICD-10-CM | POA: Diagnosis not present

## 2013-08-29 DIAGNOSIS — L719 Rosacea, unspecified: Secondary | ICD-10-CM | POA: Diagnosis not present

## 2013-08-29 DIAGNOSIS — L821 Other seborrheic keratosis: Secondary | ICD-10-CM | POA: Diagnosis not present

## 2013-09-09 DIAGNOSIS — R930 Abnormal findings on diagnostic imaging of skull and head, not elsewhere classified: Secondary | ICD-10-CM | POA: Diagnosis not present

## 2013-09-09 DIAGNOSIS — M542 Cervicalgia: Secondary | ICD-10-CM | POA: Diagnosis not present

## 2013-09-09 DIAGNOSIS — G9389 Other specified disorders of brain: Secondary | ICD-10-CM | POA: Diagnosis not present

## 2013-09-09 DIAGNOSIS — G609 Hereditary and idiopathic neuropathy, unspecified: Secondary | ICD-10-CM | POA: Diagnosis not present

## 2013-09-09 DIAGNOSIS — J329 Chronic sinusitis, unspecified: Secondary | ICD-10-CM | POA: Diagnosis not present

## 2013-09-09 DIAGNOSIS — H7090 Unspecified mastoiditis, unspecified ear: Secondary | ICD-10-CM | POA: Diagnosis not present

## 2013-09-09 DIAGNOSIS — R413 Other amnesia: Secondary | ICD-10-CM | POA: Diagnosis not present

## 2013-09-09 DIAGNOSIS — G319 Degenerative disease of nervous system, unspecified: Secondary | ICD-10-CM | POA: Diagnosis not present

## 2013-09-23 DIAGNOSIS — R413 Other amnesia: Secondary | ICD-10-CM | POA: Diagnosis not present

## 2013-09-23 DIAGNOSIS — G3184 Mild cognitive impairment, so stated: Secondary | ICD-10-CM | POA: Diagnosis not present

## 2013-10-10 ENCOUNTER — Telehealth: Payer: Self-pay | Admitting: Internal Medicine

## 2013-10-10 MED ORDER — OSELTAMIVIR PHOSPHATE 75 MG PO CAPS: 75.0000 mg | ORAL_CAPSULE | Freq: Two times a day (BID) | ORAL | Status: DC

## 2013-10-10 NOTE — Telephone Encounter (Signed)
Called and informed pt that Rx of Tamiflu sent to pharmacy.

## 2013-10-10 NOTE — Telephone Encounter (Signed)
Patient Information:  Caller Name: [name]  Phone: [phone]  Patient: [name]  Gender: Male  DOB: June 05, 1941  Age: 73 Years  PCP: Phoebe Sharps (Adults only)  Office Follow Up:  Does the office need to follow up with this patient?: Yes  Instructions For The Office: Pls see RN note  RN Note:  Sore Throat, Fever, Headache, Bodyaches, onset 10-09-13.   Pt exposed to positive Flu from Wife diagnosed on 12-31.  All emergent sxs ruled out per Influenza protocol.  Per Wife, advised by Dr Jenny Reichmann at [place] to start Spouse on Tamiflu if he developed sxs.  Wife would like Tamiflu called into [facility] on [facility].  Symptoms  Reason For Call & Symptoms: Sore Throat, Fever, Headache, Bodyaches, onset 10-09-13.   Pt exposed to positive Flu from Wife.  Reviewed Health History In EMR: Yes  Reviewed Medications In EMR: Yes  Reviewed Allergies In EMR: Yes  Reviewed Surgeries / Procedures: Yes  Date of Onset of Symptoms: 10/09/2013  Treatments Tried: Tylenol Flu and Cold x4 total on 10-09-13.  Treatments Tried Worked: No  Any Fever: Yes  Fever Taken: Oral  Fever Time Of Reading: 08:23:06  Fever Last Reading: 100.0  Guideline(s) Used:  Influenza - Seasonal  Disposition Per Guideline:   Discuss with PCP and Callback by Nurse within 1 Hour  Reason For Disposition Reached:   HIGH RISK (e.g., age > 13 years, pregnant, HIV+, chronic medical condition) and flu symptoms  Advice Given:  N/A  Patient Will Follow Care Advice:  YES

## 2013-10-29 ENCOUNTER — Encounter: Payer: Self-pay | Admitting: Cardiology

## 2013-10-29 ENCOUNTER — Ambulatory Visit (INDEPENDENT_AMBULATORY_CARE_PROVIDER_SITE_OTHER): Payer: Medicare Other | Admitting: Cardiology

## 2013-10-29 VITALS — BP 128/82 | HR 69 | Ht 75.0 in | Wt 274.0 lb

## 2013-10-29 DIAGNOSIS — I10 Essential (primary) hypertension: Secondary | ICD-10-CM

## 2013-10-29 DIAGNOSIS — R55 Syncope and collapse: Secondary | ICD-10-CM | POA: Diagnosis not present

## 2013-10-29 DIAGNOSIS — R42 Dizziness and giddiness: Secondary | ICD-10-CM

## 2013-10-29 NOTE — Patient Instructions (Signed)
Return soon for fasting labs (cbc/lp/cmet)  Your physician has requested that you have a carotid duplex. This test is an ultrasound of the carotid arteries in your neck. It looks at blood flow through these arteries that supply the brain with blood. Allow one hour for this exam. There are no restrictions or special instructions.  Your physician wants you to follow-up in: 1 year  You will receive a reminder letter in the mail two months in advance. If you don't receive a letter, please call our office to schedule the follow-up appointment.

## 2013-10-29 NOTE — Progress Notes (Signed)
Patient ID: WOLFGANG FINIGAN, male   DOB: June 04, 1941, 73 y.o.   MRN: [id number]     Patient Name: [name] Date of Encounter: 10/29/2013  Primary Care Provider:  Chancy Hurter, MD Primary Cardiologist:  Ena Dawley, H (former patient of Dr Verl Blalock)  Problem List   Past Medical History  Diagnosis Date  . Obstructive sleep apnea   . Gout   . Hypertension   . Benign prostatic hypertrophy   . Rosacea, acne   . Elevated PSA   . Nephrolithiasis     hx of  . Erectile dysfunction   . Partial Achilles tendon tear   . Diabetes mellitus type II     no meds   Past Surgical History  Procedure Laterality Date  . Carpal tunnel release  2004  . Inguinal hernia repair    . Transurethral resection of prostate    . Tonsillectomy and adenoidectomy    . Hand surgery  2007    right hand  . Knee surgery  1961    left knee  . Joint replacement      left total knee  . Tonsillectomy    . Eye surgery      cataracts  . Carpal tunnel release  10/31/2011    Procedure: CARPAL TUNNEL RELEASE;  Surgeon: Cammie Sickle., MD;  Location: [facility];  Service: Orthopedics;  Laterality: Left;   Allergies  Allergies  Allergen Reactions  . Ciprofloxacin     REACTION: Diarrhea,l rectal irritation    HPI  [name] is a former patient of Dr Verl Blalock with h/o Hypertension, Lipids, NIDDM and Obesity, no prior CAD who has followed him for hypertension and an episode of syncope a year ago.  His stress test was negative then, and all other work up as well, including head MRI, blood work. In the last year he has experienced occasional dizziness at rest, not associated with any other symptoms, no more syncopal episodes. He is a retired Psychologist, educational and experiences some memory loss. He is walking at least a mile a day and denies CP or SOB.    Home Medications  Prior to Admission medications   Medication Sig Start Date End Date Taking? Authorizing Provider  allopurinol  (ZYLOPRIM) 100 MG tablet Take 100 mg by mouth daily. 06/06/11  Yes Lisabeth Pick, MD  aspirin 81 MG tablet Take 81 mg by mouth daily.   Yes Historical Provider, MD  Bisacodyl (DULCOLAX PO) Take by mouth as needed.   Yes Historical Provider, MD  Diltiazem HCl ER 360 MG CP24 Take 1 tablet by mouth daily. 10/16/13  Yes Historical Provider, MD  finasteride (PROSCAR) 5 MG tablet Take 5 mg by mouth daily.     Yes Historical Provider, MD  lisinopril (PRINIVIL,ZESTRIL) 10 MG tablet TAKE ONE TABLET EACH DAY 08/11/13  Yes Dorothy Spark, MD  [facility] VERIO test strip  11/17/11   Historical Provider, MD    Family History  Family History  Problem Relation Age of Onset  . Thalassemia Son   . Colon cancer Neg Hx   . Stomach cancer Neg Hx     Social History  History   Social History  . Marital Status: Married    Spouse Name: N/A    Number of Children: N/A  . Years of Education: N/A   Occupational History  . Not on file.   Social History Main Topics  . Smoking status: Never Smoker   . Smokeless tobacco:  Never Used  . Alcohol Use: Yes     Comment: rarely  . Drug Use: No  . Sexual Activity: Not on file   Other Topics Concern  . Not on file   Social History Narrative   Regular exercise.    Avoid Ibuprofen, naproxen, NSAIDS.   Designated Party Release signed on 03/03/2010.     Review of Systems, as per HPI, otherwise negative General:  No chills, fever, night sweats or weight changes.  Cardiovascular:  No chest pain, dyspnea on exertion, edema, orthopnea, palpitations, paroxysmal nocturnal dyspnea. Dermatological: No rash, lesions/masses Respiratory: No cough, dyspnea Urologic: No hematuria, dysuria Abdominal:   No nausea, vomiting, diarrhea, bright red blood per rectum, melena, or hematemesis Neurologic:  No visual changes, wkns, changes in mental status. All other systems reviewed and are otherwise negative except as noted above.  Physical Exam  BP 128/82, HR 69 BPM General:  Pleasant, NAD Psych: Normal affect. Neuro: Alert and oriented X 3. Moves all extremities spontaneously. HEENT: Normal  Neck: Supple without bruits or JVD. Lungs:  Resp regular and unlabored, CTA. Heart: RRR no s3, s4, or murmurs. Abdomen: Soft, non-tender, non-distended, BS + x 4.  Extremities: No clubbing, cyanosis or edema. DP/PT/Radials 2+ and equal bilaterally.  Labs:  No results found for this basename: CKTOTAL, CKMB, TROPONINI,  in the last 72 hours Lab Results  Component Value Date   WBC 8.4 07/10/2013   HGB 14.3 07/10/2013   HCT 43.1 10/01/2012   MCV 68.7 Repeated and verified X2.* 10/01/2012   PLT 187.0 10/01/2012   No results found for this basename: NA, K, CL, CO2, BUN, CREATININE, CALCIUM, LABALBU, PROT, BILITOT, ALKPHOS, ALT, AST, GLUCOSE,  in the last 168 hours Lab Results  Component Value Date   CHOL 129 01/23/2013   HDL 26.20* 01/23/2013   LDLCALC 86 01/23/2013   TRIG 84.0 01/23/2013    Accessory Clinical Findings  Exercise treadmill stress test: 10/31/2012 Quantitative Gated Spect Images  QGS EDV: 150 ml  QGS ESV: 58 ml  Impression  Exercise Capacity: Fair exercise capacity.  BP Response: Hypertensive blood pressure response.  Clinical Symptoms: There is dyspnea.  ECG Impression: No significant ST segment change suggestive of ischemia.  Comparison with Prior Nuclear Study: No previous nuclear study performed  Overall Impression: Normal stress nuclear study with a small, mild, fixed apical defect consistent with thinning; no ischemia.  LV Ejection Fraction: 62%. LV Wall Motion: NL LV Function; NL Wall Motion   ECG - SR, 1.AVB, LAFB    Assessment & Plan  A 73 year old male   1. Dizziness, syncope, - negative extensive work up, but very rare, the last episode in October 2014. We will order a B/L Duplex carotids.   2. Hypertension - controlled  3. Hyperlipidemia - not checked in a while - we will order lipids, CMP and CBC as well   Follow up in 1  year  Dorothy Spark, MD, [facility] 10/29/2013, 9:26 AM

## 2013-11-05 ENCOUNTER — Ambulatory Visit (INDEPENDENT_AMBULATORY_CARE_PROVIDER_SITE_OTHER): Payer: Medicare Other | Admitting: Ophthalmology

## 2013-11-11 ENCOUNTER — Other Ambulatory Visit (INDEPENDENT_AMBULATORY_CARE_PROVIDER_SITE_OTHER): Payer: Medicare Other

## 2013-11-11 ENCOUNTER — Ambulatory Visit (HOSPITAL_COMMUNITY): Payer: Medicare Other | Attending: Cardiology

## 2013-11-11 ENCOUNTER — Encounter: Payer: Self-pay | Admitting: Cardiology

## 2013-11-11 DIAGNOSIS — I6529 Occlusion and stenosis of unspecified carotid artery: Secondary | ICD-10-CM | POA: Insufficient documentation

## 2013-11-11 DIAGNOSIS — R42 Dizziness and giddiness: Secondary | ICD-10-CM

## 2013-11-11 DIAGNOSIS — E119 Type 2 diabetes mellitus without complications: Secondary | ICD-10-CM | POA: Diagnosis not present

## 2013-11-11 DIAGNOSIS — I10 Essential (primary) hypertension: Secondary | ICD-10-CM

## 2013-11-11 DIAGNOSIS — R55 Syncope and collapse: Secondary | ICD-10-CM | POA: Diagnosis not present

## 2013-11-11 DIAGNOSIS — I658 Occlusion and stenosis of other precerebral arteries: Secondary | ICD-10-CM | POA: Diagnosis not present

## 2013-11-11 LAB — COMPREHENSIVE METABOLIC PANEL
ALT: 21 U/L (ref 0–53)
AST: 21 U/L (ref 0–37)
Albumin: 3.9 g/dL (ref 3.5–5.2)
Alkaline Phosphatase: 63 U/L (ref 39–117)
BUN: 23 mg/dL (ref 6–23)
CO2: 25 mEq/L (ref 19–32)
Calcium: 9.1 mg/dL (ref 8.4–10.5)
Chloride: 106 mEq/L (ref 96–112)
Creatinine, Ser: 1.5 mg/dL (ref 0.4–1.5)
GFR: 48.13 mL/min — ABNORMAL LOW (ref >60.00)
Glucose, Bld: 138 mg/dL — ABNORMAL HIGH (ref 70–99)
Potassium: 3.9 mEq/L (ref 3.5–5.1)
Sodium: 138 mEq/L (ref 135–145)
Total Bilirubin: 1.2 mg/dL (ref 0.3–1.2)
Total Protein: 6.9 g/dL (ref 6.0–8.3)

## 2013-11-11 LAB — CBC WITH DIFFERENTIAL/PLATELET
Basophils Absolute: 0 10*3/uL (ref 0.0–0.1)
Basophils Relative: 0.5 % (ref 0.0–3.0)
Eosinophils Absolute: 0.2 10*3/uL (ref 0.0–0.7)
Eosinophils Relative: 3 % (ref 0.0–5.0)
HCT: 42.6 % (ref 39.0–52.0)
Hemoglobin: 13.2 g/dL (ref 13.0–17.0)
Lymphocytes Relative: 25.9 % (ref 12.0–46.0)
Lymphs Abs: 1.8 10*3/uL (ref 0.7–4.0)
MCHC: 31.1 g/dL (ref 30.0–36.0)
MCV: 69.8 fl — ABNORMAL LOW (ref 78.0–100.0)
Monocytes Absolute: 1.1 10*3/uL — ABNORMAL HIGH (ref 0.1–1.0)
Monocytes Relative: 15.8 % — ABNORMAL HIGH (ref 3.0–12.0)
Neutro Abs: 3.8 10*3/uL (ref 1.4–7.7)
Neutrophils Relative %: 54.8 % (ref 43.0–77.0)
Platelets: 195 10*3/uL (ref 150.0–400.0)
RBC: 6.1 Mil/uL — ABNORMAL HIGH (ref 4.22–5.81)
RDW: 17.2 % — ABNORMAL HIGH (ref 11.5–14.6)
WBC: 6.9 10*3/uL (ref 4.5–10.5)

## 2013-11-11 LAB — LIPID PANEL
Cholesterol: 151 mg/dL (ref 0–200)
HDL: 33 mg/dL — ABNORMAL LOW (ref >39.00)
LDL Cholesterol: 96 mg/dL (ref 0–99)
Total CHOL/HDL Ratio: 5
Triglycerides: 112 mg/dL (ref 0.0–149.0)
VLDL: 22.4 mg/dL (ref 0.0–40.0)

## 2013-11-13 DIAGNOSIS — G4733 Obstructive sleep apnea (adult) (pediatric): Secondary | ICD-10-CM | POA: Insufficient documentation

## 2013-11-17 ENCOUNTER — Ambulatory Visit (INDEPENDENT_AMBULATORY_CARE_PROVIDER_SITE_OTHER): Payer: Medicare Other | Admitting: Ophthalmology

## 2013-11-17 DIAGNOSIS — E11319 Type 2 diabetes mellitus with unspecified diabetic retinopathy without macular edema: Secondary | ICD-10-CM

## 2013-11-17 DIAGNOSIS — H35039 Hypertensive retinopathy, unspecified eye: Secondary | ICD-10-CM | POA: Diagnosis not present

## 2013-11-17 DIAGNOSIS — I10 Essential (primary) hypertension: Secondary | ICD-10-CM | POA: Diagnosis not present

## 2013-11-17 DIAGNOSIS — H43819 Vitreous degeneration, unspecified eye: Secondary | ICD-10-CM

## 2013-11-17 DIAGNOSIS — E1139 Type 2 diabetes mellitus with other diabetic ophthalmic complication: Secondary | ICD-10-CM | POA: Diagnosis not present

## 2013-11-17 DIAGNOSIS — E1165 Type 2 diabetes mellitus with hyperglycemia: Secondary | ICD-10-CM

## 2013-11-17 DIAGNOSIS — D3130 Benign neoplasm of unspecified choroid: Secondary | ICD-10-CM

## 2013-11-17 DIAGNOSIS — H35379 Puckering of macula, unspecified eye: Secondary | ICD-10-CM

## 2013-12-01 DIAGNOSIS — N400 Enlarged prostate without lower urinary tract symptoms: Secondary | ICD-10-CM | POA: Diagnosis not present

## 2013-12-01 DIAGNOSIS — N200 Calculus of kidney: Secondary | ICD-10-CM | POA: Diagnosis not present

## 2013-12-01 DIAGNOSIS — N183 Chronic kidney disease, stage 3 unspecified: Secondary | ICD-10-CM | POA: Diagnosis not present

## 2013-12-01 DIAGNOSIS — IMO0002 Reserved for concepts with insufficient information to code with codable children: Secondary | ICD-10-CM | POA: Diagnosis not present

## 2013-12-01 DIAGNOSIS — N320 Bladder-neck obstruction: Secondary | ICD-10-CM | POA: Diagnosis not present

## 2013-12-10 ENCOUNTER — Encounter: Payer: Self-pay | Admitting: Podiatry

## 2013-12-10 ENCOUNTER — Ambulatory Visit (INDEPENDENT_AMBULATORY_CARE_PROVIDER_SITE_OTHER): Payer: Medicare Other | Admitting: Podiatry

## 2013-12-10 VITALS — BP 136/80 | HR 60 | Resp 12

## 2013-12-10 DIAGNOSIS — B351 Tinea unguium: Secondary | ICD-10-CM

## 2013-12-10 NOTE — Progress Notes (Signed)
   Subjective:    Patient ID: [name], male    DOB: 03/21/1941, 73 y.o.   MRN: [id number]  HPI '' LT FOOT GREAT TOENAIL IS THICK BUT NOT PAINFUL FOR A LONG TERM. TRIED TO KEEP IT TRIM AND TRIED NO TREATMENT.''    Review of Systems  Eyes: Positive for redness.  All other systems reviewed and are negative.       Objective:   Physical Exam        Assessment & Plan:

## 2013-12-11 NOTE — Progress Notes (Signed)
Subjective:     Patient ID: [name], male   DOB: 27-Jan-1941, 73 y.o.   MRN: [id number]  HPI patient presents with a very thickened big toenail left am not sure what can be done. States it bothers him at times but not all the time   Review of Systems  All other systems reviewed and are negative.       Objective:   Physical Exam  Nursing note and vitals reviewed. Constitutional: He is oriented to person, place, and time.  Cardiovascular: Intact distal pulses.   Musculoskeletal: Normal range of motion.  Neurological: He is oriented to person, place, and time.  Skin: Skin is dry.   neurovascular status is intact with range of motion adequate and no equinus condition noted. I noted the hallux nail left is quite thickened and dystrophic and mildly tender when pressed with no ingrown toenail noted     Assessment:     Damage hallux nail left of years duration with damaged growth plate    Plan:     H&P done and discussed this nailbed. I did discuss removal but cannot recommended the current time and today I debrided the nail bed and smoothed which can be done periodically

## 2013-12-30 DIAGNOSIS — G4733 Obstructive sleep apnea (adult) (pediatric): Secondary | ICD-10-CM | POA: Diagnosis not present

## 2014-01-06 DIAGNOSIS — N400 Enlarged prostate without lower urinary tract symptoms: Secondary | ICD-10-CM | POA: Diagnosis not present

## 2014-01-06 DIAGNOSIS — M109 Gout, unspecified: Secondary | ICD-10-CM | POA: Diagnosis not present

## 2014-01-06 DIAGNOSIS — G4733 Obstructive sleep apnea (adult) (pediatric): Secondary | ICD-10-CM | POA: Diagnosis not present

## 2014-01-06 DIAGNOSIS — R7309 Other abnormal glucose: Secondary | ICD-10-CM | POA: Diagnosis not present

## 2014-01-06 DIAGNOSIS — K5900 Constipation, unspecified: Secondary | ICD-10-CM | POA: Diagnosis not present

## 2014-01-06 DIAGNOSIS — Z6837 Body mass index (BMI) 37.0-37.9, adult: Secondary | ICD-10-CM | POA: Diagnosis not present

## 2014-01-06 DIAGNOSIS — R413 Other amnesia: Secondary | ICD-10-CM | POA: Diagnosis not present

## 2014-01-06 DIAGNOSIS — I10 Essential (primary) hypertension: Secondary | ICD-10-CM | POA: Diagnosis not present

## 2014-01-08 DIAGNOSIS — R7309 Other abnormal glucose: Secondary | ICD-10-CM | POA: Diagnosis not present

## 2014-01-08 DIAGNOSIS — Z23 Encounter for immunization: Secondary | ICD-10-CM | POA: Diagnosis not present

## 2014-02-11 ENCOUNTER — Telehealth: Payer: Self-pay

## 2014-02-11 NOTE — Telephone Encounter (Signed)
Pt scheduled to see Tye Savoy NP Friday at 1:30pm, wife aware of appt.

## 2014-02-11 NOTE — Telephone Encounter (Signed)
Message copied by Algernon Huxley on Wed Feb 11, 2014 11:38 AM ------      Message from: Irene Shipper      Created: Wed Feb 11, 2014 11:12 AM      Regarding: Office appointment       Vaughan Basta,      The patient's wife, [name], called me (message on my office phone) regarding his constipation. She wants him to be seen. They are close acquaintances of mine. Call back his wife and let her know that I have no openings, but would like him to see an extender this Friday when I am in the office. I will supervise that encounter and talk with them while they are in the office. Please give extender the heads up so that I don't miss them that day. Her number is [phone]. Thanks ------

## 2014-02-13 ENCOUNTER — Ambulatory Visit (INDEPENDENT_AMBULATORY_CARE_PROVIDER_SITE_OTHER): Payer: Medicare Other | Admitting: Nurse Practitioner

## 2014-02-13 ENCOUNTER — Encounter: Payer: Self-pay | Admitting: Nurse Practitioner

## 2014-02-13 VITALS — BP 162/70 | HR 64 | Ht 73.5 in | Wt 280.5 lb

## 2014-02-13 DIAGNOSIS — K5900 Constipation, unspecified: Secondary | ICD-10-CM | POA: Diagnosis not present

## 2014-02-13 DIAGNOSIS — K5901 Slow transit constipation: Secondary | ICD-10-CM | POA: Insufficient documentation

## 2014-02-13 MED ORDER — LINACLOTIDE 145 MCG PO CAPS: 145.0000 ug | ORAL_CAPSULE | Freq: Every day | ORAL | Status: DC

## 2014-02-13 MED ORDER — ONDANSETRON HCL 4 MG PO TABS: 4.0000 mg | ORAL_TABLET | Freq: Three times a day (TID) | ORAL | Status: DC | PRN

## 2014-02-13 NOTE — Patient Instructions (Signed)
We have given you samples of the following medication to take: Linzess 145 mcg, please take one capsule by mouth once daily on an empty stomach If this works well please call back for a prescription  We have sent the following medications to your pharmacy for you to pick up at your convenience: Zofran   Please purchase Glycerin Suppository over the counter and use as needed for constipation.

## 2014-02-13 NOTE — Progress Notes (Signed)
     History of Present Illness:  Patient is a 73 year old male known to Dr. Henrene Pastor for a history of adenomatous colon polyps. He is up to date on surveillance colonoscopies, next one due May 2017.   Patient comes in today for a several month history of constipation. His wife is present and helps with the history. Patient took daily Miralax for a few months with limited benefit. Currently just taking Dulcolax 1-3 tablets a day which sometimes causes loose stool. Patient has daily urge to defecate but stools are large and difficult to pass. He is less active these days but no other lifestyle changes. Except for Aricept, no medication changes. He doesn't take narcotics. Patient endorses occasional nausea which has woken him  from sleep a few times over the last few months. No vomiting. Weight is stable.    Current Medications, Allergies, Past Medical History, Past Surgical History, Family History and Social History were reviewed in [employer] record.  Physical Exam: General: Pleasant, well developed , white male in no acute distress Head: Normocephalic and atraumatic Eyes:  sclerae anicteric, conjunctiva pink  Ears: Normal auditory acuity Lungs: Clear throughout to auscultation Heart: Regular rate and rhythm Abdomen: Soft, obese, non distended, non-tender. No masses, no hepatomegaly. Normal bowel sounds Rectal: small amount of liquid stool around anus. No external lesions. No stool in vault. No masses felt. Sphincter tone adequate Musculoskeletal: Symmetrical with no gross deformities  Extremities: No edema  Neurological: Alert oriented x 4, grossly nonfocal Psychological:  Alert and cooperative. Normal mood and affect  Assessment and Recommendations:  48. 73 year old male with progressive constipation over last several months. He is able to have a daily BMs on Miralax or Dulcolax but feels it requires considerable effort to evacuate rectal contents. Stools are large.  Samples of Linzess given. In addition, recommended prn glycerin suppositories to facilitate evacuation of rectal contents. Patient will let us know how this regimen is working for him.   2. Intermittent nausea without vomiting. Etiology is unclear but he hasn't had any unexplained weight loss. Trial of Zofran to take as needed. If problems persist he may need further workup.   3. Hx of adenomatous colon polyps. Next colonoscopy due may 2017

## 2014-02-14 NOTE — Progress Notes (Signed)
Seen with NP, and agree

## 2014-03-10 DIAGNOSIS — K5900 Constipation, unspecified: Secondary | ICD-10-CM | POA: Diagnosis not present

## 2014-03-10 DIAGNOSIS — R7309 Other abnormal glucose: Secondary | ICD-10-CM | POA: Diagnosis not present

## 2014-03-10 DIAGNOSIS — M109 Gout, unspecified: Secondary | ICD-10-CM | POA: Diagnosis not present

## 2014-03-10 DIAGNOSIS — I10 Essential (primary) hypertension: Secondary | ICD-10-CM | POA: Diagnosis not present

## 2014-03-10 DIAGNOSIS — G4733 Obstructive sleep apnea (adult) (pediatric): Secondary | ICD-10-CM | POA: Diagnosis not present

## 2014-03-10 DIAGNOSIS — Z9189 Other specified personal risk factors, not elsewhere classified: Secondary | ICD-10-CM | POA: Diagnosis not present

## 2014-03-10 DIAGNOSIS — Z6837 Body mass index (BMI) 37.0-37.9, adult: Secondary | ICD-10-CM | POA: Diagnosis not present

## 2014-03-10 DIAGNOSIS — Z0000 Encounter for general adult medical examination without abnormal findings: Secondary | ICD-10-CM | POA: Diagnosis not present

## 2014-03-11 DIAGNOSIS — R7309 Other abnormal glucose: Secondary | ICD-10-CM | POA: Diagnosis not present

## 2014-03-16 DIAGNOSIS — Z1212 Encounter for screening for malignant neoplasm of rectum: Secondary | ICD-10-CM | POA: Diagnosis not present

## 2014-03-25 DIAGNOSIS — G4710 Hypersomnia, unspecified: Secondary | ICD-10-CM | POA: Diagnosis not present

## 2014-03-25 DIAGNOSIS — I10 Essential (primary) hypertension: Secondary | ICD-10-CM | POA: Diagnosis not present

## 2014-03-25 DIAGNOSIS — G4733 Obstructive sleep apnea (adult) (pediatric): Secondary | ICD-10-CM | POA: Diagnosis not present

## 2014-03-31 ENCOUNTER — Other Ambulatory Visit: Payer: Self-pay | Admitting: Cardiology

## 2014-05-12 DIAGNOSIS — R413 Other amnesia: Secondary | ICD-10-CM | POA: Diagnosis not present

## 2014-06-11 DIAGNOSIS — F329 Major depressive disorder, single episode, unspecified: Secondary | ICD-10-CM | POA: Insufficient documentation

## 2014-06-11 DIAGNOSIS — R269 Unspecified abnormalities of gait and mobility: Secondary | ICD-10-CM | POA: Diagnosis not present

## 2014-06-11 DIAGNOSIS — M542 Cervicalgia: Secondary | ICD-10-CM | POA: Diagnosis not present

## 2014-06-11 DIAGNOSIS — I10 Essential (primary) hypertension: Secondary | ICD-10-CM | POA: Diagnosis not present

## 2014-06-11 DIAGNOSIS — F32A Depression, unspecified: Secondary | ICD-10-CM | POA: Insufficient documentation

## 2014-06-11 DIAGNOSIS — G3184 Mild cognitive impairment, so stated: Secondary | ICD-10-CM | POA: Diagnosis not present

## 2014-06-11 DIAGNOSIS — G4733 Obstructive sleep apnea (adult) (pediatric): Secondary | ICD-10-CM | POA: Diagnosis not present

## 2014-06-11 DIAGNOSIS — R259 Unspecified abnormal involuntary movements: Secondary | ICD-10-CM | POA: Diagnosis not present

## 2014-06-11 DIAGNOSIS — G4710 Hypersomnia, unspecified: Secondary | ICD-10-CM | POA: Diagnosis not present

## 2014-06-11 DIAGNOSIS — Z79899 Other long term (current) drug therapy: Secondary | ICD-10-CM | POA: Diagnosis not present

## 2014-06-22 DIAGNOSIS — R269 Unspecified abnormalities of gait and mobility: Secondary | ICD-10-CM | POA: Diagnosis not present

## 2014-06-24 DIAGNOSIS — Z6837 Body mass index (BMI) 37.0-37.9, adult: Secondary | ICD-10-CM | POA: Diagnosis not present

## 2014-06-24 DIAGNOSIS — R110 Nausea: Secondary | ICD-10-CM | POA: Diagnosis not present

## 2014-06-30 ENCOUNTER — Telehealth: Payer: Self-pay | Admitting: Internal Medicine

## 2014-06-30 DIAGNOSIS — Z96659 Presence of unspecified artificial knee joint: Secondary | ICD-10-CM | POA: Diagnosis not present

## 2014-06-30 NOTE — Telephone Encounter (Signed)
Amy can see him. Pick a day when I will be in the office and can supervise that visit if needed.

## 2014-06-30 NOTE — Telephone Encounter (Signed)
Left message for pt to call back  °

## 2014-06-30 NOTE — Telephone Encounter (Signed)
Pt scheduled to see Amy Esterwood PA 07/10/14@1 :30pm. Pt aware of appt.

## 2014-06-30 NOTE — Telephone Encounter (Signed)
Pts wife states the pt has had nausea for about a month. States they thought it was related to a medication, he stopped this but the nausea continued. Pt saw his PCP and was placed on Prilosec but states the reflux is bad at night and he is having to take Zantac also. Requesting pt be seen. Offered pt an appt with Nicoletta Ba PA next week, wife states that sometimes Dr. Henrene Pastor works him in. Please advise.

## 2014-07-01 DIAGNOSIS — R269 Unspecified abnormalities of gait and mobility: Secondary | ICD-10-CM | POA: Diagnosis not present

## 2014-07-02 DIAGNOSIS — M25649 Stiffness of unspecified hand, not elsewhere classified: Secondary | ICD-10-CM | POA: Diagnosis not present

## 2014-07-03 DIAGNOSIS — R269 Unspecified abnormalities of gait and mobility: Secondary | ICD-10-CM | POA: Diagnosis not present

## 2014-07-06 DIAGNOSIS — R269 Unspecified abnormalities of gait and mobility: Secondary | ICD-10-CM | POA: Diagnosis not present

## 2014-07-07 DIAGNOSIS — M25649 Stiffness of unspecified hand, not elsewhere classified: Secondary | ICD-10-CM | POA: Diagnosis not present

## 2014-07-09 DIAGNOSIS — R269 Unspecified abnormalities of gait and mobility: Secondary | ICD-10-CM | POA: Diagnosis not present

## 2014-07-10 ENCOUNTER — Ambulatory Visit (INDEPENDENT_AMBULATORY_CARE_PROVIDER_SITE_OTHER): Payer: Medicare Other | Admitting: Physician Assistant

## 2014-07-10 ENCOUNTER — Encounter: Payer: Self-pay | Admitting: Physician Assistant

## 2014-07-10 ENCOUNTER — Other Ambulatory Visit (INDEPENDENT_AMBULATORY_CARE_PROVIDER_SITE_OTHER): Payer: Medicare Other

## 2014-07-10 VITALS — BP 158/76 | HR 60 | Ht 73.0 in | Wt 279.6 lb

## 2014-07-10 DIAGNOSIS — R110 Nausea: Secondary | ICD-10-CM

## 2014-07-10 DIAGNOSIS — M25641 Stiffness of right hand, not elsewhere classified: Secondary | ICD-10-CM | POA: Diagnosis not present

## 2014-07-10 DIAGNOSIS — R1013 Epigastric pain: Secondary | ICD-10-CM

## 2014-07-10 LAB — COMPREHENSIVE METABOLIC PANEL
ALBUMIN: 4.2 g/dL (ref 3.5–5.2)
ALK PHOS: 66 U/L (ref 39–117)
ALT: 33 U/L (ref 0–53)
AST: 39 U/L — AB (ref 0–37)
BUN: 19 mg/dL (ref 6–23)
CO2: 25 mEq/L (ref 19–32)
Calcium: 9.2 mg/dL (ref 8.4–10.5)
Chloride: 105 mEq/L (ref 96–112)
Creatinine, Ser: 1.4 mg/dL (ref 0.4–1.5)
GFR: 50.75 mL/min — ABNORMAL LOW (ref >60.00)
Glucose, Bld: 115 mg/dL — ABNORMAL HIGH (ref 70–99)
POTASSIUM: 4.3 meq/L (ref 3.5–5.1)
Sodium: 138 mEq/L (ref 135–145)
Total Bilirubin: 0.9 mg/dL (ref 0.2–1.2)
Total Protein: 7.2 g/dL (ref 6.0–8.3)

## 2014-07-10 NOTE — Progress Notes (Signed)
Subjective:    Patient ID: [name], male    DOB: 08/24/1941, 73 y.o.   MRN: [id number]  HPI [name] is a pleasant 73 year old white male known to Dr. Henrene Pastor who comes in today for evaluation of nausea and abdominal discomfort. Patient has been having symptoms over the past couple of months waking up frequently in the middle of the night very nauseated and feeling as if he needs to vomit. He says she generally does not have any emesis. He has no significant abdominal pain with these episodes but does feel uncomfortable in his upper abdomen. He has been seen by his PCP had recent labs done on 06/24/2014 with normal liver function tests CBC unremarkable with the exception of an MCV low at 66.8. He was started on Prilosec OTC with dinner and after a couple of weeks really did not notice much difference. He is now taking 220 mg Prilosec 6 with dinner and feels that that has helped a bit over the past 10 days. Only new medication had been citalopram which she had only taken briefly and was stopped and his wife says there was no difference in his symptoms off of this medicine. His appetite has been fair he says he just doesn't have an appetite like he used to though he has not lost any weight. Bowel movements have been regular. He is not taking any regular NSAIDs Other medical problems include adenomatous colon polyps last colonoscopy May 2013 due for followup in 2018, diverticulosis, adult onset diabetes mellitus, gout and hypertension as well as sleep apnea He also has dementia for which she is on Aricept. Patient's wife is concerned because she says he just doesn't act like he feels well.   Review of Systems  Constitutional: Positive for appetite change.  HENT: Negative.   Eyes: Negative.   Respiratory: Negative.   Cardiovascular: Negative.   Gastrointestinal: Positive for nausea.  Endocrine: Negative.   Genitourinary: Negative.   Musculoskeletal: Negative.   Skin: Negative.     Allergic/Immunologic: Negative.   Neurological: Negative.   Hematological: Negative.   Psychiatric/Behavioral: Negative.    Outpatient Prescriptions Prior to Visit  Medication Sig Dispense Refill  . allopurinol (ZYLOPRIM) 100 MG tablet Take 100 mg by mouth daily.      Marland Kitchen aspirin 81 MG tablet Take 81 mg by mouth daily.      . Diltiazem HCl ER 360 MG CP24 Take 1 tablet by mouth daily.      Marland Kitchen donepezil (ARICEPT) 5 MG tablet       . finasteride (PROSCAR) 5 MG tablet Take 5 mg by mouth daily.        . Linaclotide (LINZESS) 145 MCG CAPS capsule Take 1 capsule (145 mcg total) by mouth daily.  18 capsule  0  . lisinopril (PRINIVIL,ZESTRIL) 10 MG tablet TAKE ONE TABLET EVERY DAY  30 tablet  3  . metroNIDAZOLE (METROGEL) 1 % gel       . ondansetron (ZOFRAN) 4 MG tablet Take 1 tablet (4 mg total) by mouth every 8 (eight) hours as needed for nausea or vomiting.  30 tablet  1  . ONETOUCH VERIO test strip       . Bisacodyl (DULCOLAX PO) Take by mouth as needed.       No facility-administered medications prior to visit.   Allergies  Allergen Reactions  . Ciprofloxacin     REACTION: Diarrhea,l rectal irritation   Patient Active Problem List   Diagnosis Date Noted  . Unspecified constipation  02/13/2014  . Syncope 10/24/2012  . Renal insufficiency 11/06/2011  . OSTEOARTHRITIS, KNEE, LEFT 03/03/2010  . OBSTRUCTIVE SLEEP APNEA 06/04/2007  . PSA, INCREASED 06/04/2007  . DIABETES MELLITUS, TYPE II 01/10/2007  . GOUT 01/10/2007  . HYPERTENSION 01/10/2007  . BENIGN PROSTATIC HYPERTROPHY 01/10/2007  . NEPHROLITHIASIS, HX OF 01/10/2007   History  Substance Use Topics  . Smoking status: Never Smoker   . Smokeless tobacco: Never Used  . Alcohol Use: Yes     Comment: rarely   family history includes Thalassemia in his son. There is no history of Colon cancer or Stomach cancer.     Objective:   Physical Exam  well-developed elderly white male in no acute distress accompanied by his wife who  gives much of the history blood pressure 158 her 75 pulse 60 height 6 foot 1 weight 279. HEENT; nontraumatic normocephalic EOMI PERRLA sclera anicteric, Cardiovascular; regular rate and rhythm with S1-S2 no murmur or gallop, Pulmonary clear bilaterally, Abdomen; large, soft ,no focal tenderness ,no guarding or rebound no palpable mass or hepatosplenomegaly bowel sounds are present, Rectal ;exam not done, Extremities; no clubbing cyanosis or edema skin warm and dry, Psych ;mood and affect appropriate        Assessment & Plan:  #44  73 year old male with 2-3 month history of frequent nocturnal nausea waking him from sleep and vague upper abdominal discomfort. Thus far no significant response to PPI therapy. Etiology is not clear, will rule out gastropathy, peptic ulcer disease, underlying malignancy or a manifestation of GERD.  #2 history of adenomatous colon polyps due for followup 2018 #3 dementia #4.onset diabetes mellitus #5 gout #6 sleep apnea  Plan; continue 40 mg of Prilosec a.c. dinner daily and change prescription to 40 mg Prilosec Schedule for CT scan of the abdomen and pelvis Schedule for upper endoscopy with Dr. Henrene Pastor. Procedure discussed in detail the patient and his wife and they're agreeable to proceed . If above workup is negative-and symptoms are persisting may need to adjust some of his meds as potential culprits i.e. Aricept and/or Linzess.

## 2014-07-10 NOTE — Patient Instructions (Addendum)
Please go to the basement level to have your labs drawn.  You have been scheduled for an endoscopy. Please follow written instructions given to you at your visit today. If you use inhalers (even only as needed), please bring them with you on the day of your procedure. Your physician has requested that you go to www.startemmi.com and enter the access code given to you at your visit today. This web site gives a general overview about your procedure. However, you should still follow specific instructions given to you by our office regarding your preparation for the procedure.   You have been scheduled for a CT scan of the abdomen and pelvis at [facility] ([address]---this is in the same building as Press [facility]).   You are scheduled on 07-14-2014 at 3:30 PM . You should arrive at 3:15 PM  prior to your appointment time for registration. Please follow the written instructions below on the day of your exam:  WARNING: IF YOU ARE ALLERGIC TO IODINE/X-RAY DYE, PLEASE NOTIFY RADIOLOGY IMMEDIATELY AT [phone]! YOU WILL BE GIVEN A 13 HOUR PREMEDICATION PREP.  1) Do not eat or drink anything after 11:30 am (4 hours prior to your test) 2) You have been given 2 bottles of oral contrast to drink. The solution may taste better if refrigerated, but do NOT add ice or any other liquid to this solution. Shake well before drinking.    Drink 1 bottle of contrast @ 9:30 am  (2 hours prior to your exam)  Drink 1 bottle of contrast @ 10:30 am  (1 hour prior to your exam)  You may take any medications as prescribed with a small amount of water except for the following: Metformin, Glucophage, Glucovance, Avandamet, Riomet, Fortamet, Actoplus Met, Janumet, Glumetza or Metaglip. The above medications must be held the day of the exam AND 48 hours after the exam.  The purpose of you drinking the oral contrast is to aid in the visualization of your intestinal tract. The contrast solution may cause  some diarrhea. Before your exam is started, you will be given a small amount of fluid to drink. Depending on your individual set of symptoms, you may also receive an intravenous injection of x-ray contrast/dye. Plan on being at [facility] for 30 minutes or long, depending on the type of exam you are having performed.  If you have any questions regarding your exam or if you need to reschedule, you may call the CT department at [phone] between the hours of 8:00 am and 5:00 pm, Monday-Friday.  ________________________________________________________________________

## 2014-07-13 ENCOUNTER — Telehealth: Payer: Self-pay | Admitting: Physician Assistant

## 2014-07-13 DIAGNOSIS — R269 Unspecified abnormalities of gait and mobility: Secondary | ICD-10-CM | POA: Diagnosis not present

## 2014-07-13 MED ORDER — OMEPRAZOLE 40 MG PO CPDR: 40.0000 mg | DELAYED_RELEASE_CAPSULE | Freq: Every day | ORAL | Status: DC

## 2014-07-13 NOTE — Telephone Encounter (Signed)
Sent prescription to [facility] for Omeprazole 40 mg, # 30 with 6 RF.

## 2014-07-13 NOTE — Progress Notes (Signed)
Patient seen and examined along with physician assistant. Discussed care plan with patient and wife

## 2014-07-14 ENCOUNTER — Ambulatory Visit (INDEPENDENT_AMBULATORY_CARE_PROVIDER_SITE_OTHER)
Admission: RE | Admit: 2014-07-14 | Discharge: 2014-07-14 | Disposition: A | Discharge status: Home / Self Care | Payer: Medicare Other | Source: Ambulatory Visit | Attending: Physician Assistant | Admitting: Physician Assistant

## 2014-07-14 DIAGNOSIS — R1013 Epigastric pain: Secondary | ICD-10-CM | POA: Diagnosis not present

## 2014-07-14 DIAGNOSIS — R110 Nausea: Secondary | ICD-10-CM | POA: Diagnosis not present

## 2014-07-14 DIAGNOSIS — K8020 Calculus of gallbladder without cholecystitis without obstruction: Secondary | ICD-10-CM | POA: Diagnosis not present

## 2014-07-14 DIAGNOSIS — N200 Calculus of kidney: Secondary | ICD-10-CM | POA: Diagnosis not present

## 2014-07-14 MED ORDER — IOHEXOL 300 MG/ML  SOLN
100.0000 mL | Freq: Once | INTRAMUSCULAR | Status: AC | PRN
  Administered 2014-07-14: 100 mL via INTRAVENOUS

## 2014-07-16 DIAGNOSIS — R269 Unspecified abnormalities of gait and mobility: Secondary | ICD-10-CM | POA: Diagnosis not present

## 2014-07-17 DIAGNOSIS — M25641 Stiffness of right hand, not elsewhere classified: Secondary | ICD-10-CM | POA: Diagnosis not present

## 2014-07-17 DIAGNOSIS — N200 Calculus of kidney: Secondary | ICD-10-CM | POA: Diagnosis not present

## 2014-07-17 DIAGNOSIS — Z23 Encounter for immunization: Secondary | ICD-10-CM | POA: Diagnosis not present

## 2014-07-17 DIAGNOSIS — I129 Hypertensive chronic kidney disease with stage 1 through stage 4 chronic kidney disease, or unspecified chronic kidney disease: Secondary | ICD-10-CM | POA: Diagnosis not present

## 2014-07-17 DIAGNOSIS — N183 Chronic kidney disease, stage 3 (moderate): Secondary | ICD-10-CM | POA: Diagnosis not present

## 2014-07-20 DIAGNOSIS — R269 Unspecified abnormalities of gait and mobility: Secondary | ICD-10-CM | POA: Diagnosis not present

## 2014-07-21 DIAGNOSIS — M25641 Stiffness of right hand, not elsewhere classified: Secondary | ICD-10-CM | POA: Diagnosis not present

## 2014-07-22 DIAGNOSIS — L270 Generalized skin eruption due to drugs and medicaments taken internally: Secondary | ICD-10-CM | POA: Diagnosis not present

## 2014-07-22 DIAGNOSIS — Z85828 Personal history of other malignant neoplasm of skin: Secondary | ICD-10-CM | POA: Diagnosis not present

## 2014-07-22 DIAGNOSIS — Z6838 Body mass index (BMI) 38.0-38.9, adult: Secondary | ICD-10-CM | POA: Diagnosis not present

## 2014-07-22 DIAGNOSIS — L509 Urticaria, unspecified: Secondary | ICD-10-CM | POA: Diagnosis not present

## 2014-07-24 DIAGNOSIS — M25641 Stiffness of right hand, not elsewhere classified: Secondary | ICD-10-CM | POA: Diagnosis not present

## 2014-07-28 DIAGNOSIS — M25641 Stiffness of right hand, not elsewhere classified: Secondary | ICD-10-CM | POA: Diagnosis not present

## 2014-07-31 DIAGNOSIS — M25641 Stiffness of right hand, not elsewhere classified: Secondary | ICD-10-CM | POA: Diagnosis not present

## 2014-08-03 DIAGNOSIS — R21 Rash and other nonspecific skin eruption: Secondary | ICD-10-CM | POA: Diagnosis not present

## 2014-08-03 DIAGNOSIS — J310 Chronic rhinitis: Secondary | ICD-10-CM | POA: Diagnosis not present

## 2014-08-07 DIAGNOSIS — M25641 Stiffness of right hand, not elsewhere classified: Secondary | ICD-10-CM | POA: Diagnosis not present

## 2014-08-13 DIAGNOSIS — R7309 Other abnormal glucose: Secondary | ICD-10-CM | POA: Diagnosis not present

## 2014-08-14 DIAGNOSIS — M25641 Stiffness of right hand, not elsewhere classified: Secondary | ICD-10-CM | POA: Diagnosis not present

## 2014-08-17 ENCOUNTER — Encounter: Payer: Medicare Other | Admitting: Internal Medicine

## 2014-08-18 DIAGNOSIS — M25641 Stiffness of right hand, not elsewhere classified: Secondary | ICD-10-CM | POA: Diagnosis not present

## 2014-08-21 DIAGNOSIS — M25641 Stiffness of right hand, not elsewhere classified: Secondary | ICD-10-CM | POA: Diagnosis not present

## 2014-08-28 DIAGNOSIS — M25641 Stiffness of right hand, not elsewhere classified: Secondary | ICD-10-CM | POA: Diagnosis not present

## 2014-08-29 ENCOUNTER — Other Ambulatory Visit: Payer: Self-pay | Admitting: Cardiology

## 2014-09-02 ENCOUNTER — Encounter: Payer: Medicare Other | Admitting: Internal Medicine

## 2014-10-12 DIAGNOSIS — N401 Enlarged prostate with lower urinary tract symptoms: Secondary | ICD-10-CM | POA: Insufficient documentation

## 2014-10-12 DIAGNOSIS — R351 Nocturia: Secondary | ICD-10-CM | POA: Diagnosis not present

## 2014-10-12 DIAGNOSIS — N200 Calculus of kidney: Secondary | ICD-10-CM | POA: Diagnosis not present

## 2014-10-12 DIAGNOSIS — N398 Other specified disorders of urinary system: Secondary | ICD-10-CM | POA: Diagnosis not present

## 2014-10-12 DIAGNOSIS — N183 Chronic kidney disease, stage 3 (moderate): Secondary | ICD-10-CM | POA: Diagnosis not present

## 2014-10-26 ENCOUNTER — Other Ambulatory Visit: Payer: Self-pay | Admitting: Cardiology

## 2014-10-27 ENCOUNTER — Encounter: Payer: Self-pay | Admitting: Cardiovascular Disease

## 2014-10-27 ENCOUNTER — Ambulatory Visit (INDEPENDENT_AMBULATORY_CARE_PROVIDER_SITE_OTHER): Payer: Medicare Other | Admitting: Cardiovascular Disease

## 2014-10-27 VITALS — BP 170/88 | HR 67 | Ht 73.0 in | Wt 279.0 lb

## 2014-10-27 DIAGNOSIS — I10 Essential (primary) hypertension: Secondary | ICD-10-CM

## 2014-10-27 MED ORDER — LISINOPRIL 10 MG PO TABS: 10.0000 mg | ORAL_TABLET | Freq: Every day | ORAL | Status: DC

## 2014-10-27 NOTE — Progress Notes (Signed)
Cardiology Office Note   Date:  10/27/2014   ID:  [name], DOB 08/13/1941, MRN [id number]  PCP:  Velna Hatchet, MD  Cardiologist:  Sherren Mocha, MD    No chief complaint on file.    History of Present Illness: [name] is a 74 y.o. male who presents for follow-up of hypertension, hyperlipidemia, and history of syncope. His most significant problem in recent years has been related to dementia. He is followed at [facility] for his cognitive impairment. He still lives independently with his wife and they plan to move to [place] in the near future.   He has not been physically active over recent years. He has no symptoms of chest pain, chest pressure, edema, orthopnea, or PND. He's limited by knee pain and gait instability.   Past Medical History  Diagnosis Date  . Obstructive sleep apnea   . Gout   . Hypertension   . Benign prostatic hypertrophy   . Rosacea, acne   . Elevated PSA   . Nephrolithiasis     hx of  . Erectile dysfunction   . Partial Achilles tendon tear   . Diabetes mellitus type II     no meds  . Memory changes   . Dementia     Past Surgical History  Procedure Laterality Date  . Carpal tunnel release  2004  . Inguinal hernia repair    . Transurethral resection of prostate    . Tonsillectomy and adenoidectomy    . Hand surgery  2007    right hand  . Knee surgery  1961    left knee  . Joint replacement      left total knee  . Tonsillectomy    . Eye surgery      cataracts  . Carpal tunnel release  10/31/2011    Procedure: CARPAL TUNNEL RELEASE;  Surgeon: Cammie Sickle., MD;  Location: [facility];  Service: Orthopedics;  Laterality: Left;    Current Outpatient Prescriptions  Medication Sig Dispense Refill  . acetaminophen (TYLENOL) 650 MG CR tablet Take 1,300 mg by mouth as needed for pain.    Marland Kitchen allopurinol (ZYLOPRIM) 100 MG tablet Take 100 mg by mouth daily.    Marland Kitchen amoxicillin (AMOXIL) 500 MG capsule Take 500 mg by  mouth. To use before dental appointments    . aspirin 81 MG tablet Take 81 mg by mouth daily.    . Cholecalciferol (VITAMIN D-3 PO) Take by mouth.    . Diltiazem HCl ER 360 MG CP24 Take 1 tablet by mouth daily.    Marland Kitchen donepezil (ARICEPT) 5 MG tablet     . EPIPEN 2-PAK 0.3 MG/0.3ML SOAJ injection   0  . finasteride (PROSCAR) 5 MG tablet Take 5 mg by mouth daily.      Marland Kitchen lisinopril (PRINIVIL,ZESTRIL) 10 MG tablet TAKE ONE TABLET EVERY DAY 30 tablet 3  . metroNIDAZOLE (METROGEL) 1 % gel     . NON FORMULARY CPAP at NIGHT    . omeprazole (PRILOSEC) 40 MG capsule Take 1 capsule (40 mg total) by mouth daily. 30 capsule 6  . ondansetron (ZOFRAN) 4 MG tablet Take 1 tablet (4 mg total) by mouth every 8 (eight) hours as needed for nausea or vomiting. 30 tablet 1  . ONETOUCH VERIO test strip      No current facility-administered medications for this visit.    Allergies:   Ciprofloxacin   Social History:  The patient  reports that he has  never smoked. He has never used smokeless tobacco. He reports that he drinks alcohol. He reports that he does not use illicit drugs.   Family History:  The patient's  family history includes Thalassemia in his son. There is no history of Colon cancer or Stomach cancer.    ROS:  Please see the history of present illness.  Otherwise, review of systems is positive for sleep apnea, snoring, anxiety, depression, frequent urination, and hearing loss.  All other systems are reviewed and negative.    PHYSICAL EXAM: VS:  BP 170/88 mmHg  Pulse 67  Ht 6\' 1"  (1.854 m)  Wt 279 lb (126.554 kg)  BMI 36.82 kg/m2 , BMI Body mass index is 36.82 kg/(m^2). GEN: Well nourished, well developed, pleasant elderly male in no acute distress HEENT: normal Neck: no JVD, carotid bruits, or masses Cardiac: RRR; no murmurs, rubs, or gallops. Trace pretibial edema bilaterally Respiratory:  clear to auscultation bilaterally, normal work of breathing GI: soft, nontender, nondistended, + BS MS:  no deformity or atrophy Skin: warm and dry, no rash Neuro:  Strength and sensation are intact Psych: euthymic mood, full affect  EKG:  EKG is ordered today. The ekg ordered today shows normal sinus rhythm 67 bpm with left anterior fascicular block.  Recent Labs: 11/11/2013: Hemoglobin 13.2; Platelets 195.0 07/10/2014: ALT 33; BUN 19; Creatinine 1.4; Potassium 4.3; Sodium 138   Lipid Panel     Component Value Date/Time   CHOL 151 11/11/2013 0730   TRIG 112.0 11/11/2013 0730   TRIG 159* 08/21/2006 1529   HDL 33.00* 11/11/2013 0730   CHOLHDL 5 11/11/2013 0730   VLDL 22.4 11/11/2013 0730   LDLCALC 96 11/11/2013 0730   LDLDIRECT 97.8 05/26/2009 1131      Wt Readings from Last 3 Encounters:  10/27/14 279 lb (126.554 kg)  07/10/14 279 lb 9.6 oz (126.826 kg)  02/13/14 280 lb 8 oz (127.234 kg)     Other studies Reviewed: Myoview 10/31/2012: Impression Exercise Capacity: Fair exercise capacity. BP Response: Hypertensive blood pressure response. Clinical Symptoms: There is dyspnea. ECG Impression: No significant ST segment change suggestive of ischemia. Comparison with Prior Nuclear Study: No previous nuclear study performed  Overall Impression: Normal stress nuclear study with a small, mild, fixed apical defect consistent with thinning; no ischemia.  LV Ejection Fraction: 62%. LV Wall Motion: NL LV Function; NL Wall Motion   ASSESSMENT AND PLAN: 1.  Essential hypertension, uncontrolled: The patient ran out of lisinopril several days ago. We will refill this. He should continue on diltiazem as well. I will plan on seeing him back in one year for follow-up evaluation. They understand to call if any problems arise.  2. Hyperlipidemia: Last lipids reviewed with a cholesterol 151, LDL 96, HDL 33, and triglycerides 112.  Current medicines are reviewed with the patient today.  The patient does not have concerns regarding medicines.  The following changes have been made:     Resume lisinopril  Labs/ tests ordered today include:  No orders of the defined types were placed in this encounter.    Disposition:   FU with me in 12 months. Lab work followed through primary care.  Signed, Sherren Mocha, MD  10/27/2014 4:09 PM    [facility] [place], [place], [place]  [address] Phone: [phone]; Fax: [phone]

## 2014-10-27 NOTE — Patient Instructions (Signed)
Your physician wants you to follow-up in: 1 YEAR with Dr Cooper.  You will receive a reminder letter in the mail two months in advance. If you don't receive a letter, please call our office to schedule the follow-up appointment.  Your physician recommends that you continue on your current medications as directed. Please refer to the Current Medication list given to you today.  

## 2014-11-12 DIAGNOSIS — G4733 Obstructive sleep apnea (adult) (pediatric): Secondary | ICD-10-CM | POA: Diagnosis not present

## 2014-11-12 DIAGNOSIS — G4710 Hypersomnia, unspecified: Secondary | ICD-10-CM | POA: Diagnosis not present

## 2014-11-12 DIAGNOSIS — G3184 Mild cognitive impairment, so stated: Secondary | ICD-10-CM | POA: Diagnosis not present

## 2014-11-12 DIAGNOSIS — I10 Essential (primary) hypertension: Secondary | ICD-10-CM | POA: Diagnosis not present

## 2014-11-18 ENCOUNTER — Ambulatory Visit (INDEPENDENT_AMBULATORY_CARE_PROVIDER_SITE_OTHER): Payer: Medicare Other | Admitting: Ophthalmology

## 2014-11-18 DIAGNOSIS — H35373 Puckering of macula, bilateral: Secondary | ICD-10-CM

## 2014-11-18 DIAGNOSIS — H43813 Vitreous degeneration, bilateral: Secondary | ICD-10-CM

## 2014-11-18 DIAGNOSIS — D3131 Benign neoplasm of right choroid: Secondary | ICD-10-CM

## 2014-11-18 DIAGNOSIS — I10 Essential (primary) hypertension: Secondary | ICD-10-CM | POA: Diagnosis not present

## 2014-11-18 DIAGNOSIS — H35033 Hypertensive retinopathy, bilateral: Secondary | ICD-10-CM

## 2014-11-18 DIAGNOSIS — E11319 Type 2 diabetes mellitus with unspecified diabetic retinopathy without macular edema: Secondary | ICD-10-CM

## 2014-11-18 DIAGNOSIS — E11329 Type 2 diabetes mellitus with mild nonproliferative diabetic retinopathy without macular edema: Secondary | ICD-10-CM | POA: Diagnosis not present

## 2014-12-01 DIAGNOSIS — G9389 Other specified disorders of brain: Secondary | ICD-10-CM | POA: Diagnosis not present

## 2014-12-01 DIAGNOSIS — G3184 Mild cognitive impairment, so stated: Secondary | ICD-10-CM | POA: Diagnosis not present

## 2014-12-07 DIAGNOSIS — R05 Cough: Secondary | ICD-10-CM | POA: Diagnosis not present

## 2014-12-07 DIAGNOSIS — Z6837 Body mass index (BMI) 37.0-37.9, adult: Secondary | ICD-10-CM | POA: Diagnosis not present

## 2014-12-07 DIAGNOSIS — H6123 Impacted cerumen, bilateral: Secondary | ICD-10-CM | POA: Diagnosis not present

## 2014-12-07 DIAGNOSIS — I10 Essential (primary) hypertension: Secondary | ICD-10-CM | POA: Diagnosis not present

## 2014-12-07 DIAGNOSIS — J309 Allergic rhinitis, unspecified: Secondary | ICD-10-CM | POA: Diagnosis not present

## 2014-12-07 DIAGNOSIS — H9193 Unspecified hearing loss, bilateral: Secondary | ICD-10-CM | POA: Diagnosis not present

## 2014-12-10 DIAGNOSIS — R2681 Unsteadiness on feet: Secondary | ICD-10-CM | POA: Diagnosis not present

## 2014-12-15 DIAGNOSIS — J329 Chronic sinusitis, unspecified: Secondary | ICD-10-CM | POA: Diagnosis not present

## 2014-12-28 DIAGNOSIS — R2681 Unsteadiness on feet: Secondary | ICD-10-CM | POA: Diagnosis not present

## 2014-12-28 DIAGNOSIS — R278 Other lack of coordination: Secondary | ICD-10-CM | POA: Diagnosis not present

## 2014-12-30 DIAGNOSIS — R2681 Unsteadiness on feet: Secondary | ICD-10-CM | POA: Diagnosis not present

## 2014-12-30 DIAGNOSIS — R278 Other lack of coordination: Secondary | ICD-10-CM | POA: Diagnosis not present

## 2015-01-06 DIAGNOSIS — R2681 Unsteadiness on feet: Secondary | ICD-10-CM | POA: Diagnosis not present

## 2015-01-06 DIAGNOSIS — R278 Other lack of coordination: Secondary | ICD-10-CM | POA: Diagnosis not present

## 2015-01-08 DIAGNOSIS — R2681 Unsteadiness on feet: Secondary | ICD-10-CM | POA: Diagnosis not present

## 2015-01-08 DIAGNOSIS — R278 Other lack of coordination: Secondary | ICD-10-CM | POA: Diagnosis not present

## 2015-01-12 DIAGNOSIS — R2681 Unsteadiness on feet: Secondary | ICD-10-CM | POA: Diagnosis not present

## 2015-01-12 DIAGNOSIS — R278 Other lack of coordination: Secondary | ICD-10-CM | POA: Diagnosis not present

## 2015-01-15 DIAGNOSIS — R278 Other lack of coordination: Secondary | ICD-10-CM | POA: Diagnosis not present

## 2015-01-15 DIAGNOSIS — R2681 Unsteadiness on feet: Secondary | ICD-10-CM | POA: Diagnosis not present

## 2015-01-18 ENCOUNTER — Other Ambulatory Visit: Payer: Self-pay | Admitting: Dermatology

## 2015-01-18 DIAGNOSIS — L821 Other seborrheic keratosis: Secondary | ICD-10-CM | POA: Diagnosis not present

## 2015-01-18 DIAGNOSIS — Z85828 Personal history of other malignant neoplasm of skin: Secondary | ICD-10-CM | POA: Diagnosis not present

## 2015-01-18 DIAGNOSIS — L218 Other seborrheic dermatitis: Secondary | ICD-10-CM | POA: Diagnosis not present

## 2015-01-18 DIAGNOSIS — C44311 Basal cell carcinoma of skin of nose: Secondary | ICD-10-CM | POA: Diagnosis not present

## 2015-01-18 DIAGNOSIS — D485 Neoplasm of uncertain behavior of skin: Secondary | ICD-10-CM | POA: Diagnosis not present

## 2015-01-19 DIAGNOSIS — R2681 Unsteadiness on feet: Secondary | ICD-10-CM | POA: Diagnosis not present

## 2015-01-19 DIAGNOSIS — R278 Other lack of coordination: Secondary | ICD-10-CM | POA: Diagnosis not present

## 2015-01-22 DIAGNOSIS — R2681 Unsteadiness on feet: Secondary | ICD-10-CM | POA: Diagnosis not present

## 2015-01-22 DIAGNOSIS — R278 Other lack of coordination: Secondary | ICD-10-CM | POA: Diagnosis not present

## 2015-01-25 DIAGNOSIS — Z87828 Personal history of other (healed) physical injury and trauma: Secondary | ICD-10-CM | POA: Diagnosis not present

## 2015-01-25 DIAGNOSIS — G3184 Mild cognitive impairment, so stated: Secondary | ICD-10-CM | POA: Diagnosis not present

## 2015-01-25 DIAGNOSIS — F0280 Dementia in other diseases classified elsewhere without behavioral disturbance: Secondary | ICD-10-CM | POA: Diagnosis not present

## 2015-01-25 DIAGNOSIS — I10 Essential (primary) hypertension: Secondary | ICD-10-CM | POA: Diagnosis not present

## 2015-01-25 DIAGNOSIS — G4733 Obstructive sleep apnea (adult) (pediatric): Secondary | ICD-10-CM | POA: Diagnosis not present

## 2015-01-26 DIAGNOSIS — R278 Other lack of coordination: Secondary | ICD-10-CM | POA: Diagnosis not present

## 2015-01-26 DIAGNOSIS — R2681 Unsteadiness on feet: Secondary | ICD-10-CM | POA: Diagnosis not present

## 2015-02-03 DIAGNOSIS — Z85828 Personal history of other malignant neoplasm of skin: Secondary | ICD-10-CM | POA: Diagnosis not present

## 2015-02-03 DIAGNOSIS — C44311 Basal cell carcinoma of skin of nose: Secondary | ICD-10-CM | POA: Diagnosis not present

## 2015-02-16 ENCOUNTER — Other Ambulatory Visit: Payer: Self-pay | Admitting: Physician Assistant

## 2015-02-22 DIAGNOSIS — F0390 Unspecified dementia without behavioral disturbance: Secondary | ICD-10-CM | POA: Diagnosis not present

## 2015-03-11 DIAGNOSIS — E119 Type 2 diabetes mellitus without complications: Secondary | ICD-10-CM | POA: Diagnosis not present

## 2015-03-12 DIAGNOSIS — E785 Hyperlipidemia, unspecified: Secondary | ICD-10-CM | POA: Diagnosis not present

## 2015-03-12 DIAGNOSIS — E119 Type 2 diabetes mellitus without complications: Secondary | ICD-10-CM | POA: Insufficient documentation

## 2015-03-12 DIAGNOSIS — R8290 Unspecified abnormal findings in urine: Secondary | ICD-10-CM | POA: Diagnosis not present

## 2015-03-12 DIAGNOSIS — M109 Gout, unspecified: Secondary | ICD-10-CM | POA: Diagnosis not present

## 2015-03-12 DIAGNOSIS — Z125 Encounter for screening for malignant neoplasm of prostate: Secondary | ICD-10-CM | POA: Diagnosis not present

## 2015-03-12 DIAGNOSIS — I10 Essential (primary) hypertension: Secondary | ICD-10-CM | POA: Diagnosis not present

## 2015-03-16 DIAGNOSIS — Z6836 Body mass index (BMI) 36.0-36.9, adult: Secondary | ICD-10-CM | POA: Diagnosis not present

## 2015-03-16 DIAGNOSIS — I129 Hypertensive chronic kidney disease with stage 1 through stage 4 chronic kidney disease, or unspecified chronic kidney disease: Secondary | ICD-10-CM | POA: Diagnosis not present

## 2015-03-16 DIAGNOSIS — R413 Other amnesia: Secondary | ICD-10-CM | POA: Diagnosis not present

## 2015-03-16 DIAGNOSIS — I10 Essential (primary) hypertension: Secondary | ICD-10-CM | POA: Diagnosis not present

## 2015-03-16 DIAGNOSIS — N401 Enlarged prostate with lower urinary tract symptoms: Secondary | ICD-10-CM | POA: Diagnosis not present

## 2015-03-16 DIAGNOSIS — R7309 Other abnormal glucose: Secondary | ICD-10-CM | POA: Diagnosis not present

## 2015-03-16 DIAGNOSIS — E785 Hyperlipidemia, unspecified: Secondary | ICD-10-CM | POA: Diagnosis not present

## 2015-03-16 DIAGNOSIS — Z1389 Encounter for screening for other disorder: Secondary | ICD-10-CM | POA: Diagnosis not present

## 2015-03-16 DIAGNOSIS — G4733 Obstructive sleep apnea (adult) (pediatric): Secondary | ICD-10-CM | POA: Diagnosis not present

## 2015-03-16 DIAGNOSIS — E119 Type 2 diabetes mellitus without complications: Secondary | ICD-10-CM | POA: Diagnosis not present

## 2015-03-16 DIAGNOSIS — E559 Vitamin D deficiency, unspecified: Secondary | ICD-10-CM | POA: Diagnosis not present

## 2015-04-05 ENCOUNTER — Ambulatory Visit (INDEPENDENT_AMBULATORY_CARE_PROVIDER_SITE_OTHER): Payer: Medicare Other | Admitting: Neurology

## 2015-04-05 ENCOUNTER — Encounter: Payer: Self-pay | Admitting: Neurology

## 2015-04-05 VITALS — BP 154/77 | HR 67 | Ht 75.0 in | Wt 277.4 lb

## 2015-04-05 DIAGNOSIS — R413 Other amnesia: Secondary | ICD-10-CM | POA: Diagnosis not present

## 2015-04-05 DIAGNOSIS — R482 Apraxia: Secondary | ICD-10-CM | POA: Insufficient documentation

## 2015-04-05 DIAGNOSIS — R251 Tremor, unspecified: Secondary | ICD-10-CM | POA: Diagnosis not present

## 2015-04-05 DIAGNOSIS — E538 Deficiency of other specified B group vitamins: Secondary | ICD-10-CM | POA: Diagnosis not present

## 2015-04-05 DIAGNOSIS — R269 Unspecified abnormalities of gait and mobility: Secondary | ICD-10-CM

## 2015-04-05 HISTORY — DX: Tremor, unspecified: R25.1

## 2015-04-05 HISTORY — DX: Other amnesia: R41.3

## 2015-04-05 HISTORY — DX: Unspecified abnormalities of gait and mobility: R26.9

## 2015-04-05 MED ORDER — DONEPEZIL HCL 10 MG PO TABS: 10.0000 mg | ORAL_TABLET | Freq: Every day | ORAL | Status: DC

## 2015-04-05 NOTE — Progress Notes (Signed)
Reason for visit: Dementia, gait disorder  Referring physician: Dr. Constance Goltz is a 74 y.o. male  History of present illness:  [name] is a 74 year old right-handed white male with a history of a progressive dementing illness date back about 5 years. He has been followed through [facility] in a study involving athletes with memory problems. 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 The patient has been on low-dose Aricept taking 5 mg a day. The patient has been has undergone a series of MRI evaluations of the brain, the last one was done in February, 2016. The actual images are not available for my review. The patient has undergone a series of neuropsychological evaluations as well. As time has gone on, the patient has become less verbal, he no longer is operating a motor vehicle, and he is not able to keep up with medications, appointments, or with finances. The patient has a younger sister who also has a progressive dementing illness. The patient has sleep apnea, on CPAP. He denies any issues controlling the bowels or the bladder. He does report some neck discomfort, and over the last 3 years, he has developed a progressive gait disorder. The patient has minimal small vessel disease by MRI. The patient denies any numbness of the extremities. The patient has not had any falls, but he will stumble on occasion. He is sent to this office for further evaluation. The patient has tremors involving both upper extremities. No family history of tremors is noted.  Past Medical History  Diagnosis Date  . Obstructive sleep apnea   . Gout   . Hypertension   . Benign prostatic hypertrophy   . Rosacea, acne   . Elevated PSA   . Nephrolithiasis     hx of  . Erectile dysfunction   . Partial Achilles tendon tear   . Diabetes mellitus type II     no meds  . Memory changes   . Dementia   . Memory difficulties 04/05/2015  . Abnormality of gait 04/05/2015  . Tremor  04/05/2015    Past Surgical History  Procedure Laterality Date  . Carpal tunnel release  2004  . Inguinal hernia repair    . Transurethral resection of prostate    . Tonsillectomy and adenoidectomy    . Hand surgery  2007    right hand  . Knee surgery  1961    left knee  . Joint replacement      left total knee  . Tonsillectomy    . Eye surgery      cataracts  . Carpal tunnel release  10/31/2011    Procedure: CARPAL TUNNEL RELEASE;  Surgeon: Cammie Sickle., MD;  Location: [facility];  Service: Orthopedics;  Laterality: Left;    Family History  Problem Relation Age of Onset  . Thalassemia Son   . Colon cancer Neg Hx   . Stomach cancer Neg Hx   . Heart disease Mother   . Dementia Sister   . COPD Sister   . Diabetes Sister   . Heart disease Paternal Grandfather   . Dementia Cousin     Social history:  reports that he has never smoked. He has never used smokeless tobacco. He reports that he does not drink alcohol or use illicit drugs.  Medications:  Prior to Admission medications   Medication Sig Start Date End Date Taking? Authorizing Provider  acetaminophen (TYLENOL) 650 MG CR tablet Take 1,300 mg by mouth  as needed for pain.   Yes Historical Provider, MD  allopurinol (ZYLOPRIM) 100 MG tablet Take 300 mg by mouth daily.  06/06/11  Yes Lisabeth Pick, MD  amoxicillin (AMOXIL) 500 MG capsule Take 500 mg by mouth. To use before dental appointments   Yes Historical Provider, MD  aspirin 81 MG tablet Take 81 mg by mouth daily.   Yes Historical Provider, MD  Cholecalciferol (VITAMIN D-3 PO) Take 2,000 Units by mouth daily.    Yes Historical Provider, MD  colchicine 0.6 MG tablet Take 0.6 mg by mouth daily.   Yes Historical Provider, MD  Diltiazem HCl ER 360 MG CP24 Take 1 tablet by mouth daily. 10/16/13  Yes Historical Provider, MD  EPIPEN 2-PAK 0.3 MG/0.3ML SOAJ injection  07/22/14  Yes Historical Provider, MD  finasteride (PROSCAR) 5 MG tablet Take 5 mg by  mouth daily.     Yes Historical Provider, MD  lisinopril (PRINIVIL,ZESTRIL) 10 MG tablet Take 1 tablet (10 mg total) by mouth daily. 10/27/14  Yes Sherren Mocha, MD  metroNIDAZOLE (METROGEL) 1 % gel  12/06/13  Yes Historical Provider, MD  modafinil (PROVIGIL) 200 MG tablet Take 200 mg by mouth 2 (two) times daily.   Yes Historical Provider, MD  NON FORMULARY CPAP at NIGHT   Yes Historical Provider, MD  omeprazole (PRILOSEC) 40 MG capsule Take 1 capsule (40 mg total) by mouth daily. 02/16/15  Yes Irene Shipper, MD  ondansetron (ZOFRAN) 4 MG tablet Take 1 tablet (4 mg total) by mouth every 8 (eight) hours as needed for nausea or vomiting. 02/13/14  Yes Willia Craze, NP  ONETOUCH VERIO test strip  11/17/11  Yes Historical Provider, MD  donepezil (ARICEPT) 10 MG tablet Take 1 tablet (10 mg total) by mouth at bedtime. 04/05/15   Kathrynn Ducking, MD      Allergies  Allergen Reactions  . Ciprofloxacin     REACTION: Diarrhea,l rectal irritation    ROS:  Out of a complete 14 system review of symptoms, the patient complains only of the following symptoms, and all other reviewed systems are negative.  Fatigue Shortness of breath Memory loss, confusion, tremor Anxiety, disinterest in activities  Blood pressure 154/77, pulse 67, height 6\' 3"  (1.905 m), weight 277 lb 6.4 oz (125.828 kg).  Physical Exam  General: The patient is alert and cooperative at the time of the examination.  Eyes: Pupils are equal, round, and reactive to light. Discs are flat bilaterally.  Neck: The neck is supple, no carotid bruits are noted.  Respiratory: The respiratory examination is clear.  Cardiovascular: The cardiovascular examination reveals a regular rate and rhythm, no obvious murmurs or rubs are noted.  Skin: Extremities are without significant edema.  Neurologic Exam  Mental status: The patient is alert and oriented x 2 at the time of the examination (not oriented to date). The Mini-Mental status  examination done today shows a total score of 20/30. The patient is able to name 6 animals in 30 seconds.  Cranial nerves: Facial symmetry is present. There is good sensation of the face to pinprick and soft touch bilaterally. The strength of the facial muscles and the muscles to head turning and shoulder shrug are normal bilaterally. Speech is well enunciated, no aphasia or dysarthria is noted. Extraocular movements are full. Visual fields are full. The tongue is midline, and the patient has symmetric elevation of the soft palate. No obvious hearing deficits are noted.  Motor: The motor testing reveals 5 over 5  strength of all 4 extremities. Good symmetric motor tone is noted throughout.  Sensory: Sensory testing is intact to pinprick, soft touch, vibration sensation, and position sense on all 4 extremities, with exception that position sense is decreased in both feet. No evidence of extinction is noted.  Coordination: Cerebellar testing reveals good finger-nose-finger and heel-to-shin bilaterally. The patient has an intention tremor with finger-nose-finger bilaterally. Some apraxia with the use of the lower extremities is noted.  Gait and station: Gait is slightly wide-based. The patient does not use a cane for an ambulation. Tandem gait is unsteady. Romberg is negative. No drift is seen.  Reflexes: Deep tendon reflexes are symmetric and normal bilaterally. Toes are downgoing bilaterally.   MRI brain 10/05/12:  IMPRESSION: No unexpected pathologic finding. Typical age related atrophy and minimal small vessel change of the hemispheric white matter.  * MRI scan images were reviewed online. I agree with the written report.    Assessment/Plan:  1. Progressive memory disturbance  2. Gait disturbance  3. Tremors  The patient has undergone a series of MRI evaluations of the brain over time. This was done through [facility], we will try to get the disc of the most recent one done in  February 2016. The patient will be sent for blood work today. He does have history of neck discomfort, and we will consider MRI evaluation of the cervical spine to evaluate the gait disorder in the future if the blood work is unremarkable. The patient will be increased on Aricept taking 10 mg at night. He will follow-up in 4 months. The patient is not operating motor vehicle at this time.  Jill Alexanders MD 04/05/2015 8:34 PM  [facility] [address] [place], [place] [address]  Phone [phone] Fax [phone]

## 2015-04-05 NOTE — Patient Instructions (Signed)

## 2015-04-06 ENCOUNTER — Telehealth: Payer: Self-pay

## 2015-04-06 NOTE — Telephone Encounter (Signed)
-----   Message from Kathrynn Ducking, MD sent at 04/05/2015  8:38 PM EDT ----- Please contact [facility] and get the disc for the MRI brain done in February 2016 sent to our office. Thanks.

## 2015-04-06 NOTE — Telephone Encounter (Signed)
Request for MRI of the brain faxed to [facility] records [phone]). Confirmation received.

## 2015-04-07 ENCOUNTER — Telehealth: Payer: Self-pay | Admitting: Neurology

## 2015-04-07 DIAGNOSIS — R269 Unspecified abnormalities of gait and mobility: Secondary | ICD-10-CM

## 2015-04-07 LAB — METHYLMALONIC ACID, SERUM: Methylmalonic Acid: 335 nmol/L (ref 0–378)

## 2015-04-07 LAB — RPR: RPR Ser Ql: NONREACTIVE

## 2015-04-07 LAB — VITAMIN B12: VITAMIN B 12: 446 pg/mL (ref 211–946)

## 2015-04-07 NOTE — Telephone Encounter (Signed)
I called the patient, the blood work is unremarkable. We are trying to get the disc of the MRI the brain from [facility], given the neck pain in the gait disorder, I will check a MRI of the cervical spine if they are amenable to this study. They are to contact our office if it is okay to set up.

## 2015-04-08 NOTE — Telephone Encounter (Signed)
I called the patient's wife. I let her know that I would pass her message to Dr. Jannifer Franklin so he could order the MRI. I advised her that they would be contacted by our MRI coordinator to schedule the MRI.

## 2015-04-08 NOTE — Telephone Encounter (Signed)
I will get the MRI the cervical spine set up.

## 2015-04-08 NOTE — Telephone Encounter (Signed)
Patient's wife is calling back and states the patient does agree to have an MRI of the cervical spine. Please call.

## 2015-04-08 NOTE — Addendum Note (Signed)
Addended by: Margette Fast on: 04/08/2015 06:06 PM   Modules accepted: Orders

## 2015-04-15 NOTE — Telephone Encounter (Signed)
I tried to call [facility] to get through. I will try again later.

## 2015-04-16 NOTE — Telephone Encounter (Signed)
I called [name], the patient's wife. She stated she would come by one day next week to fill out [name]'s form. She has POA.

## 2015-04-16 NOTE — Telephone Encounter (Signed)
I called and spoke to [facility] in medical records. She stated that they did not have all the information they needed in my request and it was cancelled. They require it to state their name, since we are requesting it from them. They also require a service date on the request and an authorization to release information signed by the patient. This authorization can be found online. I called the patient to ask him to come in and fill this out or to fill it out and bring it in. He was not sure exactly where our office is and asked me to call back later to speak to his wife.

## 2015-04-19 NOTE — Telephone Encounter (Signed)
Authorization to release information and letter to request information faxed to [facility] medical record office. Confirmation received.

## 2015-04-21 ENCOUNTER — Telehealth: Payer: Self-pay | Admitting: Neurology

## 2015-04-21 NOTE — Telephone Encounter (Signed)
I have received records from [facility] regarding this patient. The patient has had an EEG study done on 09/09/2013 that was unremarkable. MRI the brain that was done on 09/09/2013 shows mild white matter changes consistent with small vessel disease. 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 The patient is felt to have severe obstructive sleep apnea. The patient was being evaluated for possible normal pressure hydrocephalus. A repeat MRI was done I believe around 12/02/2014. This shows moderate enlargement of the lateral and third ventricles, questionable normal pressure hydrocephalus was entertained. There is central volume loss.

## 2015-04-26 ENCOUNTER — Ambulatory Visit
Admission: RE | Admit: 2015-04-26 | Discharge: 2015-04-26 | Disposition: A | Discharge status: Home / Self Care | Payer: Medicare Other | Source: Ambulatory Visit | Attending: Neurology | Admitting: Neurology

## 2015-04-26 DIAGNOSIS — R269 Unspecified abnormalities of gait and mobility: Secondary | ICD-10-CM

## 2015-04-26 DIAGNOSIS — M47812 Spondylosis without myelopathy or radiculopathy, cervical region: Secondary | ICD-10-CM | POA: Diagnosis not present

## 2015-04-26 DIAGNOSIS — M5022 Other cervical disc displacement, mid-cervical region: Secondary | ICD-10-CM | POA: Diagnosis not present

## 2015-04-26 DIAGNOSIS — M4802 Spinal stenosis, cervical region: Secondary | ICD-10-CM | POA: Diagnosis not present

## 2015-04-26 DIAGNOSIS — R937 Abnormal findings on diagnostic imaging of other parts of musculoskeletal system: Secondary | ICD-10-CM | POA: Diagnosis not present

## 2015-04-27 ENCOUNTER — Telehealth: Payer: Self-pay | Admitting: Neurology

## 2015-04-27 NOTE — Telephone Encounter (Signed)
  I called the patient. The MRI of the cervical spine was done secondary to a significant gait problem, there is no evidence of spinal cord compression that would explain the gait issues.    MRI cervical 04/27/15:  IMPRESSION: Abnormal MRI scan of the cervical spine showing prominent spondylitic changes most significant at C4-5 where there is broad-based disc osteophyte bulge with mild canal and moderate left greater than right foraminal narrowing. There is also mild left-sided foraminal narrowing at C3-4 and bilateral at C5-6.

## 2015-04-29 DIAGNOSIS — J019 Acute sinusitis, unspecified: Secondary | ICD-10-CM | POA: Diagnosis not present

## 2015-04-29 DIAGNOSIS — Z6836 Body mass index (BMI) 36.0-36.9, adult: Secondary | ICD-10-CM | POA: Diagnosis not present

## 2015-04-29 DIAGNOSIS — R05 Cough: Secondary | ICD-10-CM | POA: Diagnosis not present

## 2015-04-29 NOTE — Telephone Encounter (Signed)
Disc received from [facility].

## 2015-05-03 ENCOUNTER — Telehealth: Payer: Self-pay | Admitting: Neurology

## 2015-05-03 MED ORDER — SERTRALINE HCL 50 MG PO TABS: ORAL_TABLET | ORAL | Status: DC

## 2015-05-03 NOTE — Telephone Encounter (Addendum)
I called the patient. I did get the written records from [facility], I did not receive a disc of the MRI the brain, only a written report of this. 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 I will call in a prescription for an antenna present, will call in Zoloft.

## 2015-05-03 NOTE — Telephone Encounter (Signed)
I spoke to [place]. We did receive the disc from [facility]. She would like to speak to Dr. Jannifer Franklin about the results of the MRI. She is concerned because the patient seems depressed. He cries at just about anything. She wondered if he could try an antidepressant medication and wanted to talk to Dr. Jannifer Franklin about this. Please call.

## 2015-05-03 NOTE — Addendum Note (Signed)
Addended by: Margette Fast on: 05/03/2015 06:18 PM   Modules accepted: Orders

## 2015-05-03 NOTE — Telephone Encounter (Signed)
Pt's wife called and would like to know if the disc from [name] was received. Please call and advise [phone]

## 2015-05-13 DIAGNOSIS — G912 (Idiopathic) normal pressure hydrocephalus: Secondary | ICD-10-CM | POA: Diagnosis not present

## 2015-05-13 DIAGNOSIS — G4710 Hypersomnia, unspecified: Secondary | ICD-10-CM | POA: Diagnosis not present

## 2015-05-13 DIAGNOSIS — G3184 Mild cognitive impairment, so stated: Secondary | ICD-10-CM | POA: Diagnosis not present

## 2015-05-13 DIAGNOSIS — G4733 Obstructive sleep apnea (adult) (pediatric): Secondary | ICD-10-CM | POA: Diagnosis not present

## 2015-05-18 DIAGNOSIS — G3184 Mild cognitive impairment, so stated: Secondary | ICD-10-CM | POA: Diagnosis not present

## 2015-05-27 DIAGNOSIS — G912 (Idiopathic) normal pressure hydrocephalus: Secondary | ICD-10-CM | POA: Diagnosis not present

## 2015-06-02 DIAGNOSIS — R413 Other amnesia: Secondary | ICD-10-CM | POA: Diagnosis not present

## 2015-06-03 DIAGNOSIS — R413 Other amnesia: Secondary | ICD-10-CM | POA: Diagnosis not present

## 2015-06-06 DIAGNOSIS — I10 Essential (primary) hypertension: Secondary | ICD-10-CM | POA: Diagnosis present

## 2015-06-06 DIAGNOSIS — M1A9XX0 Chronic gout, unspecified, without tophus (tophi): Secondary | ICD-10-CM | POA: Diagnosis present

## 2015-06-06 DIAGNOSIS — K219 Gastro-esophageal reflux disease without esophagitis: Secondary | ICD-10-CM | POA: Diagnosis present

## 2015-06-06 DIAGNOSIS — G912 (Idiopathic) normal pressure hydrocephalus: Secondary | ICD-10-CM | POA: Diagnosis present

## 2015-06-06 DIAGNOSIS — N400 Enlarged prostate without lower urinary tract symptoms: Secondary | ICD-10-CM | POA: Diagnosis present

## 2015-06-06 DIAGNOSIS — E119 Type 2 diabetes mellitus without complications: Secondary | ICD-10-CM | POA: Diagnosis present

## 2015-06-16 DIAGNOSIS — I10 Essential (primary) hypertension: Secondary | ICD-10-CM | POA: Diagnosis not present

## 2015-06-16 DIAGNOSIS — E119 Type 2 diabetes mellitus without complications: Secondary | ICD-10-CM | POA: Diagnosis not present

## 2015-06-16 DIAGNOSIS — Z23 Encounter for immunization: Secondary | ICD-10-CM | POA: Diagnosis not present

## 2015-06-16 DIAGNOSIS — Z6836 Body mass index (BMI) 36.0-36.9, adult: Secondary | ICD-10-CM | POA: Diagnosis not present

## 2015-06-16 DIAGNOSIS — E785 Hyperlipidemia, unspecified: Secondary | ICD-10-CM | POA: Diagnosis not present

## 2015-06-16 DIAGNOSIS — R312 Other microscopic hematuria: Secondary | ICD-10-CM | POA: Diagnosis not present

## 2015-06-16 DIAGNOSIS — F0390 Unspecified dementia without behavioral disturbance: Secondary | ICD-10-CM | POA: Diagnosis not present

## 2015-06-16 DIAGNOSIS — G912 (Idiopathic) normal pressure hydrocephalus: Secondary | ICD-10-CM | POA: Diagnosis not present

## 2015-06-16 DIAGNOSIS — E1122 Type 2 diabetes mellitus with diabetic chronic kidney disease: Secondary | ICD-10-CM | POA: Diagnosis not present

## 2015-06-16 DIAGNOSIS — M109 Gout, unspecified: Secondary | ICD-10-CM | POA: Diagnosis not present

## 2015-06-16 DIAGNOSIS — F419 Anxiety disorder, unspecified: Secondary | ICD-10-CM | POA: Diagnosis not present

## 2015-06-16 LAB — HM DIABETES FOOT EXAM

## 2015-06-28 DIAGNOSIS — G912 (Idiopathic) normal pressure hydrocephalus: Secondary | ICD-10-CM | POA: Diagnosis not present

## 2015-07-28 ENCOUNTER — Ambulatory Visit (INDEPENDENT_AMBULATORY_CARE_PROVIDER_SITE_OTHER): Payer: Medicare Other | Admitting: Neurology

## 2015-07-28 ENCOUNTER — Encounter: Payer: Self-pay | Admitting: Neurology

## 2015-07-28 VITALS — BP 156/79 | HR 64 | Ht 75.0 in | Wt 274.0 lb

## 2015-07-28 DIAGNOSIS — R413 Other amnesia: Secondary | ICD-10-CM

## 2015-07-28 DIAGNOSIS — R269 Unspecified abnormalities of gait and mobility: Secondary | ICD-10-CM | POA: Diagnosis not present

## 2015-07-28 DIAGNOSIS — R251 Tremor, unspecified: Secondary | ICD-10-CM | POA: Diagnosis not present

## 2015-07-28 NOTE — Patient Instructions (Signed)
Fall Prevention in the Home  Falls can cause injuries and can affect people from all age groups. There are many simple things that you can do to make your home safe and to help prevent falls. WHAT CAN I DO ON THE OUTSIDE OF MY HOME?  Regularly repair the edges of walkways and driveways and fix any cracks.  Remove high doorway thresholds.  Trim any shrubbery on the main path into your home.  Use bright outdoor lighting.  Clear walkways of debris and clutter, including tools and rocks.  Regularly check that handrails are securely fastened and in good repair. Both sides of any steps should have handrails.  Install guardrails along the edges of any raised decks or porches.  Have leaves, snow, and ice cleared regularly.  Use sand or salt on walkways during winter months.  In the garage, clean up any spills right away, including grease or oil spills. WHAT CAN I DO IN THE BATHROOM?  Use night lights.  Install grab bars by the toilet and in the tub and shower. Do not use towel bars as grab bars.  Use non-skid mats or decals on the floor of the tub or shower.  If you need to sit down while you are in the shower, use a plastic, non-slip stool..  Keep the floor dry. Immediately clean up any water that spills on the floor.  Remove soap buildup in the tub or shower on a regular basis.  Attach bath mats securely with double-sided non-slip rug tape.  Remove throw rugs and other tripping hazards from the floor. WHAT CAN I DO IN THE BEDROOM?  Use night lights.  Make sure that a bedside light is easy to reach.  Do not use oversized bedding that drapes onto the floor.  Have a firm chair that has side arms to use for getting dressed.  Remove throw rugs and other tripping hazards from the floor. WHAT CAN I DO IN THE KITCHEN?   Clean up any spills right away.  Avoid walking on wet floors.  Place frequently used items in easy-to-reach places.  If you need to reach for something  above you, use a sturdy step stool that has a grab bar.  Keep electrical cables out of the way.  Do not use floor polish or wax that makes floors slippery. If you have to use wax, make sure that it is non-skid floor wax.  Remove throw rugs and other tripping hazards from the floor. WHAT CAN I DO IN THE STAIRWAYS?  Do not leave any items on the stairs.  Make sure that there are handrails on both sides of the stairs. Fix handrails that are broken or loose. Make sure that handrails are as long as the stairways.  Check any carpeting to make sure that it is firmly attached to the stairs. Fix any carpet that is loose or worn.  Avoid having throw rugs at the top or bottom of stairways, or secure the rugs with carpet tape to prevent them from moving.  Make sure that you have a light switch at the top of the stairs and the bottom of the stairs. If you do not have them, have them installed. WHAT ARE SOME OTHER FALL PREVENTION TIPS?  Wear closed-toe shoes that fit well and support your feet. Wear shoes that have rubber soles or low heels.  When you use a stepladder, make sure that it is completely opened and that the sides are firmly locked. Have someone hold the ladder while you   are using it. Do not climb a closed stepladder.  Add color or contrast paint or tape to grab bars and handrails in your home. Place contrasting color strips on the first and last steps.  Use mobility aids as needed, such as canes, walkers, scooters, and crutches.  Turn on lights if it is dark. Replace any light bulbs that burn out.  Set up furniture so that there are clear paths. Keep the furniture in the same spot.  Fix any uneven floor surfaces.  Choose a carpet design that does not hide the edge of steps of a stairway.  Be aware of any and all pets.  Review your medicines with your healthcare provider. Some medicines can cause dizziness or changes in blood pressure, which increase your risk of falling. Talk  with your health care provider about other ways that you can decrease your risk of falls. This may include working with a physical therapist or trainer to improve your strength, balance, and endurance.   This information is not intended to replace advice given to you by your health care provider. Make sure you discuss any questions you have with your health care provider.   Document Released: 09/15/2002 Document Revised: 02/09/2015 Document Reviewed: 10/30/2014 Elsevier Interactive Patient Education 2016 [employer]  

## 2015-07-28 NOTE — Progress Notes (Signed)
Reason for visit: Memory disturbance  [name] is an 74 y.o. 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 The patient has been followed through [facility], and he has undergone a very thorough evaluation for possible normal pressure hydrocephalus. He has had a lumbar drain in for 3 days, he had a nuclear medicine cisternography evaluation, both evaluations were unremarkable. The patient was not recommended for a VP shunt placement. The patient continues on Aricept taking 10 mg a day. He is tolerating the medication well. He returns to the office today for an evaluation. He has not had any falls, he does not use a cane although one was recommended. He no longer operates a Teacher, music.  Past Medical History  Diagnosis Date  . Obstructive sleep apnea   . Gout   . Hypertension   . Benign prostatic hypertrophy   . Rosacea, acne   . Elevated PSA   . Nephrolithiasis     hx of  . Erectile dysfunction   . Partial Achilles tendon tear   . Diabetes mellitus type II     no meds  . Memory changes   . Dementia   . Memory difficulties 04/05/2015  . Abnormality of gait 04/05/2015  . Tremor 04/05/2015    Past Surgical History  Procedure Laterality Date  . Carpal tunnel release  2004  . Inguinal hernia repair    . Transurethral resection of prostate    . Tonsillectomy and adenoidectomy    . Hand surgery  2007    right hand  . Knee surgery  1961    left knee  . Joint replacement      left total knee  . Tonsillectomy    . Eye surgery      cataracts  . Carpal tunnel release  10/31/2011    Procedure: CARPAL TUNNEL RELEASE;  Surgeon: Cammie Sickle., MD;  Location: [facility];  Service: Orthopedics;  Laterality: Left;    Family History  Problem Relation Age of Onset  .  Thalassemia Son   . Colon cancer Neg Hx   . Stomach cancer Neg Hx   . Heart disease Mother   . Dementia Sister   . COPD Sister   . Diabetes Sister   . Heart disease Paternal Grandfather   . Dementia Cousin     Social history:  reports that he has never smoked. He has never used smokeless tobacco. He reports that he does not drink alcohol or use illicit drugs.    Allergies  Allergen Reactions  . Ciprofloxacin     REACTION: Diarrhea,l rectal irritation    Medications:  Prior to Admission medications   Medication Sig Start Date End Date Taking? Authorizing Provider  acetaminophen (TYLENOL) 650 MG CR tablet Take 1,300 mg by mouth as needed for pain.   Yes Historical Provider, MD  allopurinol (ZYLOPRIM) 100 MG tablet Take 300 mg by mouth daily.  06/06/11  Yes Lisabeth Pick, MD  amoxicillin (AMOXIL) 500 MG capsule Take 500 mg by mouth. To use before dental appointments   Yes Historical Provider, MD  aspirin 81 MG tablet Take 81 mg by mouth daily.   Yes Historical Provider, MD  Cholecalciferol (VITAMIN D-3 PO) Take 2,000 Units by mouth daily.    Yes  Historical Provider, MD  colchicine 0.6 MG tablet Take 0.6 mg by mouth daily.   Yes Historical Provider, MD  Diltiazem HCl ER 360 MG CP24 Take 1 tablet by mouth daily. 10/16/13  Yes Historical Provider, MD  donepezil (ARICEPT) 10 MG tablet Take 1 tablet (10 mg total) by mouth at bedtime. 04/05/15  Yes Kathrynn Ducking, MD  EPIPEN 2-PAK 0.3 MG/0.3ML SOAJ injection  07/22/14  Yes Historical Provider, MD  finasteride (PROSCAR) 5 MG tablet Take 5 mg by mouth daily.     Yes Historical Provider, MD  lisinopril (PRINIVIL,ZESTRIL) 10 MG tablet Take 1 tablet (10 mg total) by mouth daily. 10/27/14  Yes Sherren Mocha, MD  metroNIDAZOLE (METROGEL) 1 % gel  12/06/13  Yes Historical Provider, MD  modafinil (PROVIGIL) 200 MG tablet Take 200 mg by mouth 2 (two) times daily.   Yes Historical Provider, MD  NON FORMULARY CPAP at NIGHT   Yes Historical Provider, MD    omeprazole (PRILOSEC) 40 MG capsule Take 1 capsule (40 mg total) by mouth daily. 02/16/15  Yes Irene Shipper, MD  ondansetron (ZOFRAN) 4 MG tablet Take 1 tablet (4 mg total) by mouth every 8 (eight) hours as needed for nausea or vomiting. 02/13/14  Yes Willia Craze, NP  ONETOUCH VERIO test strip  11/17/11  Yes Historical Provider, MD  sertraline (ZOLOFT) 50 MG tablet 1 tablet daily for 2 weeks, then take 2 tablets daily 05/03/15  Yes Kathrynn Ducking, MD    ROS:  Out of a complete 14 system review of symptoms, the patient complains only of the following symptoms, and all other reviewed systems are negative.  Appetite change Runny nose Speech difficulty, tremors  Blood pressure 156/79, pulse 64, height 6\' 3"  (1.905 m), weight 274 lb (124.286 kg).  Physical Exam  General: The patient is alert and cooperative at the time of the examination. The patient is moderately obese.  Skin: No significant peripheral edema is noted.   Neurologic Exam  Mental status: The patient is alert and oriented x 2 at the time of the examination (not oriented to date). The Mini-Mental Status Examination done today shows a total score 21/30. The patient is able to name 8 animals in 30 seconds.   Cranial nerves: Facial symmetry is present. Speech is normal, no aphasia or dysarthria is noted. Extraocular movements are full. Visual fields are full.  Motor: The patient has good strength in all 4 extremities.  Sensory examination: Soft touch sensation is symmetric on the face, arms, and legs. There is some stocking pattern pinprick sensory deficit one half way up the legs bilaterally.  Coordination: The patient has good finger-nose-finger and heel-to-shin bilaterally. An intention tremor seen bilaterally with finger-nose-finger.  Gait and station: The patient has a wide-based gait. The patient is able to ambulate independently. Tandem gait is unsteady. Romberg is negative.  Reflexes: Deep tendon reflexes are  symmetric.   Assessment/Plan:  1. Memory disorder  2. Gait disorder  Prior MRI of the cervical spine did not show anything that would result in a significant gait problem. The patient has been evaluated for normal pressure hydrocephalus, a VP shunt was not recommended. The patient will need to cope with the gait problem, he should be using a cane or a walking stick when out of the house. The patient will follow-up in 6 months, sooner if needed. He will continue on Aricept.  Jill Alexanders MD 07/28/2015 7:00 PM  [facility] [address] [place]  [place], [place] [address]  Phone [phone] Fax [phone]

## 2015-08-19 ENCOUNTER — Ambulatory Visit: Payer: Medicare Other | Admitting: Neurology

## 2015-09-09 ENCOUNTER — Telehealth: Payer: Self-pay | Admitting: Neurology

## 2015-09-09 ENCOUNTER — Emergency Department (HOSPITAL_COMMUNITY): Payer: Medicare Other

## 2015-09-09 ENCOUNTER — Encounter (HOSPITAL_COMMUNITY): Payer: Self-pay | Admitting: Neurology

## 2015-09-09 ENCOUNTER — Other Ambulatory Visit: Payer: Self-pay

## 2015-09-09 ENCOUNTER — Emergency Department (HOSPITAL_COMMUNITY)
Admission: EM | Admit: 2015-09-09 | Discharge: 2015-09-09 | Disposition: A | Discharge status: Home / Self Care | Payer: Medicare Other | Attending: Emergency Medicine | Admitting: Emergency Medicine

## 2015-09-09 DIAGNOSIS — F0390 Unspecified dementia without behavioral disturbance: Secondary | ICD-10-CM | POA: Diagnosis not present

## 2015-09-09 DIAGNOSIS — R2689 Other abnormalities of gait and mobility: Secondary | ICD-10-CM | POA: Diagnosis not present

## 2015-09-09 DIAGNOSIS — Z87442 Personal history of urinary calculi: Secondary | ICD-10-CM | POA: Diagnosis not present

## 2015-09-09 DIAGNOSIS — I444 Left anterior fascicular block: Secondary | ICD-10-CM | POA: Diagnosis not present

## 2015-09-09 DIAGNOSIS — I10 Essential (primary) hypertension: Secondary | ICD-10-CM | POA: Insufficient documentation

## 2015-09-09 DIAGNOSIS — E119 Type 2 diabetes mellitus without complications: Secondary | ICD-10-CM | POA: Diagnosis not present

## 2015-09-09 DIAGNOSIS — Z792 Long term (current) use of antibiotics: Secondary | ICD-10-CM | POA: Insufficient documentation

## 2015-09-09 DIAGNOSIS — Z8669 Personal history of other diseases of the nervous system and sense organs: Secondary | ICD-10-CM | POA: Diagnosis not present

## 2015-09-09 DIAGNOSIS — Z79899 Other long term (current) drug therapy: Secondary | ICD-10-CM | POA: Diagnosis not present

## 2015-09-09 DIAGNOSIS — M109 Gout, unspecified: Secondary | ICD-10-CM | POA: Insufficient documentation

## 2015-09-09 DIAGNOSIS — Z872 Personal history of diseases of the skin and subcutaneous tissue: Secondary | ICD-10-CM | POA: Diagnosis not present

## 2015-09-09 DIAGNOSIS — R531 Weakness: Secondary | ICD-10-CM | POA: Diagnosis not present

## 2015-09-09 DIAGNOSIS — Z7982 Long term (current) use of aspirin: Secondary | ICD-10-CM | POA: Insufficient documentation

## 2015-09-09 DIAGNOSIS — R269 Unspecified abnormalities of gait and mobility: Secondary | ICD-10-CM

## 2015-09-09 DIAGNOSIS — Z87828 Personal history of other (healed) physical injury and trauma: Secondary | ICD-10-CM | POA: Insufficient documentation

## 2015-09-09 DIAGNOSIS — N400 Enlarged prostate without lower urinary tract symptoms: Secondary | ICD-10-CM | POA: Diagnosis not present

## 2015-09-09 LAB — CBC
HCT: 40.7 % (ref 39.0–52.0)
Hemoglobin: 13.7 g/dL (ref 13.0–17.0)
MCH: 22 pg — ABNORMAL LOW (ref 26.0–34.0)
MCHC: 33.7 g/dL (ref 30.0–36.0)
MCV: 65.2 fL — ABNORMAL LOW (ref 78.0–100.0)
Platelets: 195 10*3/uL (ref 150–400)
RBC: 6.24 MIL/uL — ABNORMAL HIGH (ref 4.22–5.81)
RDW: 16.3 % — ABNORMAL HIGH (ref 11.5–15.5)
WBC: 7.8 10*3/uL (ref 4.0–10.5)

## 2015-09-09 LAB — DIFFERENTIAL
Basophils Absolute: 0 10*3/uL (ref 0.0–0.1)
Basophils Relative: 0 %
Eosinophils Absolute: 0.2 10*3/uL (ref 0.0–0.7)
Eosinophils Relative: 2 %
Lymphocytes Relative: 22 %
Lymphs Abs: 1.7 10*3/uL (ref 0.7–4.0)
Monocytes Absolute: 1.3 10*3/uL — ABNORMAL HIGH (ref 0.1–1.0)
Monocytes Relative: 17 %
Neutro Abs: 4.6 10*3/uL (ref 1.7–7.7)
Neutrophils Relative %: 59 %

## 2015-09-09 LAB — I-STAT CHEM 8, ED
BUN: 23 mg/dL — ABNORMAL HIGH (ref 6–20)
Calcium, Ion: 1.2 mmol/L (ref 1.13–1.30)
Chloride: 106 mmol/L (ref 101–111)
Creatinine, Ser: 1.4 mg/dL — ABNORMAL HIGH (ref 0.61–1.24)
Glucose, Bld: 154 mg/dL — ABNORMAL HIGH (ref 65–99)
HCT: 45 % (ref 39.0–52.0)
Hemoglobin: 15.3 g/dL (ref 13.0–17.0)
Potassium: 4.1 mmol/L (ref 3.5–5.1)
Sodium: 140 mmol/L (ref 135–145)
TCO2: 22 mmol/L (ref 0–100)

## 2015-09-09 LAB — COMPREHENSIVE METABOLIC PANEL
ALT: 28 U/L (ref 17–63)
AST: 25 U/L (ref 15–41)
Albumin: 3.9 g/dL (ref 3.5–5.0)
Alkaline Phosphatase: 87 U/L (ref 38–126)
Anion gap: 8 (ref 5–15)
BUN: 20 mg/dL (ref 6–20)
CO2: 23 mmol/L (ref 22–32)
Calcium: 9.2 mg/dL (ref 8.9–10.3)
Chloride: 107 mmol/L (ref 101–111)
Creatinine, Ser: 1.5 mg/dL — ABNORMAL HIGH (ref 0.61–1.24)
GFR calc Af Amer: 50 mL/min — ABNORMAL LOW (ref >60)
GFR calc non Af Amer: 43 mL/min — ABNORMAL LOW (ref >60)
Glucose, Bld: 153 mg/dL — ABNORMAL HIGH (ref 65–99)
Potassium: 4.2 mmol/L (ref 3.5–5.1)
Sodium: 138 mmol/L (ref 135–145)
Total Bilirubin: 0.9 mg/dL (ref 0.3–1.2)
Total Protein: 6.6 g/dL (ref 6.5–8.1)

## 2015-09-09 LAB — URINALYSIS, ROUTINE W REFLEX MICROSCOPIC
Bilirubin Urine: NEGATIVE
Glucose, UA: NEGATIVE mg/dL
Hgb urine dipstick: NEGATIVE
Ketones, ur: NEGATIVE mg/dL
Leukocytes, UA: NEGATIVE
Nitrite: NEGATIVE
Protein, ur: NEGATIVE mg/dL
Specific Gravity, Urine: 1.014 (ref 1.005–1.030)
pH: 5.5 (ref 5.0–8.0)

## 2015-09-09 LAB — PROTIME-INR
INR: 1.12 (ref 0.00–1.49)
Prothrombin Time: 14.6 seconds (ref 11.6–15.2)

## 2015-09-09 LAB — RAPID URINE DRUG SCREEN, HOSP PERFORMED
Amphetamines: NOT DETECTED
Barbiturates: NOT DETECTED
Benzodiazepines: NOT DETECTED
Cocaine: NOT DETECTED
Opiates: NOT DETECTED
Tetrahydrocannabinol: NOT DETECTED

## 2015-09-09 LAB — APTT: aPTT: 36 seconds (ref 24–37)

## 2015-09-09 LAB — I-STAT TROPONIN, ED: Troponin i, poc: 0.02 ng/mL (ref 0.00–0.08)

## 2015-09-09 NOTE — ED Notes (Signed)
Pt's wife historian as pt has dementia. Last week pt just walked the dog and then he had a left side lean and was dragging his left foot. His wife reports his HR was 80 and he was sweaty last week. Had a similar episode on Tuesday and Wednesday late in the afternoon. His gait has changed in the last week to where he is unsteady also has left foot pain. Pt disoriented to time, and DOB which wife reports is out of character.

## 2015-09-09 NOTE — ED Provider Notes (Signed)
Patient's care assumed from [facility], please see their note for full details of H&P, in brief, Patient's wife with concern for the patient dragging left leg and leaning to the left when ambulating. Prior history of gait difficulty, but change from baseline. CT negative. Obtaining MRI. If MRI negative, plans to d/c home. No neuro symptoms here. Normal gait.   MRI showed no acute findings. Patient stable for discharge home.  Given instructions to follow up with PCP in the next couple of days for follow up and re-evaluation. Discussed strict return precautions for worsening or change of symptoms. Patient and his wife expressed understanding, no questions or concerns at time of discharge.   Nathaniel Man, MD 09/10/15 [id number]  Gareth Morgan, MD 09/13/15 [id number]

## 2015-09-09 NOTE — Telephone Encounter (Signed)
Left voicemail asking the patient's wife to call back.

## 2015-09-09 NOTE — Discharge Instructions (Signed)
Fall Prevention in the Home  Falls can cause injuries and can affect people from all age groups. There are many simple things that you can do to make your home safe and to help prevent falls. WHAT CAN I DO ON THE OUTSIDE OF MY HOME?  Regularly repair the edges of walkways and driveways and fix any cracks.  Remove high doorway thresholds.  Trim any shrubbery on the main path into your home.  Use bright outdoor lighting.  Clear walkways of debris and clutter, including tools and rocks.  Regularly check that handrails are securely fastened and in good repair. Both sides of any steps should have handrails.  Install guardrails along the edges of any raised decks or porches.  Have leaves, snow, and ice cleared regularly.  Use sand or salt on walkways during winter months.  In the garage, clean up any spills right away, including grease or oil spills. WHAT CAN I DO IN THE BATHROOM?  Use night lights.  Install grab bars by the toilet and in the tub and shower. Do not use towel bars as grab bars.  Use non-skid mats or decals on the floor of the tub or shower.  If you need to sit down while you are in the shower, use a plastic, non-slip stool..  Keep the floor dry. Immediately clean up any water that spills on the floor.  Remove soap buildup in the tub or shower on a regular basis.  Attach bath mats securely with double-sided non-slip rug tape.  Remove throw rugs and other tripping hazards from the floor. WHAT CAN I DO IN THE BEDROOM?  Use night lights.  Make sure that a bedside light is easy to reach.  Do not use oversized bedding that drapes onto the floor.  Have a firm chair that has side arms to use for getting dressed.  Remove throw rugs and other tripping hazards from the floor. WHAT CAN I DO IN THE KITCHEN?   Clean up any spills right away.  Avoid walking on wet floors.  Place frequently used items in easy-to-reach places.  If you need to reach for something  above you, use a sturdy step stool that has a grab bar.  Keep electrical cables out of the way.  Do not use floor polish or wax that makes floors slippery. If you have to use wax, make sure that it is non-skid floor wax.  Remove throw rugs and other tripping hazards from the floor. WHAT CAN I DO IN THE STAIRWAYS?  Do not leave any items on the stairs.  Make sure that there are handrails on both sides of the stairs. Fix handrails that are broken or loose. Make sure that handrails are as long as the stairways.  Check any carpeting to make sure that it is firmly attached to the stairs. Fix any carpet that is loose or worn.  Avoid having throw rugs at the top or bottom of stairways, or secure the rugs with carpet tape to prevent them from moving.  Make sure that you have a light switch at the top of the stairs and the bottom of the stairs. If you do not have them, have them installed. WHAT ARE SOME OTHER FALL PREVENTION TIPS?  Wear closed-toe shoes that fit well and support your feet. Wear shoes that have rubber soles or low heels.  When you use a stepladder, make sure that it is completely opened and that the sides are firmly locked. Have someone hold the ladder while you   are using it. Do not climb a closed stepladder.  Add color or contrast paint or tape to grab bars and handrails in your home. Place contrasting color strips on the first and last steps.  Use mobility aids as needed, such as canes, walkers, scooters, and crutches.  Turn on lights if it is dark. Replace any light bulbs that burn out.  Set up furniture so that there are clear paths. Keep the furniture in the same spot.  Fix any uneven floor surfaces.  Choose a carpet design that does not hide the edge of steps of a stairway.  Be aware of any and all pets.  Review your medicines with your healthcare provider. Some medicines can cause dizziness or changes in blood pressure, which increase your risk of falling. Talk  with your health care provider about other ways that you can decrease your risk of falls. This may include working with a physical therapist or trainer to improve your strength, balance, and endurance.   This information is not intended to replace advice given to you by your health care provider. Make sure you discuss any questions you have with your health care provider.   Document Released: 09/15/2002 Document Revised: 02/09/2015 Document Reviewed: 10/30/2014 Elsevier Interactive Patient Education 2016 [employer].  

## 2015-09-09 NOTE — Telephone Encounter (Signed)
Pt's wife called and states that the pt has started to walk differently since Tuesday. When he walks he gets off balance and will start to lean to the left. Pt will tell wife that his wife that his legs hurt. Wife states that he is not sleeping good at night either. She is concerned that he may have had a TIA. Wife states that she has not noticed in slurring in speech or facial drupe. She is mainly concerned with change in walking. Please call and advise [phone]

## 2015-09-09 NOTE — ED Provider Notes (Signed)
CSN: [id number]     Arrival date & time 09/09/15  1141 History   First MD Initiated Contact with Patient 09/09/15 1228     Chief Complaint  Patient presents with  . Gait Problem     (Consider location/radiation/quality/duration/timing/severity/associated sxs/prior Treatment) HPI [name] is a 74 y.o. male with history of hypertension, diabetes, dementia, presents to emergency department with his wife with complaint of gait abnormality. Patient is a poor historian due to his dementia. Wife states that about a week ago patient had an episode of dragging left leg, states that he was also leaning to the left after his walk. Symptoms seemed to resolve up until a few days ago. Wife states that patient intermittently is limping, dragging left leg, and leaning his torso to the left as he walks. Patient denies any pain or difficulty walking. There has not been any falls. Wife states that this is something new for the patient. She denies any history of strokes. She denies any injuries. No fever or chills. No other complaints.  Past Medical History  Diagnosis Date  . Obstructive sleep apnea   . Gout   . Hypertension   . Benign prostatic hypertrophy   . Rosacea, acne   . Elevated PSA   . Nephrolithiasis     hx of  . Erectile dysfunction   . Partial Achilles tendon tear   . Diabetes mellitus type II     no meds  . Memory changes   . Dementia   . Memory difficulties 04/05/2015  . Abnormality of gait 04/05/2015  . Tremor 04/05/2015   Past Surgical History  Procedure Laterality Date  . Carpal tunnel release  2004  . Inguinal hernia repair    . Transurethral resection of prostate    . Tonsillectomy and adenoidectomy    . Hand surgery  2007    right hand  . Knee surgery  1961    left knee  . Joint replacement      left total knee  . Tonsillectomy    . Eye surgery      cataracts  . Carpal tunnel release  10/31/2011    Procedure: CARPAL TUNNEL RELEASE;  Surgeon: Cammie Sickle.,  MD;  Location: [facility];  Service: Orthopedics;  Laterality: Left;   Family History  Problem Relation Age of Onset  . Thalassemia Son   . Colon cancer Neg Hx   . Stomach cancer Neg Hx   . Heart disease Mother   . Dementia Sister   . COPD Sister   . Diabetes Sister   . Heart disease Paternal Grandfather   . Dementia Cousin    Social History  Substance Use Topics  . Smoking status: Never Smoker   . Smokeless tobacco: Never Used  . Alcohol Use: No     Comment: rarely    Review of Systems  Constitutional: Negative for fever and chills.  Respiratory: Negative for cough, chest tightness and shortness of breath.   Cardiovascular: Negative for chest pain, palpitations and leg swelling.  Gastrointestinal: Negative for nausea, vomiting, abdominal pain, diarrhea and abdominal distention.  Musculoskeletal: Negative for myalgias, arthralgias, neck pain and neck stiffness.  Skin: Negative for rash.  Allergic/Immunologic: Negative for immunocompromised state.  Neurological: Positive for weakness. Negative for dizziness, light-headedness, numbness and headaches.  All other systems reviewed and are negative.     Allergies  Ciprofloxacin  Home Medications   Prior to Admission medications   Medication Sig Start Date End  Date Taking? Authorizing Provider  acetaminophen (TYLENOL) 650 MG CR tablet Take 1,300 mg by mouth as needed for pain.    Historical Provider, MD  allopurinol (ZYLOPRIM) 100 MG tablet Take 300 mg by mouth daily.  06/06/11   Lisabeth Pick, MD  amoxicillin (AMOXIL) 500 MG capsule Take 500 mg by mouth. To use before dental appointments    Historical Provider, MD  aspirin 81 MG tablet Take 81 mg by mouth daily.    Historical Provider, MD  Cholecalciferol (VITAMIN D-3 PO) Take 2,000 Units by mouth daily.     Historical Provider, MD  colchicine 0.6 MG tablet Take 0.6 mg by mouth daily.    Historical Provider, MD  Diltiazem HCl ER 360 MG CP24 Take 1 tablet by  mouth daily. 10/16/13   Historical Provider, MD  donepezil (ARICEPT) 10 MG tablet Take 1 tablet (10 mg total) by mouth at bedtime. 04/05/15   Kathrynn Ducking, MD  EPIPEN 2-PAK 0.3 MG/0.3ML SOAJ injection  07/22/14   Historical Provider, MD  finasteride (PROSCAR) 5 MG tablet Take 5 mg by mouth daily.      Historical Provider, MD  lisinopril (PRINIVIL,ZESTRIL) 10 MG tablet Take 1 tablet (10 mg total) by mouth daily. 10/27/14   Sherren Mocha, MD  metroNIDAZOLE (METROGEL) 1 % gel  12/06/13   Historical Provider, MD  modafinil (PROVIGIL) 200 MG tablet Take 200 mg by mouth 2 (two) times daily.    Historical Provider, MD  NON FORMULARY CPAP at [facility] Provider, MD  omeprazole (PRILOSEC) 40 MG capsule Take 1 capsule (40 mg total) by mouth daily. 02/16/15   Irene Shipper, MD  ondansetron (ZOFRAN) 4 MG tablet Take 1 tablet (4 mg total) by mouth every 8 (eight) hours as needed for nausea or vomiting. 02/13/14   Willia Craze, NP  ONETOUCH VERIO test strip  11/17/11   Historical Provider, MD  sertraline (ZOLOFT) 50 MG tablet 1 tablet daily for 2 weeks, then take 2 tablets daily 05/03/15   Kathrynn Ducking, MD   BP 166/85 mmHg  Pulse 72  Temp(Src) 97.7 F (36.5 C) (Oral)  Resp 18  SpO2 100% Physical Exam  Constitutional: He is oriented to person, place, and time. He appears well-developed and well-nourished. No distress.  HENT:  Head: Normocephalic and atraumatic.  Eyes: Conjunctivae and EOM are normal. Pupils are equal, round, and reactive to light.  Neck: Normal range of motion. Neck supple.  Cardiovascular: Normal rate, regular rhythm and normal heart sounds.   Pulmonary/Chest: Effort normal. No respiratory distress. He has no wheezes. He has no rales.  Abdominal: Soft. Bowel sounds are normal. He exhibits no distension. There is no tenderness. There is no rebound.  Musculoskeletal: He exhibits no edema.  Neurological: He is alert and oriented to person, place, and time. No cranial nerve  deficit. Coordination normal.  5/5 and equal upper extremity strength bilaterally. Equal grip strength bilaterally. Slightly decreased left hip flexor strength on left leg compared to right. Normal finger to nose and heel to shin. No pronator drift. Patellar reflexes 2+. Slow shuffling gait.   Skin: Skin is warm and dry.  Psychiatric: He has a normal mood and affect. His behavior is normal.  Nursing note and vitals reviewed.   ED Course  Procedures (including critical care time) Labs Review Labs Reviewed  CBC - Abnormal; Notable for the following:    RBC 6.24 (*)    MCV 65.2 (*)    MCH 22.0 (*)  RDW 16.3 (*)    All other components within normal limits  DIFFERENTIAL - Abnormal; Notable for the following:    Monocytes Absolute 1.3 (*)    All other components within normal limits  COMPREHENSIVE METABOLIC PANEL - Abnormal; Notable for the following:    Glucose, Bld 153 (*)    Creatinine, Ser 1.50 (*)    GFR calc non Af Amer 43 (*)    GFR calc Af Amer 50 (*)    All other components within normal limits  I-STAT CHEM 8, ED - Abnormal; Notable for the following:    BUN 23 (*)    Creatinine, Ser 1.40 (*)    Glucose, Bld 154 (*)    All other components within normal limits  PROTIME-INR  APTT  ETHANOL  URINE RAPID DRUG SCREEN, HOSP PERFORMED  URINALYSIS, ROUTINE W REFLEX MICROSCOPIC (NOT AT [facility])  I-STAT TROPOININ, ED    Imaging Review  I have personally reviewed and evaluated these images and lab results as part of my medical decision-making.   EKG Interpretation None      MDM   Final diagnoses:  Gait abnormality    Patient with history of dementia, poor historian. Presents to emergency department with his wife was concerned about his gait and possibly dragging left foot intermittently for last several days. Patient is in no acute distress, exam is unremarkable, neurologically intact. We ambulated patient, did not see any gait imbalance or any left leg dragging. He  does have a shuffling slow gait. Will get CT head, labs.  2:11 PM CT head and labs with no acute findings to explain patient's symptoms. Will get MRI to rule out stroke.  4:00 PM Pt signed out at shift change pending MRI. If negative OK to dc home. Pt's gait with no significant abnormalities here. No neurological findings.   Jeannett Senior, PA-C 09/10/15 [place], MD 09/11/15 2103

## 2015-09-09 NOTE — Telephone Encounter (Signed)
The patient's wife called back. She stated that on 08-31-15 the patient had an episode of weakness on the left side. She came home and found him dragging his left leg. He states both of his legs and ankles feel "strange." He had another episode of this weakness last night. I discussed this with Dr. Rexene Alberts, who advised he go to the ED. I advised the wife that they should go to the ED to be evaluated for possible stroke or TIA. She verbalized understanding and stated she would get him there.

## 2015-09-14 DIAGNOSIS — L738 Other specified follicular disorders: Secondary | ICD-10-CM | POA: Diagnosis not present

## 2015-09-14 DIAGNOSIS — L711 Rhinophyma: Secondary | ICD-10-CM | POA: Diagnosis not present

## 2015-09-14 DIAGNOSIS — Z85828 Personal history of other malignant neoplasm of skin: Secondary | ICD-10-CM | POA: Diagnosis not present

## 2015-09-16 ENCOUNTER — Encounter: Payer: Self-pay | Admitting: Internal Medicine

## 2015-09-17 DIAGNOSIS — G8310 Monoplegia of lower limb affecting unspecified side: Secondary | ICD-10-CM | POA: Diagnosis not present

## 2015-09-17 DIAGNOSIS — Z6837 Body mass index (BMI) 37.0-37.9, adult: Secondary | ICD-10-CM | POA: Diagnosis not present

## 2015-09-20 ENCOUNTER — Other Ambulatory Visit: Payer: Self-pay | Admitting: Internal Medicine

## 2015-09-20 DIAGNOSIS — R29898 Other symptoms and signs involving the musculoskeletal system: Secondary | ICD-10-CM

## 2015-09-29 ENCOUNTER — Ambulatory Visit
Admission: RE | Admit: 2015-09-29 | Discharge: 2015-09-29 | Disposition: A | Discharge status: Home / Self Care | Payer: Medicare Other | Source: Ambulatory Visit | Attending: Internal Medicine | Admitting: Internal Medicine

## 2015-09-29 DIAGNOSIS — R29898 Other symptoms and signs involving the musculoskeletal system: Secondary | ICD-10-CM

## 2015-09-29 DIAGNOSIS — M4806 Spinal stenosis, lumbar region: Secondary | ICD-10-CM | POA: Diagnosis not present

## 2015-09-30 ENCOUNTER — Other Ambulatory Visit: Payer: Self-pay | Admitting: Internal Medicine

## 2015-10-12 DIAGNOSIS — Z6837 Body mass index (BMI) 37.0-37.9, adult: Secondary | ICD-10-CM | POA: Diagnosis not present

## 2015-10-12 DIAGNOSIS — E119 Type 2 diabetes mellitus without complications: Secondary | ICD-10-CM | POA: Diagnosis not present

## 2015-10-12 DIAGNOSIS — I10 Essential (primary) hypertension: Secondary | ICD-10-CM | POA: Diagnosis not present

## 2015-10-12 DIAGNOSIS — M4806 Spinal stenosis, lumbar region: Secondary | ICD-10-CM | POA: Diagnosis not present

## 2015-10-12 DIAGNOSIS — E1122 Type 2 diabetes mellitus with diabetic chronic kidney disease: Secondary | ICD-10-CM | POA: Diagnosis not present

## 2015-10-12 DIAGNOSIS — I129 Hypertensive chronic kidney disease with stage 1 through stage 4 chronic kidney disease, or unspecified chronic kidney disease: Secondary | ICD-10-CM | POA: Diagnosis not present

## 2015-10-12 DIAGNOSIS — N183 Chronic kidney disease, stage 3 (moderate): Secondary | ICD-10-CM | POA: Diagnosis not present

## 2015-10-14 DIAGNOSIS — M5136 Other intervertebral disc degeneration, lumbar region: Secondary | ICD-10-CM | POA: Diagnosis not present

## 2015-10-14 DIAGNOSIS — M545 Low back pain: Secondary | ICD-10-CM | POA: Diagnosis not present

## 2015-10-14 DIAGNOSIS — M4316 Spondylolisthesis, lumbar region: Secondary | ICD-10-CM | POA: Diagnosis not present

## 2015-10-14 DIAGNOSIS — M549 Dorsalgia, unspecified: Secondary | ICD-10-CM | POA: Diagnosis not present

## 2015-10-14 DIAGNOSIS — M4806 Spinal stenosis, lumbar region: Secondary | ICD-10-CM | POA: Diagnosis not present

## 2015-10-14 DIAGNOSIS — Z6834 Body mass index (BMI) 34.0-34.9, adult: Secondary | ICD-10-CM | POA: Diagnosis not present

## 2015-10-14 DIAGNOSIS — M4726 Other spondylosis with radiculopathy, lumbar region: Secondary | ICD-10-CM | POA: Diagnosis not present

## 2015-10-18 DIAGNOSIS — N200 Calculus of kidney: Secondary | ICD-10-CM | POA: Diagnosis not present

## 2015-10-18 DIAGNOSIS — R351 Nocturia: Secondary | ICD-10-CM | POA: Diagnosis not present

## 2015-10-18 DIAGNOSIS — N401 Enlarged prostate with lower urinary tract symptoms: Secondary | ICD-10-CM | POA: Diagnosis not present

## 2015-10-18 DIAGNOSIS — N183 Chronic kidney disease, stage 3 (moderate): Secondary | ICD-10-CM | POA: Diagnosis not present

## 2015-10-18 DIAGNOSIS — N398 Other specified disorders of urinary system: Secondary | ICD-10-CM | POA: Diagnosis not present

## 2015-10-20 ENCOUNTER — Other Ambulatory Visit: Payer: Self-pay | Admitting: Neurology

## 2015-10-28 DIAGNOSIS — M5136 Other intervertebral disc degeneration, lumbar region: Secondary | ICD-10-CM | POA: Diagnosis not present

## 2015-10-28 DIAGNOSIS — M4316 Spondylolisthesis, lumbar region: Secondary | ICD-10-CM | POA: Diagnosis not present

## 2015-10-28 DIAGNOSIS — M4806 Spinal stenosis, lumbar region: Secondary | ICD-10-CM | POA: Diagnosis not present

## 2015-10-28 DIAGNOSIS — M47816 Spondylosis without myelopathy or radiculopathy, lumbar region: Secondary | ICD-10-CM | POA: Diagnosis not present

## 2015-11-18 DIAGNOSIS — G4733 Obstructive sleep apnea (adult) (pediatric): Secondary | ICD-10-CM | POA: Diagnosis not present

## 2015-11-18 DIAGNOSIS — R269 Unspecified abnormalities of gait and mobility: Secondary | ICD-10-CM | POA: Diagnosis not present

## 2015-11-18 DIAGNOSIS — S0990XS Unspecified injury of head, sequela: Secondary | ICD-10-CM | POA: Diagnosis not present

## 2015-11-18 DIAGNOSIS — R413 Other amnesia: Secondary | ICD-10-CM | POA: Diagnosis not present

## 2015-11-18 DIAGNOSIS — F321 Major depressive disorder, single episode, moderate: Secondary | ICD-10-CM | POA: Diagnosis not present

## 2015-11-18 DIAGNOSIS — F0280 Dementia in other diseases classified elsewhere without behavioral disturbance: Secondary | ICD-10-CM | POA: Diagnosis not present

## 2015-11-18 DIAGNOSIS — G4710 Hypersomnia, unspecified: Secondary | ICD-10-CM | POA: Diagnosis not present

## 2015-11-19 ENCOUNTER — Ambulatory Visit (INDEPENDENT_AMBULATORY_CARE_PROVIDER_SITE_OTHER): Payer: Medicare Other | Admitting: Ophthalmology

## 2015-12-10 ENCOUNTER — Encounter (INDEPENDENT_AMBULATORY_CARE_PROVIDER_SITE_OTHER): Payer: Self-pay

## 2015-12-10 ENCOUNTER — Encounter: Payer: Self-pay | Admitting: Cardiovascular Disease

## 2015-12-10 ENCOUNTER — Ambulatory Visit (INDEPENDENT_AMBULATORY_CARE_PROVIDER_SITE_OTHER): Payer: Medicare Other | Admitting: Cardiovascular Disease

## 2015-12-10 VITALS — BP 164/86 | HR 58 | Ht 75.0 in | Wt 273.4 lb

## 2015-12-10 DIAGNOSIS — I10 Essential (primary) hypertension: Secondary | ICD-10-CM

## 2015-12-10 MED ORDER — LISINOPRIL 20 MG PO TABS: 20.0000 mg | ORAL_TABLET | Freq: Every day | ORAL | Status: DC

## 2015-12-10 NOTE — Patient Instructions (Signed)
Medication Instructions:  Your physician has recommended you make the following change in your medication:  1. INCREASE Lisinopril to 20mg take one tablet by mouth daily  Labwork: No new orders.   Testing/Procedures: No new orders.   Follow-Up: Your physician wants you to follow-up in: 1 YEAR with Dr Cooper.  You will receive a reminder letter in the mail two months in advance. If you don't receive a letter, please call our office to schedule the follow-up appointment.   Any Other Special Instructions Will Be Listed Below (If Applicable).     If you need a refill on your cardiac medications before your next appointment, please call your pharmacy.   

## 2015-12-10 NOTE — Progress Notes (Signed)
Cardiology Office Note Date:  12/10/2015   ID:  [name], DOB 1940-11-12, MRN [id number]  PCP:  Velna Hatchet, MD  Cardiologist:  Sherren Mocha, MD    Chief Complaint  Patient presents with  . Hypertension   History of Present Illness: [name] is a 75 y.o. male who presents for follow-up of hypertension, hyperlipidemia, and history of syncope. His most significant problem in recent years has been related to dementia. He is followed at [facility] for his cognitive impairment. Since his last visit he was seen in the Emergency Room 09/09/2015 for gait abnormality. All testing including CT and MRI showed no acute abnormalities.   Overall he is doing okay from a cardiovascular perspective. He exercises with a trainer. He does have some problems with his gait on a chronic basis. He denies chest pain, chest pressure, or shortness of breath. He denies orthopnea or PND. He's had no heart palpitations, lightheadedness, or syncope.   Past Medical History  Diagnosis Date  . Obstructive sleep apnea   . Gout   . Hypertension   . Benign prostatic hypertrophy   . Rosacea, acne   . Elevated PSA   . Nephrolithiasis     hx of  . Erectile dysfunction   . Partial Achilles tendon tear   . Diabetes mellitus type II     no meds  . Memory changes   . Dementia   . Memory difficulties 04/05/2015  . Abnormality of gait 04/05/2015  . Tremor 04/05/2015    Past Surgical History  Procedure Laterality Date  . Carpal tunnel release  2004  . Inguinal hernia repair    . Transurethral resection of prostate    . Tonsillectomy and adenoidectomy    . Hand surgery  2007    right hand  . Knee surgery  1961    left knee  . Joint replacement      left total knee  . Tonsillectomy    . Eye surgery      cataracts  . Carpal tunnel release  10/31/2011    Procedure: CARPAL TUNNEL RELEASE;  Surgeon: Cammie Sickle., MD;  Location: [facility];  Service: Orthopedics;  Laterality: Left;     Current Outpatient Prescriptions  Medication Sig Dispense Refill  . acetaminophen (TYLENOL) 650 MG CR tablet Take 1,300 mg by mouth as needed for pain.    Marland Kitchen allopurinol (ZYLOPRIM) 100 MG tablet Take 300 mg by mouth daily.     Marland Kitchen allopurinol (ZYLOPRIM) 300 MG tablet Take 300 mg by mouth daily.    Marland Kitchen amoxicillin (AMOXIL) 500 MG capsule Take 500 mg by mouth as directed. To use 1 hour before dental appointments    . aspirin 81 MG tablet Take 81 mg by mouth daily.    . Cholecalciferol (VITAMIN D-3 PO) Take 2,000 Units by mouth daily.     . Diltiazem HCl ER 360 MG CP24 Take 1 tablet by mouth daily.    Marland Kitchen donepezil (ARICEPT) 10 MG tablet Take 10 mg by mouth at bedtime.    Marland Kitchen EPIPEN 2-PAK 0.3 MG/0.3ML SOAJ injection Inject 0.3 mg into the muscle once as needed (severe allergic reaction).   0  . finasteride (PROSCAR) 5 MG tablet Take 5 mg by mouth daily.      Marland Kitchen lisinopril (PRINIVIL,ZESTRIL) 20 MG tablet Take 1 tablet (20 mg total) by mouth daily. 30 tablet 11  . memantine (NAMENDA) 10 MG tablet Take 1 tablet by mouth 2 (two) times  daily.  10  . metroNIDAZOLE (METROGEL) 1 % gel     . modafinil (PROVIGIL) 200 MG tablet Take 100-200 mg by mouth daily.     . NON FORMULARY CPAP at NIGHT    . ondansetron (ZOFRAN) 4 MG tablet Take 1 tablet (4 mg total) by mouth every 8 (eight) hours as needed for nausea or vomiting. 30 tablet 1  . ONETOUCH VERIO test strip     . sertraline (ZOLOFT) 50 MG tablet Take 50 mg by mouth daily.     No current facility-administered medications for this visit.    Allergies:   Ciprofloxacin   Social History:  The patient  reports that he has never smoked. He has never used smokeless tobacco. He reports that he does not drink alcohol or use illicit drugs.   Family History:  The patient's family history includes COPD in his sister; Dementia in his cousin and sister; Diabetes in his sister; Heart disease in his mother and paternal grandfather; Thalassemia in his son. There is no  history of Colon cancer or Stomach cancer.   ROS:  Please see the history of present illness.  Otherwise, review of systems is positive for appetite change, hearing loss, back pain, muscle pain, anxiety, joint swelling, balance problems.  All other systems are reviewed and negative.   PHYSICAL EXAM: VS:  BP 164/86 mmHg  Pulse 58  Ht 6\' 3"  (1.905 m)  Wt 124.013 kg (273 lb 6.4 oz)  BMI 34.17 kg/m2 , BMI Body mass index is 34.17 kg/(m^2). GEN: Well nourished, well developed, pleasant elderly male  in no acute distress HEENT: normal Neck: no JVD, no masses. No carotid bruits Cardiac: RRR without murmur or gallop                Respiratory:  clear to auscultation bilaterally, normal work of breathing GI: soft, nontender, nondistended, + BS MS: no deformity or atrophy Ext: no pretibial edema, pedal pulses 2+= bilaterally Skin: warm and dry, no rash Neuro:  Strength and sensation are intact Psych: euthymic mood, full affect  EKG:  EKG is ordered today. 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, LAFB  Recent Labs: 09/09/2015: ALT 28; BUN 23*; Creatinine, Ser 1.40*; Hemoglobin 15.3; Platelets 195; Potassium 4.1; Sodium 140   Lipid Panel     Component Value Date/Time   CHOL 151 11/11/2013 0730   TRIG 112.0 11/11/2013 0730   TRIG 159* 08/21/2006 1529   HDL 33.00* 11/11/2013 0730   CHOLHDL 5 11/11/2013 0730   CHOLHDL 6.8 CALC 08/21/2006 1529   VLDL 22.4 11/11/2013 0730   LDLCALC 96 11/11/2013 0730   LDLDIRECT 97.8 05/26/2009 1131      Wt Readings from Last 3 Encounters:  12/10/15 124.013 kg (273 lb 6.4 oz)  07/28/15 124.286 kg (274 lb)  04/05/15 125.828 kg (277 lb 6.4 oz)    Cardiac Studies Reviewed: Myocardial Perfusion Study 10/31/2012: QPS Raw Data Images: Acquisition technically good; LVE. Stress Images: There is decreased uptake in the apex. Rest Images: There is decreased uptake in the apex. Subtraction (SDS): No evidence of  ischemia. Transient Ischemic Dilatation (Normal <1.22): 1.03 Lung/Heart Ratio (Normal <0.45): 0.30  Quantitative Gated Spect Images QGS EDV: 150 ml QGS ESV: 58 ml  Impression Exercise Capacity: Fair exercise capacity. BP Response: Hypertensive blood pressure response. Clinical Symptoms: There is dyspnea. ECG Impression: No significant ST segment change suggestive of ischemia. Comparison with Prior Nuclear Study: No previous nuclear study performed  Overall Impression:  Normal stress nuclear study with a small, mild, fixed apical defect consistent with thinning; no ischemia.  LV Ejection Fraction: 62%. LV Wall Motion: NL LV Function; NL Wall Motion  Kirk Ruths  ASSESSMENT AND PLAN: 1.  Essential hypertension, uncontrolled: Reviewed medications. Discussed low-sodium diet. Recommend increase lisinopril to 20 mg daily. He continues on diltiazem as well.  2. Hyperlipidemia: Lifestyle modification reviewed.  Current medicines are reviewed with the patient today.  The patient does not have concerns regarding medicines.  Labs/ tests ordered today include:   Orders Placed This Encounter  Procedures  . EKG 12-Lead   Disposition:   FU one year  Signed, Sherren Mocha, MD  12/10/2015 5:51 PM    [facility] [place], [place], [place]  [address] Phone: [phone]; Fax: [phone]

## 2015-12-13 ENCOUNTER — Ambulatory Visit (INDEPENDENT_AMBULATORY_CARE_PROVIDER_SITE_OTHER): Payer: Medicare Other | Admitting: Ophthalmology

## 2015-12-13 DIAGNOSIS — H35033 Hypertensive retinopathy, bilateral: Secondary | ICD-10-CM

## 2015-12-13 DIAGNOSIS — E113293 Type 2 diabetes mellitus with mild nonproliferative diabetic retinopathy without macular edema, bilateral: Secondary | ICD-10-CM

## 2015-12-13 DIAGNOSIS — G4733 Obstructive sleep apnea (adult) (pediatric): Secondary | ICD-10-CM | POA: Diagnosis not present

## 2015-12-13 DIAGNOSIS — Z6836 Body mass index (BMI) 36.0-36.9, adult: Secondary | ICD-10-CM | POA: Diagnosis not present

## 2015-12-13 DIAGNOSIS — M4806 Spinal stenosis, lumbar region: Secondary | ICD-10-CM | POA: Diagnosis not present

## 2015-12-13 DIAGNOSIS — D3132 Benign neoplasm of left choroid: Secondary | ICD-10-CM | POA: Diagnosis not present

## 2015-12-13 DIAGNOSIS — F0390 Unspecified dementia without behavioral disturbance: Secondary | ICD-10-CM | POA: Diagnosis not present

## 2015-12-13 DIAGNOSIS — H43813 Vitreous degeneration, bilateral: Secondary | ICD-10-CM | POA: Diagnosis not present

## 2015-12-13 DIAGNOSIS — I10 Essential (primary) hypertension: Secondary | ICD-10-CM | POA: Diagnosis not present

## 2015-12-13 DIAGNOSIS — N183 Chronic kidney disease, stage 3 (moderate): Secondary | ICD-10-CM | POA: Diagnosis not present

## 2015-12-13 DIAGNOSIS — E11319 Type 2 diabetes mellitus with unspecified diabetic retinopathy without macular edema: Secondary | ICD-10-CM | POA: Diagnosis not present

## 2015-12-13 DIAGNOSIS — E1122 Type 2 diabetes mellitus with diabetic chronic kidney disease: Secondary | ICD-10-CM | POA: Diagnosis not present

## 2015-12-13 LAB — HM DIABETES EYE EXAM

## 2015-12-18 ENCOUNTER — Other Ambulatory Visit: Payer: Self-pay | Admitting: Neurology

## 2015-12-20 DIAGNOSIS — N200 Calculus of kidney: Secondary | ICD-10-CM | POA: Diagnosis not present

## 2015-12-20 DIAGNOSIS — N183 Chronic kidney disease, stage 3 (moderate): Secondary | ICD-10-CM | POA: Diagnosis not present

## 2015-12-20 DIAGNOSIS — I129 Hypertensive chronic kidney disease with stage 1 through stage 4 chronic kidney disease, or unspecified chronic kidney disease: Secondary | ICD-10-CM | POA: Diagnosis not present

## 2015-12-24 ENCOUNTER — Other Ambulatory Visit: Payer: Self-pay | Admitting: Neurology

## 2016-01-10 ENCOUNTER — Ambulatory Visit (INDEPENDENT_AMBULATORY_CARE_PROVIDER_SITE_OTHER): Payer: Medicare Other | Admitting: Podiatry

## 2016-01-10 ENCOUNTER — Encounter: Payer: Self-pay | Admitting: Podiatry

## 2016-01-10 ENCOUNTER — Ambulatory Visit (INDEPENDENT_AMBULATORY_CARE_PROVIDER_SITE_OTHER): Payer: Medicare Other

## 2016-01-10 DIAGNOSIS — M79671 Pain in right foot: Secondary | ICD-10-CM | POA: Diagnosis not present

## 2016-01-10 DIAGNOSIS — B351 Tinea unguium: Secondary | ICD-10-CM | POA: Diagnosis not present

## 2016-01-10 DIAGNOSIS — M205X1 Other deformities of toe(s) (acquired), right foot: Secondary | ICD-10-CM

## 2016-01-11 DIAGNOSIS — M4806 Spinal stenosis, lumbar region: Secondary | ICD-10-CM | POA: Diagnosis not present

## 2016-01-11 DIAGNOSIS — I10 Essential (primary) hypertension: Secondary | ICD-10-CM | POA: Diagnosis not present

## 2016-01-11 DIAGNOSIS — M4316 Spondylolisthesis, lumbar region: Secondary | ICD-10-CM | POA: Diagnosis not present

## 2016-01-11 DIAGNOSIS — M4726 Other spondylosis with radiculopathy, lumbar region: Secondary | ICD-10-CM | POA: Diagnosis not present

## 2016-01-11 DIAGNOSIS — M5136 Other intervertebral disc degeneration, lumbar region: Secondary | ICD-10-CM | POA: Diagnosis not present

## 2016-01-11 DIAGNOSIS — Z6835 Body mass index (BMI) 35.0-35.9, adult: Secondary | ICD-10-CM | POA: Diagnosis not present

## 2016-01-12 NOTE — Progress Notes (Signed)
Subjective:     Patient ID: [name], male   DOB: [birth year], 75 y.o.   MRN: [id number]  HPI patient presents with thick nail disease of the left big toenail and also a bunion deformity right which at times can be tender and is red   Review of Systems     Objective:   Physical Exam Neurovascular status unchanged from previous visit with muscle strength mildly diminished and range of motion diminished subtalar midtarsal joint. Patient's found to have a hyperostosis on the medial dorsal aspect first metatarsal head right that's red and is found to have a severely thickened hallux nail left that does get incurvated but is currently nontender    Assessment:     Structural bunion deformity right mild in nature along matched hallux nail left with possible fungal component    Plan:     Reviewed conditions and since the bunion is only at occasionally tender of recommended wider shoes soft leather and soaks. For the left we utilize topical medicine but it will always be a problem and if it starts to become painful will require removal. Educated the patient and family today

## 2016-01-15 ENCOUNTER — Other Ambulatory Visit: Payer: Self-pay | Admitting: Neurology

## 2016-01-27 ENCOUNTER — Encounter: Payer: Self-pay | Admitting: Neurology

## 2016-01-27 ENCOUNTER — Ambulatory Visit (INDEPENDENT_AMBULATORY_CARE_PROVIDER_SITE_OTHER): Payer: Medicare Other | Admitting: Neurology

## 2016-01-27 VITALS — BP 152/80 | HR 60 | Ht 75.0 in | Wt 281.0 lb

## 2016-01-27 DIAGNOSIS — R413 Other amnesia: Secondary | ICD-10-CM | POA: Diagnosis not present

## 2016-01-27 DIAGNOSIS — R269 Unspecified abnormalities of gait and mobility: Secondary | ICD-10-CM | POA: Diagnosis not present

## 2016-01-27 MED ORDER — SERTRALINE HCL 50 MG PO TABS: ORAL_TABLET | ORAL | Status: DC

## 2016-01-27 NOTE — Progress Notes (Signed)
Reason for visit: Memory disturbance  [name] is an 75 y.o. male  History of present illness:  [name] is a 75 year old right-handed white male with a history of a progressive memory disturbance and a gait disturbance. The patient has been worked up for normal pressure hydrocephalus, but he was not felt to be a candidate for this. He is followed some through [facility], he has had Namenda added to his regimen recently. The patient denies any falls, he does not use a cane or walker for ambulation. The patient has had some constipation issues, he denies any significant bladder control problems. The patient is on Aricept as well, he tolerates the medication fairly well. At times, he may have some difficulty with sleeping. He returns to this office for an evaluation.  Past Medical History  Diagnosis Date  . Obstructive sleep apnea   . Gout   . Hypertension   . Benign prostatic hypertrophy   . Rosacea, acne   . Elevated PSA   . Nephrolithiasis     hx of  . Erectile dysfunction   . Partial Achilles tendon tear   . Diabetes mellitus type II     no meds  . Memory changes   . Dementia   . Memory difficulties 04/05/2015  . Abnormality of gait 04/05/2015  . Tremor 04/05/2015    Past Surgical History  Procedure Laterality Date  . Carpal tunnel release  2004  . Inguinal hernia repair    . Transurethral resection of prostate    . Tonsillectomy and adenoidectomy    . Hand surgery  2007    right hand  . Knee surgery  1961    left knee  . Joint replacement      left total knee  . Tonsillectomy    . Eye surgery      cataracts  . Carpal tunnel release  10/31/2011    Procedure: CARPAL TUNNEL RELEASE;  Surgeon: Cammie Sickle., MD;  Location: [facility];  Service: Orthopedics;  Laterality: Left;    Family History  Problem Relation Age of Onset  . Thalassemia Son   . Colon cancer Neg Hx   . Stomach cancer Neg Hx   . Heart disease Mother   . Dementia  Sister   . COPD Sister   . Diabetes Sister   . Heart disease Paternal Grandfather   . Dementia Cousin     Social history:  reports that he has never smoked. He has never used smokeless tobacco. He reports that he does not drink alcohol or use illicit drugs.    Allergies  Allergen Reactions  . Ciprofloxacin     REACTION: Diarrhea,l rectal irritation    Medications:  Prior to Admission medications   Medication Sig Start Date End Date Taking? Authorizing Provider  acetaminophen (TYLENOL) 650 MG CR tablet Take 1,300 mg by mouth as needed for pain.    Historical Provider, MD  allopurinol (ZYLOPRIM) 100 MG tablet Take 300 mg by mouth daily.  06/06/11   Lisabeth Pick, MD  allopurinol (ZYLOPRIM) 300 MG tablet Take 300 mg by mouth daily.    Historical Provider, MD  amoxicillin (AMOXIL) 500 MG capsule Take 500 mg by mouth as directed. To use 1 hour before dental appointments    Historical Provider, MD  aspirin 81 MG tablet Take 81 mg by mouth daily.    Historical Provider, MD  Cholecalciferol (VITAMIN D-3 PO) Take 2,000 Units by mouth daily.  Historical Provider, MD  Diltiazem HCl ER 360 MG CP24 Take 1 tablet by mouth daily. 10/16/13   Historical Provider, MD  donepezil (ARICEPT) 10 MG tablet Take 10 mg by mouth at bedtime.    Historical Provider, MD  donepezil (ARICEPT) 10 MG tablet TAKE ONE TABLET AT BEDTIME 12/20/15   Kathrynn Ducking, MD  donepezil (ARICEPT) 10 MG tablet TAKE ONE TABLET AT BEDTIME 12/27/15   Kathrynn Ducking, MD  EPIPEN 2-PAK 0.3 MG/0.3ML SOAJ injection Inject 0.3 mg into the muscle once as needed (severe allergic reaction).  07/22/14   Historical Provider, MD  finasteride (PROSCAR) 5 MG tablet Take 5 mg by mouth daily.      Historical Provider, MD  lisinopril (PRINIVIL,ZESTRIL) 20 MG tablet Take 1 tablet (20 mg total) by mouth daily. 12/10/15   Sherren Mocha, MD  memantine (NAMENDA) 10 MG tablet Take 1 tablet by mouth 2 (two) times daily. 12/02/15   Historical Provider, MD    metroNIDAZOLE (METROGEL) 1 % gel  12/06/13   Historical Provider, MD  modafinil (PROVIGIL) 200 MG tablet Take 100-200 mg by mouth daily.     Historical Provider, MD  NON FORMULARY CPAP at [place] Provider, MD  ondansetron (ZOFRAN) 4 MG tablet Take 1 tablet (4 mg total) by mouth every 8 (eight) hours as needed for nausea or vomiting. 02/13/14   Willia Craze, NP  ONETOUCH VERIO test strip  11/17/11   Historical Provider, MD  sertraline (ZOLOFT) 50 MG tablet TAKE ONE TABLET EACH DAY FOR 2 WEEKS THEN TAKE TWO TABLETS EVERY DAY 01/19/16   Kathrynn Ducking, MD    ROS:  Out of a complete 14 system review of symptoms, the patient complains only of the following symptoms, and all other reviewed systems are negative.  Eye discharge Leg swelling Insomnia, sleep apnea, daytime sleepiness Back pain, walking difficulty, neck pain Memory loss  Blood pressure 152/80, pulse 60, height 6\' 3"  (1.905 m), weight 281 lb (127.461 kg).  Physical Exam  General: The patient is alert and cooperative at the time of the examination. The patient is moderately obese.  Skin: 3+ edema below the knees is noted bilaterally.   Neurologic Exam  Mental status: The patient is alert and oriented x 2 at the time of the examination (not oriented to date). The Mini-Mental Status Examination done today shows a total score of 19/30. The patient is able to name 8 animals in 30 seconds.   Cranial nerves: Facial symmetry is present. Speech is normal, no aphasia or dysarthria is noted. Extraocular movements are full. Visual fields are full.  Motor: The patient has good strength in all 4 extremities.  Sensory examination: Soft touch sensation is symmetric on the face, arms, and legs.  Coordination: The patient has good finger-nose-finger and heel-to-shin bilaterally. Some apraxia with the use of the lower extremities is noted.  Gait and station: The patient has a wide-based, unsteady gait. Tandem gait is unsteady.  Romberg is negative. No drift is seen.  Reflexes: Deep tendon reflexes are symmetric, but are slightly depressed.   MRI brain 09/09/15:  IMPRESSION: No acute intracranial abnormality. Stable noncontrast MRI appearance of the brain since 2013.   * MRI scan images were reviewed online. I agree with the written report.    Assessment/Plan:  1. Memory disturbance  2. Gait disturbance  I have recommended the use of a cane, a prescription was given. The patient continues to work out on a regular basis and  work on balance issues which has likely been helpful for him. The patient will remain on Aricept and Namenda. He will follow-up in 6 months. A prescription was called in for the Zoloft.  Jill Alexanders MD 01/27/2016 4:52 PM  [facility] [address] [place], [place] [address]  Phone [phone] Fax [phone]

## 2016-01-27 NOTE — Patient Instructions (Signed)
Fall Prevention in the Home  Falls can cause injuries and can affect people from all age groups. There are many simple things that you can do to make your home safe and to help prevent falls. WHAT CAN I DO ON THE OUTSIDE OF MY HOME?  Regularly repair the edges of walkways and driveways and fix any cracks.  Remove high doorway thresholds.  Trim any shrubbery on the main path into your home.  Use bright outdoor lighting.  Clear walkways of debris and clutter, including tools and rocks.  Regularly check that handrails are securely fastened and in good repair. Both sides of any steps should have handrails.  Install guardrails along the edges of any raised decks or porches.  Have leaves, snow, and ice cleared regularly.  Use sand or salt on walkways during winter months.  In the garage, clean up any spills right away, including grease or oil spills. WHAT CAN I DO IN THE BATHROOM?  Use night lights.  Install grab bars by the toilet and in the tub and shower. Do not use towel bars as grab bars.  Use non-skid mats or decals on the floor of the tub or shower.  If you need to sit down while you are in the shower, use a plastic, non-slip stool..  Keep the floor dry. Immediately clean up any water that spills on the floor.  Remove soap buildup in the tub or shower on a regular basis.  Attach bath mats securely with double-sided non-slip rug tape.  Remove throw rugs and other tripping hazards from the floor. WHAT CAN I DO IN THE BEDROOM?  Use night lights.  Make sure that a bedside light is easy to reach.  Do not use oversized bedding that drapes onto the floor.  Have a firm chair that has side arms to use for getting dressed.  Remove throw rugs and other tripping hazards from the floor. WHAT CAN I DO IN THE KITCHEN?   Clean up any spills right away.  Avoid walking on wet floors.  Place frequently used items in easy-to-reach places.  If you need to reach for something  above you, use a sturdy step stool that has a grab bar.  Keep electrical cables out of the way.  Do not use floor polish or wax that makes floors slippery. If you have to use wax, make sure that it is non-skid floor wax.  Remove throw rugs and other tripping hazards from the floor. WHAT CAN I DO IN THE STAIRWAYS?  Do not leave any items on the stairs.  Make sure that there are handrails on both sides of the stairs. Fix handrails that are broken or loose. Make sure that handrails are as long as the stairways.  Check any carpeting to make sure that it is firmly attached to the stairs. Fix any carpet that is loose or worn.  Avoid having throw rugs at the top or bottom of stairways, or secure the rugs with carpet tape to prevent them from moving.  Make sure that you have a light switch at the top of the stairs and the bottom of the stairs. If you do not have them, have them installed. WHAT ARE SOME OTHER FALL PREVENTION TIPS?  Wear closed-toe shoes that fit well and support your feet. Wear shoes that have rubber soles or low heels.  When you use a stepladder, make sure that it is completely opened and that the sides are firmly locked. Have someone hold the ladder while you   are using it. Do not climb a closed stepladder.  Add color or contrast paint or tape to grab bars and handrails in your home. Place contrasting color strips on the first and last steps.  Use mobility aids as needed, such as canes, walkers, scooters, and crutches.  Turn on lights if it is dark. Replace any light bulbs that burn out.  Set up furniture so that there are clear paths. Keep the furniture in the same spot.  Fix any uneven floor surfaces.  Choose a carpet design that does not hide the edge of steps of a stairway.  Be aware of any and all pets.  Review your medicines with your healthcare provider. Some medicines can cause dizziness or changes in blood pressure, which increase your risk of falling. Talk  with your health care provider about other ways that you can decrease your risk of falls. This may include working with a physical therapist or trainer to improve your strength, balance, and endurance.   This information is not intended to replace advice given to you by your health care provider. Make sure you discuss any questions you have with your health care provider.   Document Released: 09/15/2002 Document Revised: 02/09/2015 Document Reviewed: 10/30/2014 Elsevier Interactive Patient Education 2016 [employer]  

## 2016-02-17 DIAGNOSIS — E1129 Type 2 diabetes mellitus with other diabetic kidney complication: Secondary | ICD-10-CM | POA: Diagnosis not present

## 2016-03-07 DIAGNOSIS — M25511 Pain in right shoulder: Secondary | ICD-10-CM | POA: Diagnosis not present

## 2016-03-07 DIAGNOSIS — G8929 Other chronic pain: Secondary | ICD-10-CM | POA: Diagnosis not present

## 2016-03-07 DIAGNOSIS — G2589 Other specified extrapyramidal and movement disorders: Secondary | ICD-10-CM | POA: Diagnosis not present

## 2016-03-07 DIAGNOSIS — M24111 Other articular cartilage disorders, right shoulder: Secondary | ICD-10-CM | POA: Diagnosis not present

## 2016-03-13 IMAGING — CT CT ABD-PELV W/ CM
2 of 5 series · 16 of 46 positions shown, 18 images · IV contrast (Omnipaque 300)
Comparison: Multiple exams, including 11/21/2010

CLINICAL DATA: Nausea and vomiting.  Epigastric pain.

EXAM:
CT ABDOMEN AND PELVIS WITH CONTRAST
TECHNIQUE: Multidetector CT imaging of the abdomen and pelvis was performed
using the standard protocol following bolus administration of
intravenous contrast.
CONTRAST:  100mL OMNIPAQUE IOHEXOL 300 MG/ML  SOLN

[Series 2: abd/ pel 5mm · axial · 0.80mm/px · z∈[-538,-84]mm · 13 of 103 slices shown, 15 images]
[im 6/103  soft-tissue]
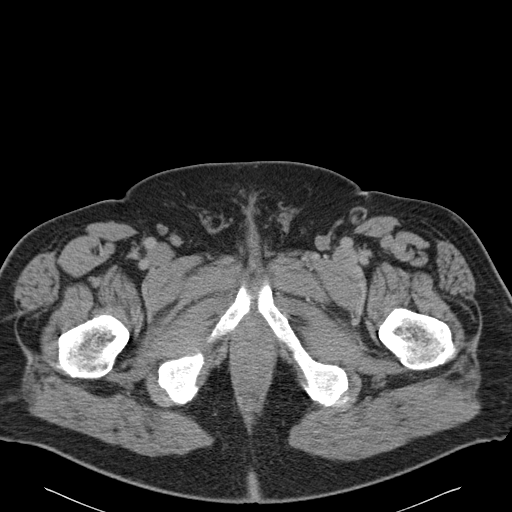
[im 6/103  bone]
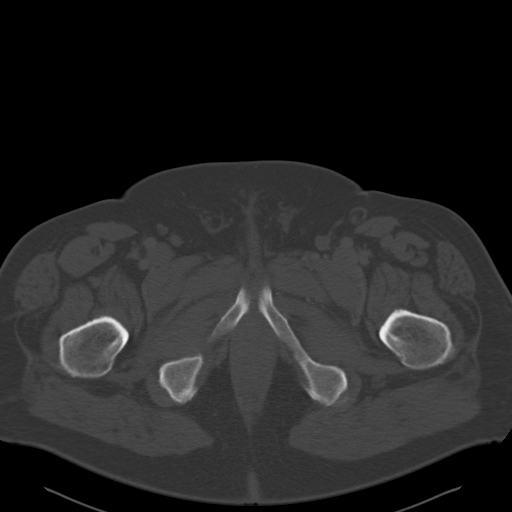
[im 16/103  soft-tissue]
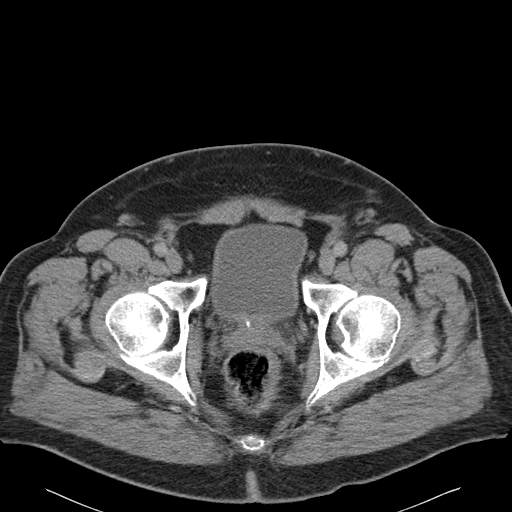
[im 21/103  soft-tissue]
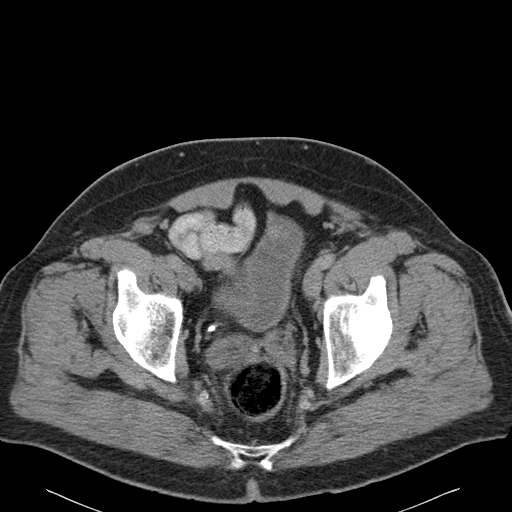
[im 31/103  soft-tissue]
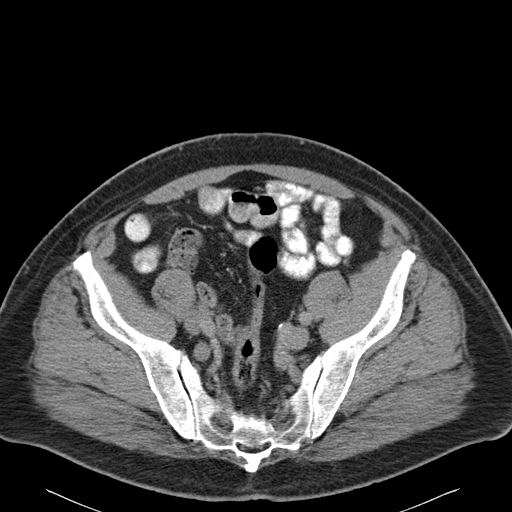
[im 36/103  soft-tissue]
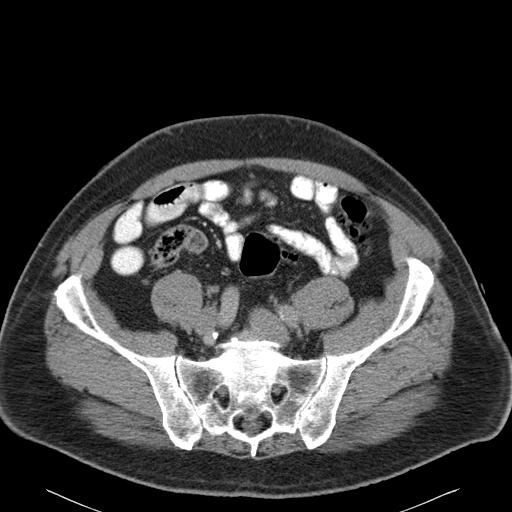
[im 46/103  soft-tissue]
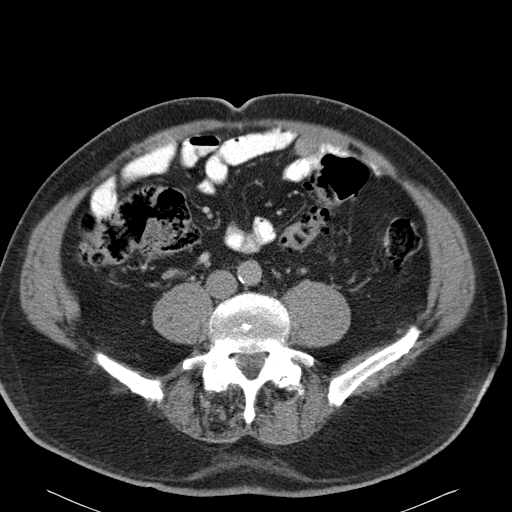
[im 52/103  soft-tissue]
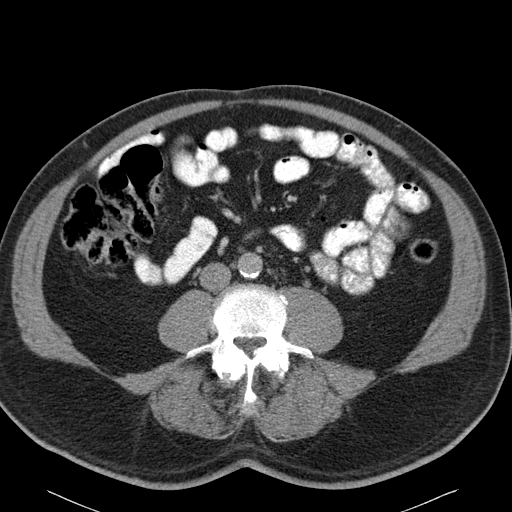
[im 57/103  soft-tissue]
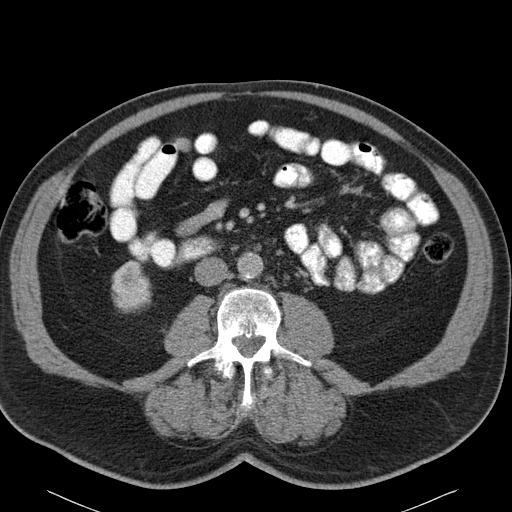
[im 67/103  soft-tissue]
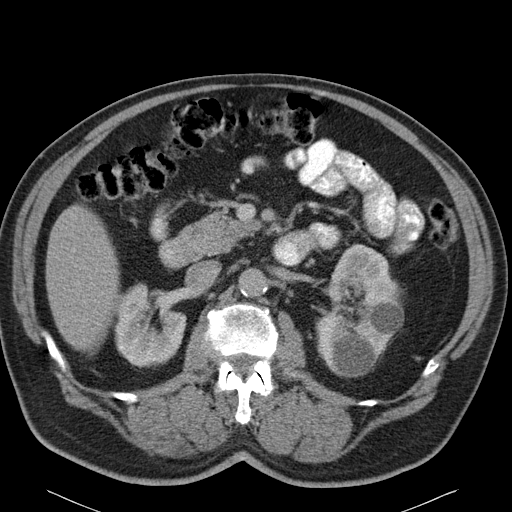
[im 67/103  bone]
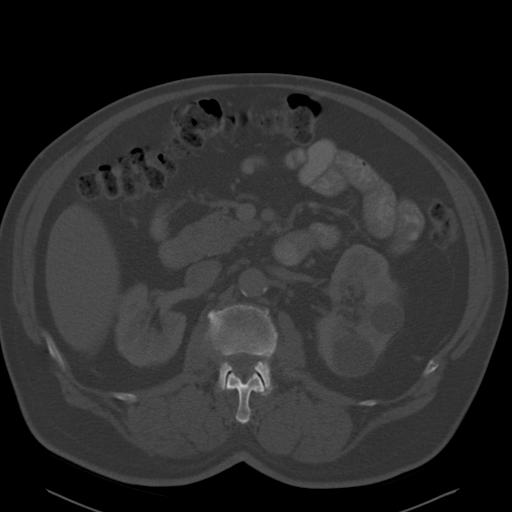
[im 72/103  soft-tissue]
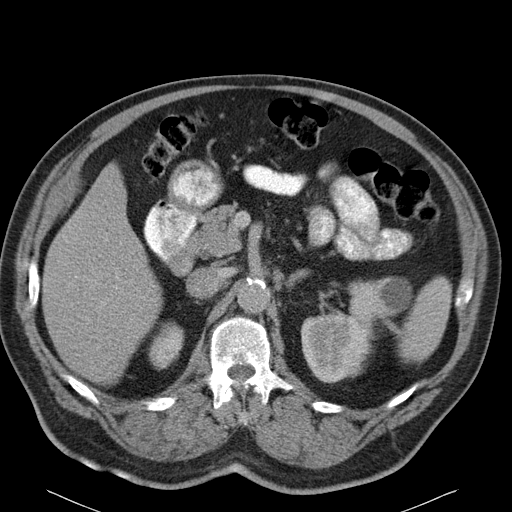
[im 82/103  soft-tissue]
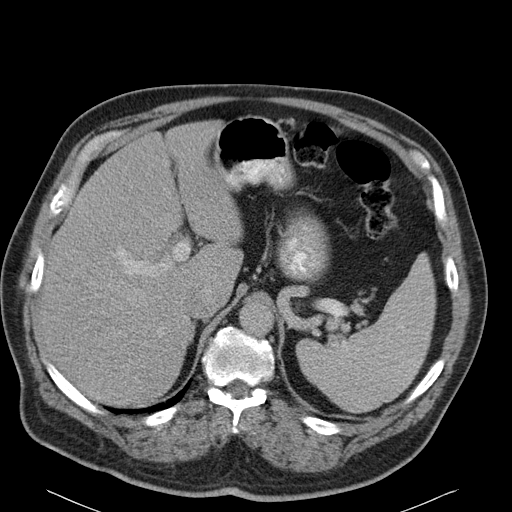
[im 87/103  soft-tissue]
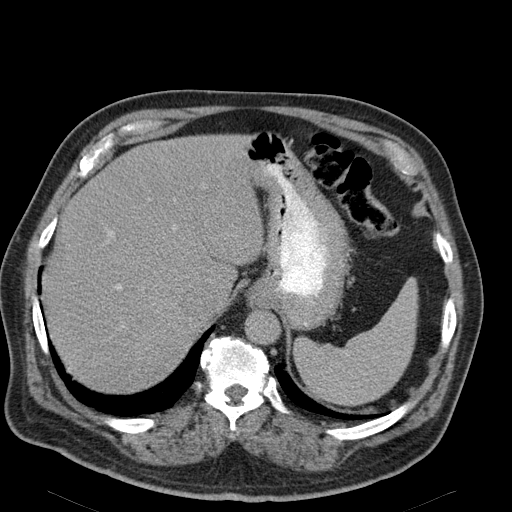
[im 97/103  soft-tissue]
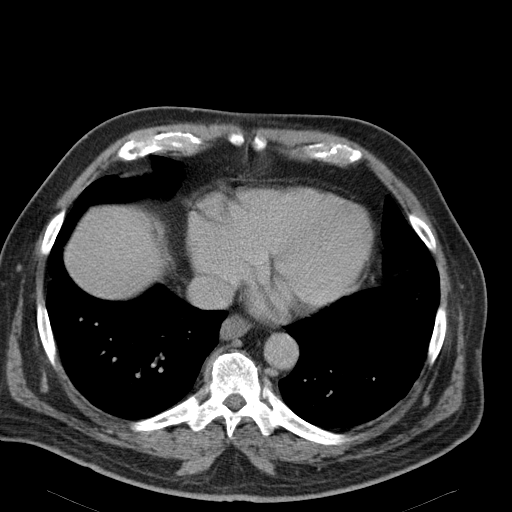

[Series 602: cor · coronal · 1.03mm/px · 3 of 140 slices shown]
[im 47/140  soft-tissue]
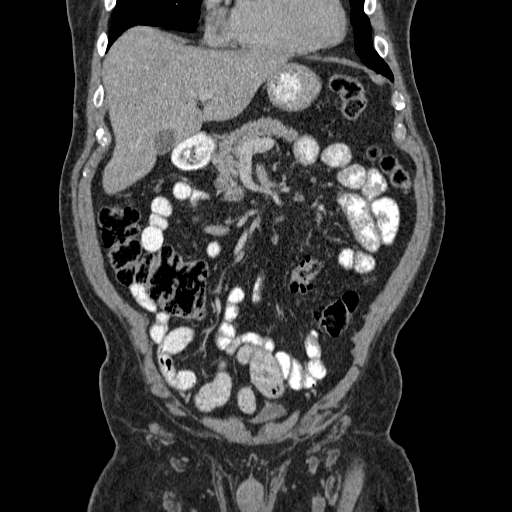
[im 62/140  soft-tissue]
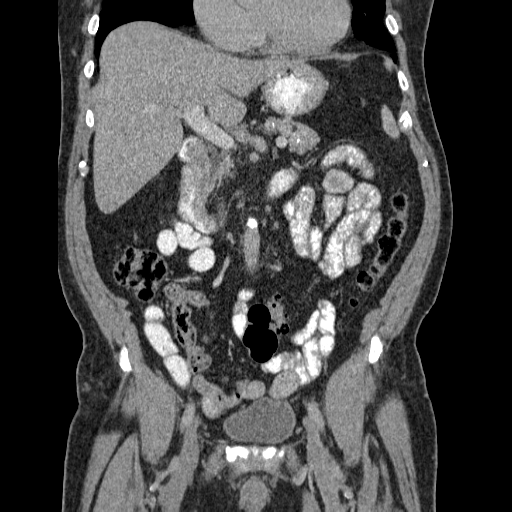
[im 78/140  soft-tissue]
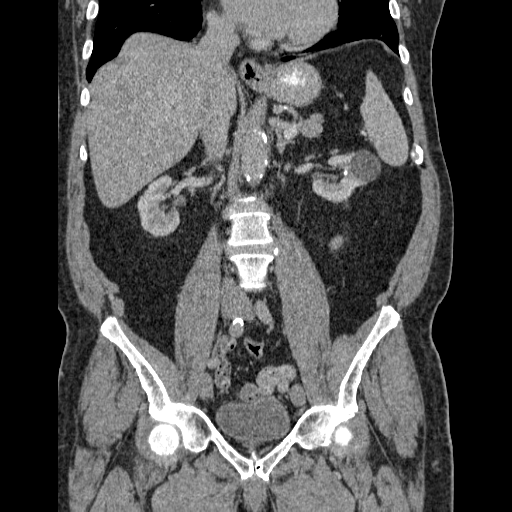

[16 of 46 positions shown; findings below may reference images not displayed]

FINDINGS: Lower chest:  Mild cardiomegaly.

Hepatobiliary: Small gallstones in the gallbladder. No biliary
dilatation.

Pancreas: Intact

Spleen: Intact

Adrenals/Urinary Tract: Multiple Bosniak category 1 cysts of the
left mid kidney have enlarged over the past 9.5 years, but have
benign imaging characteristics.

Multiple left-sided renal calculi are again identified, the largest
measuring 1.8 cm in long axis diameter. No left ureteral calculi.

Nonobstructive right renal CT calculi are again noted. Although
there is no right hydronephrosis or right hydroureter, there is a
0.8 cm right distal ureteral calculus.

Stomach/Bowel: Small periampullary duodenal diverticulum. Sigmoid
diverticulosis. No active diverticulitis observed.

Vascular/Lymphatic: Aortoiliac atherosclerotic vascular disease.
Chronically ectatic celiac trunk.

Reproductive: Punctate calcifications in the prostate gland which
does not appear enlarged.

Other: No supplemental non-categorized findings.

Musculoskeletal: Lumbar degenerative facet arthropathy especially on
the right with suspected right foraminal stenosis at L3-4, L4-5, and
possibly L2-3, and potential left foraminal stenosis at the same
levels. Grade 1 anterolisthesis at L4-5 and at L5-S1.
IMPRESSION: 1. Bilateral nonobstructive renal collecting system calculi, the
largest on the left measuring 1.8 cm in long axis.
2. There is a nonobstructive 8 mm right distal ureteral calculus
near the urinary bladder.
3. Cholelithiasis.  Small periampullary duodenal diverticulum.
4. Sigmoid diverticulosis.
5. Lower lumbar spondylosis and degenerative disc disease believed
causes multilevel impingement.
6. Left renal cysts have enlarged over the past 9.5 years, but have
benign imaging characteristics.
7. Mild cardiomegaly.

## 2016-03-14 DIAGNOSIS — Z85828 Personal history of other malignant neoplasm of skin: Secondary | ICD-10-CM | POA: Diagnosis not present

## 2016-03-14 DIAGNOSIS — E784 Other hyperlipidemia: Secondary | ICD-10-CM | POA: Diagnosis not present

## 2016-03-14 DIAGNOSIS — L821 Other seborrheic keratosis: Secondary | ICD-10-CM | POA: Diagnosis not present

## 2016-03-14 DIAGNOSIS — C44319 Basal cell carcinoma of skin of other parts of face: Secondary | ICD-10-CM | POA: Diagnosis not present

## 2016-03-14 DIAGNOSIS — M109 Gout, unspecified: Secondary | ICD-10-CM | POA: Diagnosis not present

## 2016-03-14 DIAGNOSIS — I10 Essential (primary) hypertension: Secondary | ICD-10-CM | POA: Diagnosis not present

## 2016-03-14 DIAGNOSIS — D485 Neoplasm of uncertain behavior of skin: Secondary | ICD-10-CM | POA: Diagnosis not present

## 2016-03-14 DIAGNOSIS — R8290 Unspecified abnormal findings in urine: Secondary | ICD-10-CM | POA: Diagnosis not present

## 2016-03-14 DIAGNOSIS — E1122 Type 2 diabetes mellitus with diabetic chronic kidney disease: Secondary | ICD-10-CM | POA: Diagnosis not present

## 2016-03-14 DIAGNOSIS — Z125 Encounter for screening for malignant neoplasm of prostate: Secondary | ICD-10-CM | POA: Diagnosis not present

## 2016-03-21 DIAGNOSIS — F419 Anxiety disorder, unspecified: Secondary | ICD-10-CM | POA: Diagnosis not present

## 2016-03-21 DIAGNOSIS — R413 Other amnesia: Secondary | ICD-10-CM | POA: Diagnosis not present

## 2016-03-21 DIAGNOSIS — E784 Other hyperlipidemia: Secondary | ICD-10-CM | POA: Diagnosis not present

## 2016-03-21 DIAGNOSIS — I10 Essential (primary) hypertension: Secondary | ICD-10-CM | POA: Diagnosis not present

## 2016-03-21 DIAGNOSIS — Z6836 Body mass index (BMI) 36.0-36.9, adult: Secondary | ICD-10-CM | POA: Diagnosis not present

## 2016-03-21 DIAGNOSIS — E559 Vitamin D deficiency, unspecified: Secondary | ICD-10-CM | POA: Diagnosis not present

## 2016-03-21 DIAGNOSIS — R3129 Other microscopic hematuria: Secondary | ICD-10-CM | POA: Diagnosis not present

## 2016-03-21 DIAGNOSIS — Z1389 Encounter for screening for other disorder: Secondary | ICD-10-CM | POA: Diagnosis not present

## 2016-03-21 DIAGNOSIS — R2681 Unsteadiness on feet: Secondary | ICD-10-CM | POA: Diagnosis not present

## 2016-03-21 DIAGNOSIS — E119 Type 2 diabetes mellitus without complications: Secondary | ICD-10-CM | POA: Diagnosis not present

## 2016-03-21 DIAGNOSIS — M4806 Spinal stenosis, lumbar region: Secondary | ICD-10-CM | POA: Diagnosis not present

## 2016-03-21 DIAGNOSIS — E1122 Type 2 diabetes mellitus with diabetic chronic kidney disease: Secondary | ICD-10-CM | POA: Diagnosis not present

## 2016-03-22 DIAGNOSIS — M25511 Pain in right shoulder: Secondary | ICD-10-CM | POA: Diagnosis not present

## 2016-04-07 DIAGNOSIS — M25511 Pain in right shoulder: Secondary | ICD-10-CM | POA: Diagnosis not present

## 2016-04-14 DIAGNOSIS — M25511 Pain in right shoulder: Secondary | ICD-10-CM | POA: Diagnosis not present

## 2016-04-21 DIAGNOSIS — M25511 Pain in right shoulder: Secondary | ICD-10-CM | POA: Diagnosis not present

## 2016-04-26 DIAGNOSIS — M25511 Pain in right shoulder: Secondary | ICD-10-CM | POA: Diagnosis not present

## 2016-04-28 DIAGNOSIS — M25511 Pain in right shoulder: Secondary | ICD-10-CM | POA: Diagnosis not present

## 2016-05-03 DIAGNOSIS — M25511 Pain in right shoulder: Secondary | ICD-10-CM | POA: Diagnosis not present

## 2016-05-05 DIAGNOSIS — M25511 Pain in right shoulder: Secondary | ICD-10-CM | POA: Diagnosis not present

## 2016-05-10 DIAGNOSIS — M25511 Pain in right shoulder: Secondary | ICD-10-CM | POA: Diagnosis not present

## 2016-06-16 DIAGNOSIS — R300 Dysuria: Secondary | ICD-10-CM | POA: Diagnosis not present

## 2016-07-04 DIAGNOSIS — I10 Essential (primary) hypertension: Secondary | ICD-10-CM | POA: Diagnosis not present

## 2016-07-04 DIAGNOSIS — Z6837 Body mass index (BMI) 37.0-37.9, adult: Secondary | ICD-10-CM | POA: Diagnosis not present

## 2016-07-04 DIAGNOSIS — E1122 Type 2 diabetes mellitus with diabetic chronic kidney disease: Secondary | ICD-10-CM | POA: Diagnosis not present

## 2016-07-04 DIAGNOSIS — M25562 Pain in left knee: Secondary | ICD-10-CM | POA: Diagnosis not present

## 2016-07-04 DIAGNOSIS — R300 Dysuria: Secondary | ICD-10-CM | POA: Diagnosis not present

## 2016-07-04 DIAGNOSIS — E119 Type 2 diabetes mellitus without complications: Secondary | ICD-10-CM | POA: Diagnosis not present

## 2016-07-04 DIAGNOSIS — R2681 Unsteadiness on feet: Secondary | ICD-10-CM | POA: Diagnosis not present

## 2016-07-04 DIAGNOSIS — Z23 Encounter for immunization: Secondary | ICD-10-CM | POA: Diagnosis not present

## 2016-07-17 ENCOUNTER — Encounter: Payer: Self-pay | Admitting: Neurology

## 2016-07-17 ENCOUNTER — Ambulatory Visit (INDEPENDENT_AMBULATORY_CARE_PROVIDER_SITE_OTHER): Payer: Medicare Other | Admitting: Neurology

## 2016-07-17 VITALS — BP 189/79 | HR 63 | Ht 75.0 in | Wt 283.5 lb

## 2016-07-17 DIAGNOSIS — R269 Unspecified abnormalities of gait and mobility: Secondary | ICD-10-CM | POA: Diagnosis not present

## 2016-07-17 DIAGNOSIS — R413 Other amnesia: Secondary | ICD-10-CM

## 2016-07-17 NOTE — Progress Notes (Signed)
Reason for visit: Gait disorder  [name] is an 75 y.o. male  History of present illness:  [name] is a 75 year old right-handed white male with a history of a progressive dementing illness and a gait disorder. The patient has fallen once since last seen, he will stumble frequently, there appears to be increased problems with walking if he has to walk longer distances. The patient has had a thorough evaluation for normal pressure hydrocephalus, he had a lumbar drain for 5 days without definite improvement in walking. The patient has poor position sense in the feet, he does have at least a moderate level of spinal stenosis that is most severe at the L3-4 level. The patient has a cane for ambulation. He has some urinary urgency and occasional incontinence at night. He cannot get to the bathroom in time to prevent incontinence. The patient has had a decline in his memory as well. He remains on Aricept and Namenda.  Past Medical History:  Diagnosis Date  . Abnormality of gait 04/05/2015  . Benign prostatic hypertrophy   . Dementia   . Diabetes mellitus type II    no meds  . Elevated PSA   . Erectile dysfunction   . Gout   . Hypertension   . Memory changes   . Memory difficulties 04/05/2015  . Nephrolithiasis    hx of  . Obstructive sleep apnea   . Partial Achilles tendon tear   . Rosacea, acne   . Tremor 04/05/2015    Past Surgical History:  Procedure Laterality Date  . CARPAL TUNNEL RELEASE  2004  . CARPAL TUNNEL RELEASE  10/31/2011   Procedure: CARPAL TUNNEL RELEASE;  Surgeon: Cammie Sickle., MD;  Location: [place];  Service: Orthopedics;  Laterality: Left;  . EYE SURGERY     cataracts  . HAND SURGERY  2007   right hand  . INGUINAL HERNIA REPAIR    . JOINT REPLACEMENT     left total knee  . KNEE SURGERY  1961   left knee  . TONSILLECTOMY    . TONSILLECTOMY AND ADENOIDECTOMY    . TRANSURETHRAL RESECTION OF PROSTATE      Family  History  Problem Relation Age of Onset  . Heart disease Mother   . Dementia Sister   . COPD Sister   . Diabetes Sister   . Thalassemia Son   . Heart disease Paternal Grandfather   . Dementia Cousin   . Colon cancer Neg Hx   . Stomach cancer Neg Hx     Social history:  reports that he has never smoked. He has never used smokeless tobacco. He reports that he does not drink alcohol or use drugs.    Allergies  Allergen Reactions  . Ciprofloxacin     REACTION: Diarrhea,l rectal irritation    Medications:  Prior to Admission medications   Medication Sig Start Date End Date Taking? Authorizing Provider  acetaminophen (TYLENOL) 650 MG CR tablet Take 1,300 mg by mouth as needed for pain.    Historical Provider, MD  allopurinol (ZYLOPRIM) 300 MG tablet Take 300 mg by mouth daily.    Historical Provider, MD  amoxicillin (AMOXIL) 500 MG capsule Take 500 mg by mouth as directed. To use 1 hour before dental appointments    Historical Provider, MD  aspirin 81 MG tablet Take 81 mg by mouth daily.    Historical Provider, MD  Cholecalciferol (VITAMIN D-3 PO) Take 2,000 Units by mouth daily.  Historical Provider, MD  Diltiazem HCl ER 360 MG CP24 Take 1 tablet by mouth daily. 10/16/13   Historical Provider, MD  donepezil (ARICEPT) 10 MG tablet TAKE ONE TABLET AT BEDTIME 12/27/15   Kathrynn Ducking, MD  finasteride (PROSCAR) 5 MG tablet Take 5 mg by mouth daily.      Historical Provider, MD  lisinopril (PRINIVIL,ZESTRIL) 40 MG tablet  01/07/16   Historical Provider, MD  memantine (NAMENDA) 10 MG tablet Take 1 tablet by mouth 2 (two) times daily. 12/02/15   Historical Provider, MD  modafinil (PROVIGIL) 200 MG tablet Take 100-200 mg by mouth daily.     Historical Provider, MD  NON FORMULARY CPAP at [place] Provider, MD  ondansetron (ZOFRAN) 4 MG tablet Take 1 tablet (4 mg total) by mouth every 8 (eight) hours as needed for nausea or vomiting. 02/13/14   Willia Craze, NP  ONETOUCH VERIO  test strip  11/17/11   Historical Provider, MD  sertraline (ZOLOFT) 50 MG tablet TAKE ONE TABLET EACH DAY 01/27/16   Kathrynn Ducking, MD    ROS:  Out of a complete 14 system review of symptoms, the patient complains only of the following symptoms, and all other reviewed systems are negative.  Decreased activity, increased appetite, fatigue Ear discharge Constipation Insomnia, sleep apnea, snoring Incontinence of the bladder Joint pain, back pain, walking difficulty, neck pain Memory loss Confusion  Blood pressure (!) 189/79, pulse 63, height 6\' 3"  (1.905 m), weight 283 lb 8 oz (128.6 kg).  Physical Exam  General: The patient is alert and cooperative at the time of the examination.  Skin: No significant peripheral edema is noted.   Neurologic Exam  Mental status: The patient is alert and oriented x 2 at the time of the examination (not oriented to date). The Mini-Mental Status Examination done today shows a total score of 16/30.   Cranial nerves: Facial symmetry is present. Speech is normal, no aphasia or dysarthria is noted. Extraocular movements are full. Visual fields are full.  Motor: The patient has good strength in all 4 extremities.  Sensory examination: Soft touch sensation is symmetric on the face, arms, and legs. No stocking pattern pinprick sensory deficit is noted. Position sense is significantly impaired in both feet.  Coordination: The patient has good finger-nose-finger and heel-to-shin bilaterally.  Gait and station: The patient has a wide-based gait, uses a cane for ambulation. Romberg is negative.  Reflexes: Deep tendon reflexes are symmetric.   MRI brain 09/09/15:  IMPRESSION: No acute intracranial abnormality. Stable noncontrast MRI appearance of the brain since 2013.  * MRI scan images were reviewed online. I agree with the written report.    Assessment/Plan:  1. Gait disorder  2. Progressive memory disorder  3. Lumbosacral spinal  stenosis  The patient has a gait disorder with significant impairment of position sense. The patient was set for nerve conduction studies of both legs, EMG on the right leg. The patient was given a prescription for a walker. I would have him use the walker at nighttime when trying to get to the bathroom. The memory disorder continues to progress. The prior evaluation for normal pressure hydrocephalus was negative.  Jill Alexanders MD 07/17/2016 11:17 AM  [facility] [address] [place], [place] [address]  Phone [phone] Fax [phone]

## 2016-07-20 ENCOUNTER — Ambulatory Visit: Payer: Medicare Other | Admitting: Neurology

## 2016-07-20 DIAGNOSIS — M25561 Pain in right knee: Secondary | ICD-10-CM | POA: Diagnosis not present

## 2016-07-20 DIAGNOSIS — Z96652 Presence of left artificial knee joint: Secondary | ICD-10-CM | POA: Diagnosis not present

## 2016-07-20 DIAGNOSIS — Z471 Aftercare following joint replacement surgery: Secondary | ICD-10-CM | POA: Diagnosis not present

## 2016-08-02 ENCOUNTER — Encounter (INDEPENDENT_AMBULATORY_CARE_PROVIDER_SITE_OTHER): Payer: Self-pay

## 2016-08-02 ENCOUNTER — Ambulatory Visit: Payer: Medicare Other | Admitting: Neurology

## 2016-08-02 ENCOUNTER — Encounter: Payer: Self-pay | Admitting: Neurology

## 2016-08-02 ENCOUNTER — Ambulatory Visit (INDEPENDENT_AMBULATORY_CARE_PROVIDER_SITE_OTHER): Payer: Medicare Other | Admitting: Neurology

## 2016-08-02 DIAGNOSIS — R269 Unspecified abnormalities of gait and mobility: Secondary | ICD-10-CM

## 2016-08-02 DIAGNOSIS — G609 Hereditary and idiopathic neuropathy, unspecified: Secondary | ICD-10-CM

## 2016-08-02 DIAGNOSIS — Z0289 Encounter for other administrative examinations: Secondary | ICD-10-CM

## 2016-08-02 HISTORY — DX: Hereditary and idiopathic neuropathy, unspecified: G60.9

## 2016-08-02 NOTE — Procedures (Signed)
     HISTORY:  [name] is a 75 year old gentleman with a history of a gait disorder, some impairment of position and vibration sensation in the feet. The patient is being evaluated for a possible peripheral neuropathy. He does have a history of lumbosacral spinal stenosis as well.  NERVE CONDUCTION STUDIES:  Nerve conduction study was performed on the left upper extremity. The distal motor latency for the left median nerve was slightly prolonged with a normal motor amplitude seen. The distal motor latency and motor amplitude for the left ulnar nerve was normal. The F wave latency for the left median nerve was borderline normal, normal for the left ulnar nerve. The nerve conduction velocities for the left median and ulnar nerves were normal. The sensory latencies for the left median and ulnar nerves were normal.  Nerve conduction studies were performed on both lower extremities. The distal motor latencies for the peroneal and posterior tibial nerves were normal bilaterally, but with significantly low motor amplitudes for these nerves bilaterally. The nerve conduction velocities for the peroneal and posterior tibial nerves were normal bilaterally. The H reflex latencies were unobtainable bilaterally, the peroneal sensory latencies were unobtainable bilaterally.  EMG STUDIES:  EMG study was performed on the right lower extremity:  The tibialis anterior muscle reveals 2 to 4K motor units with decreased recruitment. No fibrillations or positive waves were seen. The peroneus tertius muscle reveals 2 to 4K motor units with decreased recruitment. 1+ positive waves were seen. The medial gastrocnemius muscle reveals 1 to 4K motor units with decreased recruitment. No fibrillations or positive waves were seen. The vastus lateralis muscle reveals 2 to 4K motor units with full recruitment. No fibrillations or positive waves were seen. The iliopsoas muscle reveals 2 to 4K motor units with full  recruitment. No fibrillations or positive waves were seen. The biceps femoris muscle (long head) reveals 2 to 4K motor units with full recruitment. No fibrillations or positive waves were seen. The lumbosacral paraspinal muscles were tested at 3 levels, and revealed no abnormalities of insertional activity at all 3 levels tested. There was good relaxation.   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 EMG evaluation of the right lower extremity reveals mild chronic stable distal denervation consistent with the diagnosis of peripheral neuropathy. There is no clear evidence of an overlying lumbosacral radiculopathy.  Jill Alexanders MD 08/02/2016 4:23 PM  [facility] [address] [place], [place] [address]  Phone [phone] Fax [phone]

## 2016-08-03 DIAGNOSIS — R2681 Unsteadiness on feet: Secondary | ICD-10-CM | POA: Diagnosis not present

## 2016-08-03 DIAGNOSIS — Z9181 History of falling: Secondary | ICD-10-CM | POA: Diagnosis not present

## 2016-08-03 DIAGNOSIS — M48062 Spinal stenosis, lumbar region with neurogenic claudication: Secondary | ICD-10-CM | POA: Diagnosis not present

## 2016-08-03 DIAGNOSIS — R278 Other lack of coordination: Secondary | ICD-10-CM | POA: Diagnosis not present

## 2016-08-03 DIAGNOSIS — R2689 Other abnormalities of gait and mobility: Secondary | ICD-10-CM | POA: Diagnosis not present

## 2016-08-05 LAB — MULTIPLE MYELOMA PANEL, SERUM
ALBUMIN SERPL ELPH-MCNC: 3.8 g/dL (ref 2.9–4.4)
ALBUMIN/GLOB SERPL: 1.5 (ref 0.7–1.7)
ALPHA 1: 0.2 g/dL (ref 0.0–0.4)
ALPHA2 GLOB SERPL ELPH-MCNC: 0.7 g/dL (ref 0.4–1.0)
B-Globulin SerPl Elph-Mcnc: 1 g/dL (ref 0.7–1.3)
GAMMA GLOB SERPL ELPH-MCNC: 0.9 g/dL (ref 0.4–1.8)
GLOBULIN, TOTAL: 2.7 g/dL (ref 2.2–3.9)
IGG (IMMUNOGLOBIN G), SERUM: 833 mg/dL (ref 700–1600)
IgA/Immunoglobulin A, Serum: 161 mg/dL (ref 61–437)
IgM (Immunoglobulin M), Srm: 85 mg/dL (ref 15–143)
Total Protein: 6.5 g/dL (ref 6.0–8.5)

## 2016-08-05 LAB — RHEUMATOID FACTOR: Rhuematoid fact SerPl-aCnc: <10 IU/mL (ref 0.0–13.9)

## 2016-08-05 LAB — ANGIOTENSIN CONVERTING ENZYME

## 2016-08-05 LAB — B. BURGDORFI ANTIBODIES

## 2016-08-05 LAB — ANA W/REFLEX: ANA: NEGATIVE

## 2016-08-07 ENCOUNTER — Telehealth: Payer: Self-pay

## 2016-08-07 DIAGNOSIS — Z9181 History of falling: Secondary | ICD-10-CM | POA: Diagnosis not present

## 2016-08-07 DIAGNOSIS — R2689 Other abnormalities of gait and mobility: Secondary | ICD-10-CM | POA: Diagnosis not present

## 2016-08-07 DIAGNOSIS — R2681 Unsteadiness on feet: Secondary | ICD-10-CM | POA: Diagnosis not present

## 2016-08-07 DIAGNOSIS — R278 Other lack of coordination: Secondary | ICD-10-CM | POA: Diagnosis not present

## 2016-08-07 DIAGNOSIS — M48062 Spinal stenosis, lumbar region with neurogenic claudication: Secondary | ICD-10-CM | POA: Diagnosis not present

## 2016-08-07 NOTE — Telephone Encounter (Signed)
Called pt w/ unremarkable lab results. Verbalized understanding and appreciation for call. 

## 2016-08-07 NOTE — Telephone Encounter (Signed)
-----   Message from Kathrynn Ducking, MD sent at 08/06/2016  9:26 AM EDT -----  The blood work results are unremarkable. Please call the patient.  ----- Message ----- From: Lavone Neri Lab Results In Sent: 08/03/2016   7:42 AM To: Kathrynn Ducking, MD

## 2016-08-10 DIAGNOSIS — M48062 Spinal stenosis, lumbar region with neurogenic claudication: Secondary | ICD-10-CM | POA: Diagnosis not present

## 2016-08-10 DIAGNOSIS — R2689 Other abnormalities of gait and mobility: Secondary | ICD-10-CM | POA: Diagnosis not present

## 2016-08-10 DIAGNOSIS — R278 Other lack of coordination: Secondary | ICD-10-CM | POA: Diagnosis not present

## 2016-08-10 DIAGNOSIS — R2681 Unsteadiness on feet: Secondary | ICD-10-CM | POA: Diagnosis not present

## 2016-08-10 DIAGNOSIS — Z9181 History of falling: Secondary | ICD-10-CM | POA: Diagnosis not present

## 2016-08-14 DIAGNOSIS — R278 Other lack of coordination: Secondary | ICD-10-CM | POA: Diagnosis not present

## 2016-08-14 DIAGNOSIS — R2689 Other abnormalities of gait and mobility: Secondary | ICD-10-CM | POA: Diagnosis not present

## 2016-08-14 DIAGNOSIS — Z9181 History of falling: Secondary | ICD-10-CM | POA: Diagnosis not present

## 2016-08-14 DIAGNOSIS — R2681 Unsteadiness on feet: Secondary | ICD-10-CM | POA: Diagnosis not present

## 2016-08-14 DIAGNOSIS — M48062 Spinal stenosis, lumbar region with neurogenic claudication: Secondary | ICD-10-CM | POA: Diagnosis not present

## 2016-08-16 DIAGNOSIS — M48062 Spinal stenosis, lumbar region with neurogenic claudication: Secondary | ICD-10-CM | POA: Diagnosis not present

## 2016-08-16 DIAGNOSIS — Z9181 History of falling: Secondary | ICD-10-CM | POA: Diagnosis not present

## 2016-08-16 DIAGNOSIS — R2681 Unsteadiness on feet: Secondary | ICD-10-CM | POA: Diagnosis not present

## 2016-08-16 DIAGNOSIS — R278 Other lack of coordination: Secondary | ICD-10-CM | POA: Diagnosis not present

## 2016-08-16 DIAGNOSIS — R2689 Other abnormalities of gait and mobility: Secondary | ICD-10-CM | POA: Diagnosis not present

## 2016-08-24 ENCOUNTER — Other Ambulatory Visit: Payer: Self-pay | Admitting: Neurology

## 2016-08-30 DIAGNOSIS — M549 Dorsalgia, unspecified: Secondary | ICD-10-CM | POA: Diagnosis not present

## 2016-08-30 DIAGNOSIS — M48061 Spinal stenosis, lumbar region without neurogenic claudication: Secondary | ICD-10-CM | POA: Diagnosis not present

## 2016-09-11 DIAGNOSIS — L821 Other seborrheic keratosis: Secondary | ICD-10-CM | POA: Diagnosis not present

## 2016-09-11 DIAGNOSIS — Z85828 Personal history of other malignant neoplasm of skin: Secondary | ICD-10-CM | POA: Diagnosis not present

## 2016-09-13 DIAGNOSIS — M47816 Spondylosis without myelopathy or radiculopathy, lumbar region: Secondary | ICD-10-CM | POA: Diagnosis not present

## 2016-09-13 DIAGNOSIS — M48061 Spinal stenosis, lumbar region without neurogenic claudication: Secondary | ICD-10-CM | POA: Diagnosis not present

## 2016-09-18 DIAGNOSIS — F329 Major depressive disorder, single episode, unspecified: Secondary | ICD-10-CM | POA: Diagnosis not present

## 2016-09-18 DIAGNOSIS — G4733 Obstructive sleep apnea (adult) (pediatric): Secondary | ICD-10-CM | POA: Diagnosis not present

## 2016-09-18 DIAGNOSIS — R482 Apraxia: Secondary | ICD-10-CM | POA: Diagnosis not present

## 2016-09-18 DIAGNOSIS — S0990XS Unspecified injury of head, sequela: Secondary | ICD-10-CM | POA: Diagnosis not present

## 2016-09-18 DIAGNOSIS — G629 Polyneuropathy, unspecified: Secondary | ICD-10-CM | POA: Diagnosis not present

## 2016-09-18 DIAGNOSIS — G4710 Hypersomnia, unspecified: Secondary | ICD-10-CM | POA: Diagnosis not present

## 2016-09-18 DIAGNOSIS — G9389 Other specified disorders of brain: Secondary | ICD-10-CM | POA: Diagnosis not present

## 2016-09-18 DIAGNOSIS — M48061 Spinal stenosis, lumbar region without neurogenic claudication: Secondary | ICD-10-CM | POA: Diagnosis not present

## 2016-09-18 DIAGNOSIS — F0280 Dementia in other diseases classified elsewhere without behavioral disturbance: Secondary | ICD-10-CM | POA: Diagnosis not present

## 2016-09-20 DIAGNOSIS — M25562 Pain in left knee: Secondary | ICD-10-CM | POA: Diagnosis not present

## 2016-09-20 DIAGNOSIS — G609 Hereditary and idiopathic neuropathy, unspecified: Secondary | ICD-10-CM | POA: Diagnosis not present

## 2016-09-20 DIAGNOSIS — Z6837 Body mass index (BMI) 37.0-37.9, adult: Secondary | ICD-10-CM | POA: Diagnosis not present

## 2016-09-20 DIAGNOSIS — E1165 Type 2 diabetes mellitus with hyperglycemia: Secondary | ICD-10-CM | POA: Diagnosis not present

## 2016-09-20 DIAGNOSIS — R29898 Other symptoms and signs involving the musculoskeletal system: Secondary | ICD-10-CM | POA: Diagnosis not present

## 2016-09-20 DIAGNOSIS — R2681 Unsteadiness on feet: Secondary | ICD-10-CM | POA: Diagnosis not present

## 2016-09-20 DIAGNOSIS — E784 Other hyperlipidemia: Secondary | ICD-10-CM | POA: Diagnosis not present

## 2016-09-20 DIAGNOSIS — F0390 Unspecified dementia without behavioral disturbance: Secondary | ICD-10-CM | POA: Diagnosis not present

## 2016-09-20 DIAGNOSIS — E119 Type 2 diabetes mellitus without complications: Secondary | ICD-10-CM | POA: Diagnosis not present

## 2016-09-25 DIAGNOSIS — E1165 Type 2 diabetes mellitus with hyperglycemia: Secondary | ICD-10-CM | POA: Diagnosis not present

## 2016-09-25 DIAGNOSIS — M48061 Spinal stenosis, lumbar region without neurogenic claudication: Secondary | ICD-10-CM | POA: Diagnosis not present

## 2016-09-25 DIAGNOSIS — E1142 Type 2 diabetes mellitus with diabetic polyneuropathy: Secondary | ICD-10-CM | POA: Diagnosis not present

## 2016-09-25 DIAGNOSIS — I10 Essential (primary) hypertension: Secondary | ICD-10-CM | POA: Diagnosis not present

## 2016-10-11 DIAGNOSIS — R278 Other lack of coordination: Secondary | ICD-10-CM | POA: Diagnosis not present

## 2016-10-11 DIAGNOSIS — M4807 Spinal stenosis, lumbosacral region: Secondary | ICD-10-CM | POA: Diagnosis not present

## 2016-10-11 DIAGNOSIS — R4189 Other symptoms and signs involving cognitive functions and awareness: Secondary | ICD-10-CM | POA: Diagnosis not present

## 2016-10-11 DIAGNOSIS — M6389 Disorders of muscle in diseases classified elsewhere, multiple sites: Secondary | ICD-10-CM | POA: Diagnosis not present

## 2016-10-11 DIAGNOSIS — R2689 Other abnormalities of gait and mobility: Secondary | ICD-10-CM | POA: Diagnosis not present

## 2016-10-11 DIAGNOSIS — R531 Weakness: Secondary | ICD-10-CM | POA: Diagnosis not present

## 2016-10-11 DIAGNOSIS — R482 Apraxia: Secondary | ICD-10-CM | POA: Diagnosis not present

## 2016-10-12 DIAGNOSIS — R482 Apraxia: Secondary | ICD-10-CM | POA: Diagnosis not present

## 2016-10-12 DIAGNOSIS — R2689 Other abnormalities of gait and mobility: Secondary | ICD-10-CM | POA: Diagnosis not present

## 2016-10-12 DIAGNOSIS — M4807 Spinal stenosis, lumbosacral region: Secondary | ICD-10-CM | POA: Diagnosis not present

## 2016-10-12 DIAGNOSIS — M6389 Disorders of muscle in diseases classified elsewhere, multiple sites: Secondary | ICD-10-CM | POA: Diagnosis not present

## 2016-10-12 DIAGNOSIS — R4189 Other symptoms and signs involving cognitive functions and awareness: Secondary | ICD-10-CM | POA: Diagnosis not present

## 2016-10-12 DIAGNOSIS — R278 Other lack of coordination: Secondary | ICD-10-CM | POA: Diagnosis not present

## 2016-10-13 DIAGNOSIS — M6389 Disorders of muscle in diseases classified elsewhere, multiple sites: Secondary | ICD-10-CM | POA: Diagnosis not present

## 2016-10-13 DIAGNOSIS — R2689 Other abnormalities of gait and mobility: Secondary | ICD-10-CM | POA: Diagnosis not present

## 2016-10-13 DIAGNOSIS — R482 Apraxia: Secondary | ICD-10-CM | POA: Diagnosis not present

## 2016-10-13 DIAGNOSIS — R4189 Other symptoms and signs involving cognitive functions and awareness: Secondary | ICD-10-CM | POA: Diagnosis not present

## 2016-10-13 DIAGNOSIS — R278 Other lack of coordination: Secondary | ICD-10-CM | POA: Diagnosis not present

## 2016-10-13 DIAGNOSIS — M4807 Spinal stenosis, lumbosacral region: Secondary | ICD-10-CM | POA: Diagnosis not present

## 2016-10-16 DIAGNOSIS — R4189 Other symptoms and signs involving cognitive functions and awareness: Secondary | ICD-10-CM | POA: Diagnosis not present

## 2016-10-16 DIAGNOSIS — R2689 Other abnormalities of gait and mobility: Secondary | ICD-10-CM | POA: Diagnosis not present

## 2016-10-16 DIAGNOSIS — R482 Apraxia: Secondary | ICD-10-CM | POA: Diagnosis not present

## 2016-10-16 DIAGNOSIS — N183 Chronic kidney disease, stage 3 (moderate): Secondary | ICD-10-CM | POA: Diagnosis not present

## 2016-10-16 DIAGNOSIS — M4807 Spinal stenosis, lumbosacral region: Secondary | ICD-10-CM | POA: Diagnosis not present

## 2016-10-16 DIAGNOSIS — N398 Other specified disorders of urinary system: Secondary | ICD-10-CM | POA: Diagnosis not present

## 2016-10-16 DIAGNOSIS — N401 Enlarged prostate with lower urinary tract symptoms: Secondary | ICD-10-CM | POA: Diagnosis not present

## 2016-10-16 DIAGNOSIS — N138 Other obstructive and reflux uropathy: Secondary | ICD-10-CM | POA: Diagnosis not present

## 2016-10-16 DIAGNOSIS — N200 Calculus of kidney: Secondary | ICD-10-CM | POA: Diagnosis not present

## 2016-10-16 DIAGNOSIS — M6389 Disorders of muscle in diseases classified elsewhere, multiple sites: Secondary | ICD-10-CM | POA: Diagnosis not present

## 2016-10-16 DIAGNOSIS — R278 Other lack of coordination: Secondary | ICD-10-CM | POA: Diagnosis not present

## 2016-10-18 DIAGNOSIS — M6389 Disorders of muscle in diseases classified elsewhere, multiple sites: Secondary | ICD-10-CM | POA: Diagnosis not present

## 2016-10-18 DIAGNOSIS — R2689 Other abnormalities of gait and mobility: Secondary | ICD-10-CM | POA: Diagnosis not present

## 2016-10-18 DIAGNOSIS — R278 Other lack of coordination: Secondary | ICD-10-CM | POA: Diagnosis not present

## 2016-10-18 DIAGNOSIS — R482 Apraxia: Secondary | ICD-10-CM | POA: Diagnosis not present

## 2016-10-18 DIAGNOSIS — M4807 Spinal stenosis, lumbosacral region: Secondary | ICD-10-CM | POA: Diagnosis not present

## 2016-10-18 DIAGNOSIS — R4189 Other symptoms and signs involving cognitive functions and awareness: Secondary | ICD-10-CM | POA: Diagnosis not present

## 2016-10-19 DIAGNOSIS — Z961 Presence of intraocular lens: Secondary | ICD-10-CM | POA: Diagnosis not present

## 2016-10-19 DIAGNOSIS — E119 Type 2 diabetes mellitus without complications: Secondary | ICD-10-CM | POA: Diagnosis not present

## 2016-10-20 DIAGNOSIS — R278 Other lack of coordination: Secondary | ICD-10-CM | POA: Diagnosis not present

## 2016-10-20 DIAGNOSIS — M4807 Spinal stenosis, lumbosacral region: Secondary | ICD-10-CM | POA: Diagnosis not present

## 2016-10-20 DIAGNOSIS — R4189 Other symptoms and signs involving cognitive functions and awareness: Secondary | ICD-10-CM | POA: Diagnosis not present

## 2016-10-20 DIAGNOSIS — M6389 Disorders of muscle in diseases classified elsewhere, multiple sites: Secondary | ICD-10-CM | POA: Diagnosis not present

## 2016-10-20 DIAGNOSIS — R482 Apraxia: Secondary | ICD-10-CM | POA: Diagnosis not present

## 2016-10-20 DIAGNOSIS — R2689 Other abnormalities of gait and mobility: Secondary | ICD-10-CM | POA: Diagnosis not present

## 2016-10-27 DIAGNOSIS — M6389 Disorders of muscle in diseases classified elsewhere, multiple sites: Secondary | ICD-10-CM | POA: Diagnosis not present

## 2016-10-27 DIAGNOSIS — R482 Apraxia: Secondary | ICD-10-CM | POA: Diagnosis not present

## 2016-10-27 DIAGNOSIS — R2689 Other abnormalities of gait and mobility: Secondary | ICD-10-CM | POA: Diagnosis not present

## 2016-10-27 DIAGNOSIS — R4189 Other symptoms and signs involving cognitive functions and awareness: Secondary | ICD-10-CM | POA: Diagnosis not present

## 2016-10-27 DIAGNOSIS — M4807 Spinal stenosis, lumbosacral region: Secondary | ICD-10-CM | POA: Diagnosis not present

## 2016-10-27 DIAGNOSIS — R278 Other lack of coordination: Secondary | ICD-10-CM | POA: Diagnosis not present

## 2016-10-30 DIAGNOSIS — R278 Other lack of coordination: Secondary | ICD-10-CM | POA: Diagnosis not present

## 2016-10-30 DIAGNOSIS — M4807 Spinal stenosis, lumbosacral region: Secondary | ICD-10-CM | POA: Diagnosis not present

## 2016-10-30 DIAGNOSIS — M6389 Disorders of muscle in diseases classified elsewhere, multiple sites: Secondary | ICD-10-CM | POA: Diagnosis not present

## 2016-10-30 DIAGNOSIS — R4189 Other symptoms and signs involving cognitive functions and awareness: Secondary | ICD-10-CM | POA: Diagnosis not present

## 2016-10-30 DIAGNOSIS — R2689 Other abnormalities of gait and mobility: Secondary | ICD-10-CM | POA: Diagnosis not present

## 2016-10-30 DIAGNOSIS — R482 Apraxia: Secondary | ICD-10-CM | POA: Diagnosis not present

## 2016-11-01 DIAGNOSIS — R4189 Other symptoms and signs involving cognitive functions and awareness: Secondary | ICD-10-CM | POA: Diagnosis not present

## 2016-11-01 DIAGNOSIS — R2689 Other abnormalities of gait and mobility: Secondary | ICD-10-CM | POA: Diagnosis not present

## 2016-11-01 DIAGNOSIS — M4807 Spinal stenosis, lumbosacral region: Secondary | ICD-10-CM | POA: Diagnosis not present

## 2016-11-01 DIAGNOSIS — R278 Other lack of coordination: Secondary | ICD-10-CM | POA: Diagnosis not present

## 2016-11-01 DIAGNOSIS — M6389 Disorders of muscle in diseases classified elsewhere, multiple sites: Secondary | ICD-10-CM | POA: Diagnosis not present

## 2016-11-01 DIAGNOSIS — R482 Apraxia: Secondary | ICD-10-CM | POA: Diagnosis not present

## 2016-11-03 DIAGNOSIS — R278 Other lack of coordination: Secondary | ICD-10-CM | POA: Diagnosis not present

## 2016-11-03 DIAGNOSIS — R4189 Other symptoms and signs involving cognitive functions and awareness: Secondary | ICD-10-CM | POA: Diagnosis not present

## 2016-11-03 DIAGNOSIS — M6389 Disorders of muscle in diseases classified elsewhere, multiple sites: Secondary | ICD-10-CM | POA: Diagnosis not present

## 2016-11-03 DIAGNOSIS — R2689 Other abnormalities of gait and mobility: Secondary | ICD-10-CM | POA: Diagnosis not present

## 2016-11-03 DIAGNOSIS — M4807 Spinal stenosis, lumbosacral region: Secondary | ICD-10-CM | POA: Diagnosis not present

## 2016-11-03 DIAGNOSIS — R482 Apraxia: Secondary | ICD-10-CM | POA: Diagnosis not present

## 2016-11-06 DIAGNOSIS — R278 Other lack of coordination: Secondary | ICD-10-CM | POA: Diagnosis not present

## 2016-11-06 DIAGNOSIS — R482 Apraxia: Secondary | ICD-10-CM | POA: Diagnosis not present

## 2016-11-06 DIAGNOSIS — R4189 Other symptoms and signs involving cognitive functions and awareness: Secondary | ICD-10-CM | POA: Diagnosis not present

## 2016-11-06 DIAGNOSIS — M4807 Spinal stenosis, lumbosacral region: Secondary | ICD-10-CM | POA: Diagnosis not present

## 2016-11-06 DIAGNOSIS — M6389 Disorders of muscle in diseases classified elsewhere, multiple sites: Secondary | ICD-10-CM | POA: Diagnosis not present

## 2016-11-06 DIAGNOSIS — R2689 Other abnormalities of gait and mobility: Secondary | ICD-10-CM | POA: Diagnosis not present

## 2016-11-08 DIAGNOSIS — R2689 Other abnormalities of gait and mobility: Secondary | ICD-10-CM | POA: Diagnosis not present

## 2016-11-08 DIAGNOSIS — M6389 Disorders of muscle in diseases classified elsewhere, multiple sites: Secondary | ICD-10-CM | POA: Diagnosis not present

## 2016-11-08 DIAGNOSIS — R278 Other lack of coordination: Secondary | ICD-10-CM | POA: Diagnosis not present

## 2016-11-08 DIAGNOSIS — R4189 Other symptoms and signs involving cognitive functions and awareness: Secondary | ICD-10-CM | POA: Diagnosis not present

## 2016-11-08 DIAGNOSIS — R482 Apraxia: Secondary | ICD-10-CM | POA: Diagnosis not present

## 2016-11-08 DIAGNOSIS — M4807 Spinal stenosis, lumbosacral region: Secondary | ICD-10-CM | POA: Diagnosis not present

## 2016-11-10 DIAGNOSIS — R278 Other lack of coordination: Secondary | ICD-10-CM | POA: Diagnosis not present

## 2016-11-10 DIAGNOSIS — R4189 Other symptoms and signs involving cognitive functions and awareness: Secondary | ICD-10-CM | POA: Diagnosis not present

## 2016-11-10 DIAGNOSIS — R531 Weakness: Secondary | ICD-10-CM | POA: Diagnosis not present

## 2016-11-10 DIAGNOSIS — R482 Apraxia: Secondary | ICD-10-CM | POA: Diagnosis not present

## 2016-11-10 DIAGNOSIS — R2689 Other abnormalities of gait and mobility: Secondary | ICD-10-CM | POA: Diagnosis not present

## 2016-11-10 DIAGNOSIS — M4807 Spinal stenosis, lumbosacral region: Secondary | ICD-10-CM | POA: Diagnosis not present

## 2016-11-10 DIAGNOSIS — M6389 Disorders of muscle in diseases classified elsewhere, multiple sites: Secondary | ICD-10-CM | POA: Diagnosis not present

## 2016-11-13 DIAGNOSIS — Z6837 Body mass index (BMI) 37.0-37.9, adult: Secondary | ICD-10-CM | POA: Diagnosis not present

## 2016-11-13 DIAGNOSIS — R482 Apraxia: Secondary | ICD-10-CM | POA: Diagnosis not present

## 2016-11-13 DIAGNOSIS — N390 Urinary tract infection, site not specified: Secondary | ICD-10-CM | POA: Diagnosis not present

## 2016-11-13 DIAGNOSIS — M6389 Disorders of muscle in diseases classified elsewhere, multiple sites: Secondary | ICD-10-CM | POA: Diagnosis not present

## 2016-11-13 DIAGNOSIS — J029 Acute pharyngitis, unspecified: Secondary | ICD-10-CM | POA: Diagnosis not present

## 2016-11-13 DIAGNOSIS — R278 Other lack of coordination: Secondary | ICD-10-CM | POA: Diagnosis not present

## 2016-11-13 DIAGNOSIS — M4807 Spinal stenosis, lumbosacral region: Secondary | ICD-10-CM | POA: Diagnosis not present

## 2016-11-13 DIAGNOSIS — R4189 Other symptoms and signs involving cognitive functions and awareness: Secondary | ICD-10-CM | POA: Diagnosis not present

## 2016-11-13 DIAGNOSIS — R300 Dysuria: Secondary | ICD-10-CM | POA: Diagnosis not present

## 2016-11-13 DIAGNOSIS — R2689 Other abnormalities of gait and mobility: Secondary | ICD-10-CM | POA: Diagnosis not present

## 2016-11-15 DIAGNOSIS — M6389 Disorders of muscle in diseases classified elsewhere, multiple sites: Secondary | ICD-10-CM | POA: Diagnosis not present

## 2016-11-15 DIAGNOSIS — R482 Apraxia: Secondary | ICD-10-CM | POA: Diagnosis not present

## 2016-11-15 DIAGNOSIS — M4807 Spinal stenosis, lumbosacral region: Secondary | ICD-10-CM | POA: Diagnosis not present

## 2016-11-15 DIAGNOSIS — R2689 Other abnormalities of gait and mobility: Secondary | ICD-10-CM | POA: Diagnosis not present

## 2016-11-15 DIAGNOSIS — R278 Other lack of coordination: Secondary | ICD-10-CM | POA: Diagnosis not present

## 2016-11-15 DIAGNOSIS — R4189 Other symptoms and signs involving cognitive functions and awareness: Secondary | ICD-10-CM | POA: Diagnosis not present

## 2016-11-17 DIAGNOSIS — R482 Apraxia: Secondary | ICD-10-CM | POA: Diagnosis not present

## 2016-11-17 DIAGNOSIS — M4807 Spinal stenosis, lumbosacral region: Secondary | ICD-10-CM | POA: Diagnosis not present

## 2016-11-17 DIAGNOSIS — R4189 Other symptoms and signs involving cognitive functions and awareness: Secondary | ICD-10-CM | POA: Diagnosis not present

## 2016-11-17 DIAGNOSIS — M6389 Disorders of muscle in diseases classified elsewhere, multiple sites: Secondary | ICD-10-CM | POA: Diagnosis not present

## 2016-11-17 DIAGNOSIS — R278 Other lack of coordination: Secondary | ICD-10-CM | POA: Diagnosis not present

## 2016-11-17 DIAGNOSIS — R2689 Other abnormalities of gait and mobility: Secondary | ICD-10-CM | POA: Diagnosis not present

## 2016-11-21 DIAGNOSIS — R2689 Other abnormalities of gait and mobility: Secondary | ICD-10-CM | POA: Diagnosis not present

## 2016-11-21 DIAGNOSIS — M6389 Disorders of muscle in diseases classified elsewhere, multiple sites: Secondary | ICD-10-CM | POA: Diagnosis not present

## 2016-11-21 DIAGNOSIS — R482 Apraxia: Secondary | ICD-10-CM | POA: Diagnosis not present

## 2016-11-21 DIAGNOSIS — R4189 Other symptoms and signs involving cognitive functions and awareness: Secondary | ICD-10-CM | POA: Diagnosis not present

## 2016-11-21 DIAGNOSIS — R278 Other lack of coordination: Secondary | ICD-10-CM | POA: Diagnosis not present

## 2016-11-21 DIAGNOSIS — M4807 Spinal stenosis, lumbosacral region: Secondary | ICD-10-CM | POA: Diagnosis not present

## 2016-11-22 DIAGNOSIS — R278 Other lack of coordination: Secondary | ICD-10-CM | POA: Diagnosis not present

## 2016-11-22 DIAGNOSIS — R2689 Other abnormalities of gait and mobility: Secondary | ICD-10-CM | POA: Diagnosis not present

## 2016-11-22 DIAGNOSIS — R4189 Other symptoms and signs involving cognitive functions and awareness: Secondary | ICD-10-CM | POA: Diagnosis not present

## 2016-11-22 DIAGNOSIS — M6389 Disorders of muscle in diseases classified elsewhere, multiple sites: Secondary | ICD-10-CM | POA: Diagnosis not present

## 2016-11-22 DIAGNOSIS — M4807 Spinal stenosis, lumbosacral region: Secondary | ICD-10-CM | POA: Diagnosis not present

## 2016-11-22 DIAGNOSIS — R482 Apraxia: Secondary | ICD-10-CM | POA: Diagnosis not present

## 2016-11-24 DIAGNOSIS — M4807 Spinal stenosis, lumbosacral region: Secondary | ICD-10-CM | POA: Diagnosis not present

## 2016-11-24 DIAGNOSIS — R482 Apraxia: Secondary | ICD-10-CM | POA: Diagnosis not present

## 2016-11-24 DIAGNOSIS — M6389 Disorders of muscle in diseases classified elsewhere, multiple sites: Secondary | ICD-10-CM | POA: Diagnosis not present

## 2016-11-24 DIAGNOSIS — R2689 Other abnormalities of gait and mobility: Secondary | ICD-10-CM | POA: Diagnosis not present

## 2016-11-24 DIAGNOSIS — R4189 Other symptoms and signs involving cognitive functions and awareness: Secondary | ICD-10-CM | POA: Diagnosis not present

## 2016-11-24 DIAGNOSIS — R278 Other lack of coordination: Secondary | ICD-10-CM | POA: Diagnosis not present

## 2016-11-29 DIAGNOSIS — F0390 Unspecified dementia without behavioral disturbance: Secondary | ICD-10-CM | POA: Diagnosis not present

## 2016-11-29 DIAGNOSIS — M4712 Other spondylosis with myelopathy, cervical region: Secondary | ICD-10-CM | POA: Insufficient documentation

## 2016-11-29 DIAGNOSIS — M47816 Spondylosis without myelopathy or radiculopathy, lumbar region: Secondary | ICD-10-CM | POA: Diagnosis not present

## 2016-11-29 DIAGNOSIS — M48061 Spinal stenosis, lumbar region without neurogenic claudication: Secondary | ICD-10-CM | POA: Diagnosis not present

## 2016-11-29 DIAGNOSIS — M5136 Other intervertebral disc degeneration, lumbar region: Secondary | ICD-10-CM | POA: Diagnosis not present

## 2016-12-04 DIAGNOSIS — N132 Hydronephrosis with renal and ureteral calculous obstruction: Secondary | ICD-10-CM | POA: Diagnosis not present

## 2016-12-04 DIAGNOSIS — N401 Enlarged prostate with lower urinary tract symptoms: Secondary | ICD-10-CM | POA: Diagnosis not present

## 2016-12-04 DIAGNOSIS — N3001 Acute cystitis with hematuria: Secondary | ICD-10-CM | POA: Diagnosis not present

## 2016-12-04 DIAGNOSIS — N281 Cyst of kidney, acquired: Secondary | ICD-10-CM | POA: Diagnosis not present

## 2016-12-04 DIAGNOSIS — R3129 Other microscopic hematuria: Secondary | ICD-10-CM | POA: Diagnosis not present

## 2016-12-04 DIAGNOSIS — N200 Calculus of kidney: Secondary | ICD-10-CM | POA: Diagnosis not present

## 2016-12-04 DIAGNOSIS — R310 Gross hematuria: Secondary | ICD-10-CM | POA: Diagnosis not present

## 2016-12-04 DIAGNOSIS — N138 Other obstructive and reflux uropathy: Secondary | ICD-10-CM | POA: Diagnosis not present

## 2016-12-05 DIAGNOSIS — M6389 Disorders of muscle in diseases classified elsewhere, multiple sites: Secondary | ICD-10-CM | POA: Diagnosis not present

## 2016-12-05 DIAGNOSIS — R482 Apraxia: Secondary | ICD-10-CM | POA: Diagnosis not present

## 2016-12-05 DIAGNOSIS — R278 Other lack of coordination: Secondary | ICD-10-CM | POA: Diagnosis not present

## 2016-12-05 DIAGNOSIS — M4807 Spinal stenosis, lumbosacral region: Secondary | ICD-10-CM | POA: Diagnosis not present

## 2016-12-05 DIAGNOSIS — R4189 Other symptoms and signs involving cognitive functions and awareness: Secondary | ICD-10-CM | POA: Diagnosis not present

## 2016-12-05 DIAGNOSIS — R2689 Other abnormalities of gait and mobility: Secondary | ICD-10-CM | POA: Diagnosis not present

## 2016-12-11 ENCOUNTER — Ambulatory Visit (INDEPENDENT_AMBULATORY_CARE_PROVIDER_SITE_OTHER): Payer: Medicare Other | Admitting: Ophthalmology

## 2016-12-14 ENCOUNTER — Ambulatory Visit (INDEPENDENT_AMBULATORY_CARE_PROVIDER_SITE_OTHER): Payer: Medicare Other | Admitting: Ophthalmology

## 2016-12-15 DIAGNOSIS — N281 Cyst of kidney, acquired: Secondary | ICD-10-CM | POA: Insufficient documentation

## 2016-12-15 DIAGNOSIS — N200 Calculus of kidney: Secondary | ICD-10-CM | POA: Diagnosis not present

## 2016-12-15 DIAGNOSIS — N401 Enlarged prostate with lower urinary tract symptoms: Secondary | ICD-10-CM | POA: Diagnosis not present

## 2016-12-15 DIAGNOSIS — R3129 Other microscopic hematuria: Secondary | ICD-10-CM | POA: Diagnosis not present

## 2016-12-18 DIAGNOSIS — M4807 Spinal stenosis, lumbosacral region: Secondary | ICD-10-CM | POA: Diagnosis not present

## 2016-12-18 DIAGNOSIS — R482 Apraxia: Secondary | ICD-10-CM | POA: Diagnosis not present

## 2016-12-18 DIAGNOSIS — R531 Weakness: Secondary | ICD-10-CM | POA: Diagnosis not present

## 2016-12-18 DIAGNOSIS — M48061 Spinal stenosis, lumbar region without neurogenic claudication: Secondary | ICD-10-CM | POA: Diagnosis not present

## 2016-12-18 DIAGNOSIS — M6389 Disorders of muscle in diseases classified elsewhere, multiple sites: Secondary | ICD-10-CM | POA: Diagnosis not present

## 2016-12-18 DIAGNOSIS — R2689 Other abnormalities of gait and mobility: Secondary | ICD-10-CM | POA: Diagnosis not present

## 2016-12-18 DIAGNOSIS — R2681 Unsteadiness on feet: Secondary | ICD-10-CM | POA: Diagnosis not present

## 2016-12-18 DIAGNOSIS — R4189 Other symptoms and signs involving cognitive functions and awareness: Secondary | ICD-10-CM | POA: Diagnosis not present

## 2016-12-18 DIAGNOSIS — R278 Other lack of coordination: Secondary | ICD-10-CM | POA: Diagnosis not present

## 2016-12-18 DIAGNOSIS — M4712 Other spondylosis with myelopathy, cervical region: Secondary | ICD-10-CM | POA: Diagnosis not present

## 2016-12-19 DIAGNOSIS — E119 Type 2 diabetes mellitus without complications: Secondary | ICD-10-CM | POA: Diagnosis not present

## 2016-12-19 DIAGNOSIS — I10 Essential (primary) hypertension: Secondary | ICD-10-CM | POA: Diagnosis not present

## 2016-12-19 DIAGNOSIS — N183 Chronic kidney disease, stage 3 (moderate): Secondary | ICD-10-CM | POA: Diagnosis not present

## 2016-12-19 DIAGNOSIS — E1165 Type 2 diabetes mellitus with hyperglycemia: Secondary | ICD-10-CM | POA: Diagnosis not present

## 2016-12-19 DIAGNOSIS — E559 Vitamin D deficiency, unspecified: Secondary | ICD-10-CM | POA: Diagnosis not present

## 2016-12-19 DIAGNOSIS — Z6837 Body mass index (BMI) 37.0-37.9, adult: Secondary | ICD-10-CM | POA: Diagnosis not present

## 2016-12-19 DIAGNOSIS — L89151 Pressure ulcer of sacral region, stage 1: Secondary | ICD-10-CM | POA: Diagnosis not present

## 2016-12-19 DIAGNOSIS — E784 Other hyperlipidemia: Secondary | ICD-10-CM | POA: Diagnosis not present

## 2016-12-20 DIAGNOSIS — R2689 Other abnormalities of gait and mobility: Secondary | ICD-10-CM | POA: Diagnosis not present

## 2016-12-20 DIAGNOSIS — M6389 Disorders of muscle in diseases classified elsewhere, multiple sites: Secondary | ICD-10-CM | POA: Diagnosis not present

## 2016-12-20 DIAGNOSIS — R4189 Other symptoms and signs involving cognitive functions and awareness: Secondary | ICD-10-CM | POA: Diagnosis not present

## 2016-12-20 DIAGNOSIS — M48061 Spinal stenosis, lumbar region without neurogenic claudication: Secondary | ICD-10-CM | POA: Diagnosis not present

## 2016-12-20 DIAGNOSIS — M4712 Other spondylosis with myelopathy, cervical region: Secondary | ICD-10-CM | POA: Diagnosis not present

## 2016-12-20 DIAGNOSIS — R278 Other lack of coordination: Secondary | ICD-10-CM | POA: Diagnosis not present

## 2016-12-22 DIAGNOSIS — R278 Other lack of coordination: Secondary | ICD-10-CM | POA: Diagnosis not present

## 2016-12-22 DIAGNOSIS — R4189 Other symptoms and signs involving cognitive functions and awareness: Secondary | ICD-10-CM | POA: Diagnosis not present

## 2016-12-22 DIAGNOSIS — M48061 Spinal stenosis, lumbar region without neurogenic claudication: Secondary | ICD-10-CM | POA: Diagnosis not present

## 2016-12-22 DIAGNOSIS — M4712 Other spondylosis with myelopathy, cervical region: Secondary | ICD-10-CM | POA: Diagnosis not present

## 2016-12-22 DIAGNOSIS — M6389 Disorders of muscle in diseases classified elsewhere, multiple sites: Secondary | ICD-10-CM | POA: Diagnosis not present

## 2016-12-22 DIAGNOSIS — R2689 Other abnormalities of gait and mobility: Secondary | ICD-10-CM | POA: Diagnosis not present

## 2016-12-25 DIAGNOSIS — J029 Acute pharyngitis, unspecified: Secondary | ICD-10-CM | POA: Diagnosis not present

## 2016-12-25 DIAGNOSIS — J310 Chronic rhinitis: Secondary | ICD-10-CM | POA: Diagnosis not present

## 2016-12-26 DIAGNOSIS — M5003 Cervical disc disorder with myelopathy, cervicothoracic region: Secondary | ICD-10-CM | POA: Diagnosis not present

## 2016-12-26 DIAGNOSIS — M4802 Spinal stenosis, cervical region: Secondary | ICD-10-CM | POA: Diagnosis not present

## 2016-12-26 DIAGNOSIS — M4712 Other spondylosis with myelopathy, cervical region: Secondary | ICD-10-CM | POA: Diagnosis not present

## 2016-12-26 DIAGNOSIS — M48061 Spinal stenosis, lumbar region without neurogenic claudication: Secondary | ICD-10-CM | POA: Diagnosis not present

## 2016-12-27 DIAGNOSIS — M4712 Other spondylosis with myelopathy, cervical region: Secondary | ICD-10-CM | POA: Diagnosis not present

## 2016-12-27 DIAGNOSIS — M6389 Disorders of muscle in diseases classified elsewhere, multiple sites: Secondary | ICD-10-CM | POA: Diagnosis not present

## 2016-12-27 DIAGNOSIS — R278 Other lack of coordination: Secondary | ICD-10-CM | POA: Diagnosis not present

## 2016-12-27 DIAGNOSIS — R4189 Other symptoms and signs involving cognitive functions and awareness: Secondary | ICD-10-CM | POA: Diagnosis not present

## 2016-12-27 DIAGNOSIS — M48061 Spinal stenosis, lumbar region without neurogenic claudication: Secondary | ICD-10-CM | POA: Diagnosis not present

## 2016-12-27 DIAGNOSIS — R2689 Other abnormalities of gait and mobility: Secondary | ICD-10-CM | POA: Diagnosis not present

## 2016-12-28 DIAGNOSIS — R278 Other lack of coordination: Secondary | ICD-10-CM | POA: Diagnosis not present

## 2016-12-28 DIAGNOSIS — M6389 Disorders of muscle in diseases classified elsewhere, multiple sites: Secondary | ICD-10-CM | POA: Diagnosis not present

## 2016-12-28 DIAGNOSIS — R4189 Other symptoms and signs involving cognitive functions and awareness: Secondary | ICD-10-CM | POA: Diagnosis not present

## 2016-12-28 DIAGNOSIS — M48061 Spinal stenosis, lumbar region without neurogenic claudication: Secondary | ICD-10-CM | POA: Diagnosis not present

## 2016-12-28 DIAGNOSIS — R2689 Other abnormalities of gait and mobility: Secondary | ICD-10-CM | POA: Diagnosis not present

## 2016-12-28 DIAGNOSIS — M4712 Other spondylosis with myelopathy, cervical region: Secondary | ICD-10-CM | POA: Diagnosis not present

## 2016-12-29 DIAGNOSIS — M6389 Disorders of muscle in diseases classified elsewhere, multiple sites: Secondary | ICD-10-CM | POA: Diagnosis not present

## 2016-12-29 DIAGNOSIS — R278 Other lack of coordination: Secondary | ICD-10-CM | POA: Diagnosis not present

## 2016-12-29 DIAGNOSIS — M48061 Spinal stenosis, lumbar region without neurogenic claudication: Secondary | ICD-10-CM | POA: Diagnosis not present

## 2016-12-29 DIAGNOSIS — M4712 Other spondylosis with myelopathy, cervical region: Secondary | ICD-10-CM | POA: Diagnosis not present

## 2016-12-29 DIAGNOSIS — R2689 Other abnormalities of gait and mobility: Secondary | ICD-10-CM | POA: Diagnosis not present

## 2016-12-29 DIAGNOSIS — R4189 Other symptoms and signs involving cognitive functions and awareness: Secondary | ICD-10-CM | POA: Diagnosis not present

## 2017-01-01 DIAGNOSIS — M6389 Disorders of muscle in diseases classified elsewhere, multiple sites: Secondary | ICD-10-CM | POA: Diagnosis not present

## 2017-01-01 DIAGNOSIS — R2689 Other abnormalities of gait and mobility: Secondary | ICD-10-CM | POA: Diagnosis not present

## 2017-01-01 DIAGNOSIS — M48061 Spinal stenosis, lumbar region without neurogenic claudication: Secondary | ICD-10-CM | POA: Diagnosis not present

## 2017-01-01 DIAGNOSIS — M4712 Other spondylosis with myelopathy, cervical region: Secondary | ICD-10-CM | POA: Diagnosis not present

## 2017-01-01 DIAGNOSIS — R4189 Other symptoms and signs involving cognitive functions and awareness: Secondary | ICD-10-CM | POA: Diagnosis not present

## 2017-01-01 DIAGNOSIS — R278 Other lack of coordination: Secondary | ICD-10-CM | POA: Diagnosis not present

## 2017-01-02 ENCOUNTER — Other Ambulatory Visit: Payer: Self-pay | Admitting: Neurology

## 2017-01-02 DIAGNOSIS — M48061 Spinal stenosis, lumbar region without neurogenic claudication: Secondary | ICD-10-CM | POA: Diagnosis not present

## 2017-01-02 DIAGNOSIS — R4189 Other symptoms and signs involving cognitive functions and awareness: Secondary | ICD-10-CM | POA: Diagnosis not present

## 2017-01-02 DIAGNOSIS — R278 Other lack of coordination: Secondary | ICD-10-CM | POA: Diagnosis not present

## 2017-01-02 DIAGNOSIS — M4712 Other spondylosis with myelopathy, cervical region: Secondary | ICD-10-CM | POA: Diagnosis not present

## 2017-01-02 DIAGNOSIS — M6389 Disorders of muscle in diseases classified elsewhere, multiple sites: Secondary | ICD-10-CM | POA: Diagnosis not present

## 2017-01-02 DIAGNOSIS — R2689 Other abnormalities of gait and mobility: Secondary | ICD-10-CM | POA: Diagnosis not present

## 2017-01-03 ENCOUNTER — Encounter: Payer: Self-pay | Admitting: Internal Medicine

## 2017-01-04 DIAGNOSIS — M6389 Disorders of muscle in diseases classified elsewhere, multiple sites: Secondary | ICD-10-CM | POA: Diagnosis not present

## 2017-01-04 DIAGNOSIS — M4712 Other spondylosis with myelopathy, cervical region: Secondary | ICD-10-CM | POA: Diagnosis not present

## 2017-01-04 DIAGNOSIS — R4189 Other symptoms and signs involving cognitive functions and awareness: Secondary | ICD-10-CM | POA: Diagnosis not present

## 2017-01-04 DIAGNOSIS — R2689 Other abnormalities of gait and mobility: Secondary | ICD-10-CM | POA: Diagnosis not present

## 2017-01-04 DIAGNOSIS — R278 Other lack of coordination: Secondary | ICD-10-CM | POA: Diagnosis not present

## 2017-01-04 DIAGNOSIS — M48061 Spinal stenosis, lumbar region without neurogenic claudication: Secondary | ICD-10-CM | POA: Diagnosis not present

## 2017-01-05 DIAGNOSIS — R4189 Other symptoms and signs involving cognitive functions and awareness: Secondary | ICD-10-CM | POA: Diagnosis not present

## 2017-01-05 DIAGNOSIS — R2689 Other abnormalities of gait and mobility: Secondary | ICD-10-CM | POA: Diagnosis not present

## 2017-01-05 DIAGNOSIS — R278 Other lack of coordination: Secondary | ICD-10-CM | POA: Diagnosis not present

## 2017-01-05 DIAGNOSIS — M6389 Disorders of muscle in diseases classified elsewhere, multiple sites: Secondary | ICD-10-CM | POA: Diagnosis not present

## 2017-01-05 DIAGNOSIS — M4712 Other spondylosis with myelopathy, cervical region: Secondary | ICD-10-CM | POA: Diagnosis not present

## 2017-01-05 DIAGNOSIS — M48061 Spinal stenosis, lumbar region without neurogenic claudication: Secondary | ICD-10-CM | POA: Diagnosis not present

## 2017-01-09 DIAGNOSIS — M4807 Spinal stenosis, lumbosacral region: Secondary | ICD-10-CM | POA: Diagnosis not present

## 2017-01-09 DIAGNOSIS — R2681 Unsteadiness on feet: Secondary | ICD-10-CM | POA: Diagnosis not present

## 2017-01-09 DIAGNOSIS — R2689 Other abnormalities of gait and mobility: Secondary | ICD-10-CM | POA: Diagnosis not present

## 2017-01-09 DIAGNOSIS — R482 Apraxia: Secondary | ICD-10-CM | POA: Diagnosis not present

## 2017-01-09 DIAGNOSIS — M48061 Spinal stenosis, lumbar region without neurogenic claudication: Secondary | ICD-10-CM | POA: Diagnosis not present

## 2017-01-09 DIAGNOSIS — R531 Weakness: Secondary | ICD-10-CM | POA: Diagnosis not present

## 2017-01-09 DIAGNOSIS — M4712 Other spondylosis with myelopathy, cervical region: Secondary | ICD-10-CM | POA: Diagnosis not present

## 2017-01-09 DIAGNOSIS — R4189 Other symptoms and signs involving cognitive functions and awareness: Secondary | ICD-10-CM | POA: Diagnosis not present

## 2017-01-09 DIAGNOSIS — R278 Other lack of coordination: Secondary | ICD-10-CM | POA: Diagnosis not present

## 2017-01-09 DIAGNOSIS — M6389 Disorders of muscle in diseases classified elsewhere, multiple sites: Secondary | ICD-10-CM | POA: Diagnosis not present

## 2017-01-10 ENCOUNTER — Telehealth: Payer: Self-pay

## 2017-01-10 DIAGNOSIS — G218 Other secondary parkinsonism: Secondary | ICD-10-CM | POA: Diagnosis not present

## 2017-01-10 NOTE — Telephone Encounter (Signed)
Noted, thank you

## 2017-01-10 NOTE — Telephone Encounter (Signed)
This is just an FYI from the answering service:  [name] CONTACTING THE OFFICE RE:        [name]

## 2017-01-17 DIAGNOSIS — R278 Other lack of coordination: Secondary | ICD-10-CM | POA: Diagnosis not present

## 2017-01-17 DIAGNOSIS — M6389 Disorders of muscle in diseases classified elsewhere, multiple sites: Secondary | ICD-10-CM | POA: Diagnosis not present

## 2017-01-17 DIAGNOSIS — R2689 Other abnormalities of gait and mobility: Secondary | ICD-10-CM | POA: Diagnosis not present

## 2017-01-17 DIAGNOSIS — M4712 Other spondylosis with myelopathy, cervical region: Secondary | ICD-10-CM | POA: Diagnosis not present

## 2017-01-17 DIAGNOSIS — M48061 Spinal stenosis, lumbar region without neurogenic claudication: Secondary | ICD-10-CM | POA: Diagnosis not present

## 2017-01-17 DIAGNOSIS — R4189 Other symptoms and signs involving cognitive functions and awareness: Secondary | ICD-10-CM | POA: Diagnosis not present

## 2017-01-18 DIAGNOSIS — R2689 Other abnormalities of gait and mobility: Secondary | ICD-10-CM | POA: Diagnosis not present

## 2017-01-18 DIAGNOSIS — M48061 Spinal stenosis, lumbar region without neurogenic claudication: Secondary | ICD-10-CM | POA: Diagnosis not present

## 2017-01-18 DIAGNOSIS — M4712 Other spondylosis with myelopathy, cervical region: Secondary | ICD-10-CM | POA: Diagnosis not present

## 2017-01-18 DIAGNOSIS — M6389 Disorders of muscle in diseases classified elsewhere, multiple sites: Secondary | ICD-10-CM | POA: Diagnosis not present

## 2017-01-18 DIAGNOSIS — R4189 Other symptoms and signs involving cognitive functions and awareness: Secondary | ICD-10-CM | POA: Diagnosis not present

## 2017-01-18 DIAGNOSIS — R278 Other lack of coordination: Secondary | ICD-10-CM | POA: Diagnosis not present

## 2017-01-19 ENCOUNTER — Encounter: Payer: Self-pay | Admitting: Cardiovascular Disease

## 2017-01-19 DIAGNOSIS — R278 Other lack of coordination: Secondary | ICD-10-CM | POA: Diagnosis not present

## 2017-01-19 DIAGNOSIS — M4712 Other spondylosis with myelopathy, cervical region: Secondary | ICD-10-CM | POA: Diagnosis not present

## 2017-01-19 DIAGNOSIS — R2689 Other abnormalities of gait and mobility: Secondary | ICD-10-CM | POA: Diagnosis not present

## 2017-01-19 DIAGNOSIS — R4189 Other symptoms and signs involving cognitive functions and awareness: Secondary | ICD-10-CM | POA: Diagnosis not present

## 2017-01-19 DIAGNOSIS — M48061 Spinal stenosis, lumbar region without neurogenic claudication: Secondary | ICD-10-CM | POA: Diagnosis not present

## 2017-01-19 DIAGNOSIS — M6389 Disorders of muscle in diseases classified elsewhere, multiple sites: Secondary | ICD-10-CM | POA: Diagnosis not present

## 2017-01-22 DIAGNOSIS — M48061 Spinal stenosis, lumbar region without neurogenic claudication: Secondary | ICD-10-CM | POA: Diagnosis not present

## 2017-01-22 DIAGNOSIS — M6389 Disorders of muscle in diseases classified elsewhere, multiple sites: Secondary | ICD-10-CM | POA: Diagnosis not present

## 2017-01-22 DIAGNOSIS — R278 Other lack of coordination: Secondary | ICD-10-CM | POA: Diagnosis not present

## 2017-01-22 DIAGNOSIS — R2689 Other abnormalities of gait and mobility: Secondary | ICD-10-CM | POA: Diagnosis not present

## 2017-01-22 DIAGNOSIS — R4189 Other symptoms and signs involving cognitive functions and awareness: Secondary | ICD-10-CM | POA: Diagnosis not present

## 2017-01-22 DIAGNOSIS — M4712 Other spondylosis with myelopathy, cervical region: Secondary | ICD-10-CM | POA: Diagnosis not present

## 2017-01-23 DIAGNOSIS — R4189 Other symptoms and signs involving cognitive functions and awareness: Secondary | ICD-10-CM | POA: Diagnosis not present

## 2017-01-23 DIAGNOSIS — R278 Other lack of coordination: Secondary | ICD-10-CM | POA: Diagnosis not present

## 2017-01-23 DIAGNOSIS — M48061 Spinal stenosis, lumbar region without neurogenic claudication: Secondary | ICD-10-CM | POA: Diagnosis not present

## 2017-01-23 DIAGNOSIS — M4712 Other spondylosis with myelopathy, cervical region: Secondary | ICD-10-CM | POA: Diagnosis not present

## 2017-01-23 DIAGNOSIS — R2689 Other abnormalities of gait and mobility: Secondary | ICD-10-CM | POA: Diagnosis not present

## 2017-01-23 DIAGNOSIS — M6389 Disorders of muscle in diseases classified elsewhere, multiple sites: Secondary | ICD-10-CM | POA: Diagnosis not present

## 2017-01-25 DIAGNOSIS — R278 Other lack of coordination: Secondary | ICD-10-CM | POA: Diagnosis not present

## 2017-01-25 DIAGNOSIS — R2689 Other abnormalities of gait and mobility: Secondary | ICD-10-CM | POA: Diagnosis not present

## 2017-01-25 DIAGNOSIS — M6389 Disorders of muscle in diseases classified elsewhere, multiple sites: Secondary | ICD-10-CM | POA: Diagnosis not present

## 2017-01-25 DIAGNOSIS — M4712 Other spondylosis with myelopathy, cervical region: Secondary | ICD-10-CM | POA: Diagnosis not present

## 2017-01-25 DIAGNOSIS — M48061 Spinal stenosis, lumbar region without neurogenic claudication: Secondary | ICD-10-CM | POA: Diagnosis not present

## 2017-01-25 DIAGNOSIS — R4189 Other symptoms and signs involving cognitive functions and awareness: Secondary | ICD-10-CM | POA: Diagnosis not present

## 2017-01-26 DIAGNOSIS — R278 Other lack of coordination: Secondary | ICD-10-CM | POA: Diagnosis not present

## 2017-01-26 DIAGNOSIS — M6389 Disorders of muscle in diseases classified elsewhere, multiple sites: Secondary | ICD-10-CM | POA: Diagnosis not present

## 2017-01-26 DIAGNOSIS — M4712 Other spondylosis with myelopathy, cervical region: Secondary | ICD-10-CM | POA: Diagnosis not present

## 2017-01-26 DIAGNOSIS — R2689 Other abnormalities of gait and mobility: Secondary | ICD-10-CM | POA: Diagnosis not present

## 2017-01-26 DIAGNOSIS — R4189 Other symptoms and signs involving cognitive functions and awareness: Secondary | ICD-10-CM | POA: Diagnosis not present

## 2017-01-26 DIAGNOSIS — M48061 Spinal stenosis, lumbar region without neurogenic claudication: Secondary | ICD-10-CM | POA: Diagnosis not present

## 2017-01-31 DIAGNOSIS — M48061 Spinal stenosis, lumbar region without neurogenic claudication: Secondary | ICD-10-CM | POA: Diagnosis not present

## 2017-01-31 DIAGNOSIS — M6389 Disorders of muscle in diseases classified elsewhere, multiple sites: Secondary | ICD-10-CM | POA: Diagnosis not present

## 2017-01-31 DIAGNOSIS — R2689 Other abnormalities of gait and mobility: Secondary | ICD-10-CM | POA: Diagnosis not present

## 2017-01-31 DIAGNOSIS — R278 Other lack of coordination: Secondary | ICD-10-CM | POA: Diagnosis not present

## 2017-01-31 DIAGNOSIS — M4712 Other spondylosis with myelopathy, cervical region: Secondary | ICD-10-CM | POA: Diagnosis not present

## 2017-01-31 DIAGNOSIS — R4189 Other symptoms and signs involving cognitive functions and awareness: Secondary | ICD-10-CM | POA: Diagnosis not present

## 2017-02-07 DIAGNOSIS — M6389 Disorders of muscle in diseases classified elsewhere, multiple sites: Secondary | ICD-10-CM | POA: Diagnosis not present

## 2017-02-07 DIAGNOSIS — R2689 Other abnormalities of gait and mobility: Secondary | ICD-10-CM | POA: Diagnosis not present

## 2017-02-07 DIAGNOSIS — R4189 Other symptoms and signs involving cognitive functions and awareness: Secondary | ICD-10-CM | POA: Diagnosis not present

## 2017-02-07 DIAGNOSIS — R531 Weakness: Secondary | ICD-10-CM | POA: Diagnosis not present

## 2017-02-07 DIAGNOSIS — M4807 Spinal stenosis, lumbosacral region: Secondary | ICD-10-CM | POA: Diagnosis not present

## 2017-02-07 DIAGNOSIS — R482 Apraxia: Secondary | ICD-10-CM | POA: Diagnosis not present

## 2017-02-07 DIAGNOSIS — R2681 Unsteadiness on feet: Secondary | ICD-10-CM | POA: Diagnosis not present

## 2017-02-07 DIAGNOSIS — M4712 Other spondylosis with myelopathy, cervical region: Secondary | ICD-10-CM | POA: Diagnosis not present

## 2017-02-07 DIAGNOSIS — R278 Other lack of coordination: Secondary | ICD-10-CM | POA: Diagnosis not present

## 2017-02-07 DIAGNOSIS — M48061 Spinal stenosis, lumbar region without neurogenic claudication: Secondary | ICD-10-CM | POA: Diagnosis not present

## 2017-02-08 ENCOUNTER — Ambulatory Visit (INDEPENDENT_AMBULATORY_CARE_PROVIDER_SITE_OTHER): Payer: Medicare Other | Admitting: Cardiovascular Disease

## 2017-02-08 ENCOUNTER — Encounter: Payer: Self-pay | Admitting: Cardiovascular Disease

## 2017-02-08 VITALS — BP 150/80 | HR 55 | Ht 74.0 in | Wt 279.0 lb

## 2017-02-08 DIAGNOSIS — I10 Essential (primary) hypertension: Secondary | ICD-10-CM | POA: Diagnosis not present

## 2017-02-08 DIAGNOSIS — R55 Syncope and collapse: Secondary | ICD-10-CM

## 2017-02-08 MED ORDER — AMLODIPINE BESYLATE 10 MG PO TABS: 10.0000 mg | ORAL_TABLET | Freq: Every day | ORAL | 3 refills | Status: DC

## 2017-02-08 NOTE — Patient Instructions (Signed)
Medication Instructions:  Your physician has recommended you make the following change in your medication:  1. STOP Diltiazem 2. START Amlodipine 10mg  take one tablet by mouth daily  Labwork: No new orders.   Testing/Procedures: Your physician has requested that you have an echocardiogram. Echocardiography is a painless test that uses sound waves to create images of your heart. It provides your doctor with information about the size and shape of your heart and how well your heart's chambers and valves are working. This procedure takes approximately one hour. There are no restrictions for this procedure.  Follow-Up: Your physician wants you to follow-up in: 6 MONTHS with Dr Burt Knack. You will receive a reminder letter in the mail two months in advance. If you don't receive a letter, please call our office to schedule the follow-up appointment.  Any Other Special Instructions Will Be Listed Below (If Applicable).  Your physician has requested that you regularly monitor and record your blood pressure readings at home. Please use the same machine at the same time of day to check your readings and record them to bring to your follow-up visit.  Please contact the office in 3-4 WEEKS with readings.      If you need a refill on your cardiac medications before your next appointment, please call your pharmacy.

## 2017-02-08 NOTE — Progress Notes (Signed)
Cardiology Office Note Date:  02/08/2017   ID:  [name], DOB 1941-02-13, MRN [id number]  PCP:  Velna Hatchet, MD  Cardiologist:  Sherren Mocha, MD    Chief Complaint  Patient presents with  . Follow-up     History of Present Illness: [name] is a 76 y.o. male who presents for Follow-up evaluation. The patient has been followed for hypertension and hyperlipidemia. He's had progressive problems with low back issues and leg weakness. He is unable to walk and he is essentially wheelchair-bound.  He was riding in a car yesterday with his wife for an extended period time. He began to feel lightheaded and nauseated. He vomited a few times. He broke out in a "cold sweat." He did not have chest pain or shortness of breath associated with this. When they return to [facility] he was noted to have a blood glucose of 180 and a blood pressure of approximately 180 mmHg. He slowly improved and has not had recurrent symptoms but states that he still doesn't feel well. He denies chest pain or shortness of breath. He had a similar episode about 6 months ago. He continues to have a very difficult time with his mobility. He doesn't little bit of walking with a walker but this is quite difficult for him.  Past Medical History:  Diagnosis Date  . Abnormality of gait 04/05/2015  . Benign prostatic hypertrophy   . Dementia   . Diabetes mellitus type II    no meds  . Elevated PSA   . Erectile dysfunction   . Gout   . Hereditary and idiopathic peripheral neuropathy 08/02/2016  . Hypertension   . Memory changes   . Memory difficulties 04/05/2015  . Nephrolithiasis    hx of  . Obstructive sleep apnea   . Partial Achilles tendon tear   . Rosacea, acne   . Tremor 04/05/2015    Past Surgical History:  Procedure Laterality Date  . CARPAL TUNNEL RELEASE  2004  . CARPAL TUNNEL RELEASE  10/31/2011   Procedure: CARPAL TUNNEL RELEASE;  Surgeon: Cammie Sickle., MD;  Location: [facility];  Service: Orthopedics;  Laterality: Left;  . EYE SURGERY     cataracts  . HAND SURGERY  2007   right hand  . INGUINAL HERNIA REPAIR    . JOINT REPLACEMENT     left total knee  . KNEE SURGERY  1961   left knee  . TONSILLECTOMY    . TONSILLECTOMY AND ADENOIDECTOMY    . TRANSURETHRAL RESECTION OF PROSTATE      Current Outpatient Prescriptions  Medication Sig Dispense Refill  . acetaminophen (TYLENOL) 650 MG CR tablet Take 1,300 mg by mouth as needed for pain.    Marland Kitchen allopurinol (ZYLOPRIM) 300 MG tablet Take 300 mg by mouth daily.    Marland Kitchen aspirin 81 MG tablet Take 81 mg by mouth daily.    . Cholecalciferol (VITAMIN D-3 PO) Take 2,000 Units by mouth daily.     Marland Kitchen donepezil (ARICEPT) 10 MG tablet Take 10 mg by mouth at bedtime.    . finasteride (PROSCAR) 5 MG tablet Take 5 mg by mouth daily.      Marland Kitchen lisinopril (PRINIVIL,ZESTRIL) 40 MG tablet Take 40 mg by mouth daily.   4  . memantine (NAMENDA) 10 MG tablet Take 1 tablet by mouth 2 (two) times daily.  10  . metFORMIN (GLUCOPHAGE) 500 MG tablet Take 500 mg by mouth daily with breakfast.    .  modafinil (PROVIGIL) 200 MG tablet Take 100-200 mg by mouth at bedtime as needed (sleep apnea).     . NON FORMULARY CPAP at NIGHT    . ondansetron (ZOFRAN) 4 MG tablet Take 1 tablet (4 mg total) by mouth every 8 (eight) hours as needed for nausea or vomiting. 30 tablet 1  . ONETOUCH VERIO test strip 1 each by Other route as directed.     . sertraline (ZOLOFT) 50 MG tablet Take 50 mg by mouth daily.    Marland Kitchen amLODipine (NORVASC) 10 MG tablet Take 1 tablet (10 mg total) by mouth daily. 90 tablet 3   No current facility-administered medications for this visit.     Allergies:   Ciprofloxacin   Social History:  The patient  reports that he has never smoked. He has never used smokeless tobacco. He reports that he does not drink alcohol or use drugs.   Family History:  The patient's family history includes COPD in his sister; Dementia in his cousin  and sister; Diabetes in his sister; Heart disease in his mother and paternal grandfather; Thalassemia in his son.    ROS:  Please see the history of present illness.  Otherwise, review of systems is positive for Leg swelling, hearing loss, cough, dizziness, easy bruising, sweating, constipation, anxiety, weakness.  All other systems are reviewed and negative.    PHYSICAL EXAM: VS:  BP (!) 150/80   Pulse (!) 55   Ht 6\' 2"  (1.88 m)   Wt 279 lb (126.6 kg)   BMI 35.82 kg/m  , BMI Body mass index is 35.82 kg/m. GEN: Well nourished, well developed, elderly male in no acute distress  HEENT: normal  Neck: no JVD, no masses. No carotid bruits Cardiac: RRR without murmur or gallop                Respiratory:  clear to auscultation bilaterally, normal work of breathing GI: soft, nontender, nondistended, + BS MS: no deformity or atrophy  Ext: trace pretibial edema, pedal pulses 2+= bilaterally Skin: warm and dry, no rash Neuro:  Strength and sensation are intact Psych: euthymic mood, full affect  EKG:  EKG is ordered today. The ekg ordered today shows sinus bradycardia with first-degree AV block, heart rate 55 bpm, incomplete right bundle branch block, left anterior fascicular block.  Recent Labs: No results found for requested labs within last 8760 hours.   Lipid Panel     Component Value Date/Time   CHOL 151 11/11/2013 0730   TRIG 112.0 11/11/2013 0730   TRIG 159 (H) 08/21/2006 1529   HDL 33.00 (L) 11/11/2013 0730   CHOLHDL 5 11/11/2013 0730   VLDL 22.4 11/11/2013 0730   LDLCALC 96 11/11/2013 0730   LDLDIRECT 97.8 05/26/2009 1131      Wt Readings from Last 3 Encounters:  02/08/17 279 lb (126.6 kg)  07/17/16 283 lb 8 oz (128.6 kg)  01/27/16 281 lb (127.5 kg)    ASSESSMENT AND PLAN: 1.  Presyncope: Unclear etiology. Possible that he had a vasodepressor event. I reviewed his records and he has not had a previous echocardiogram. I recommended a 2-D echocardiogram and this will  be ordered. Carotid duplex was done a few years ago and this is reviewed. He had no significant carotid disease. Symptoms do not sound consistent with a cardiac arrhythmia.  2. Hypertension, uncontrolled: Reviewed his medications. He is relatively bradycardic. Will change him from diltiazem to amlodipine. Recommend an initial dose of 10 mg daily. Asked him to record  blood pressures at least a few days per week and report back in 3-4 weeks with readings. Could consider addition of a beta blocker or diuretic if blood pressure remains elevated.  Current medicines are reviewed with the patient today.  The patient does not have concerns regarding medicines.  Labs/ tests ordered today include:   Orders Placed This Encounter  Procedures  . EKG 12-Lead  . ECHOCARDIOGRAM COMPLETE    Disposition:   FU 6 months  Signed, Sherren Mocha, MD  02/08/2017 3:27 PM    [facility] [address], [place], [place]  [address] Phone: [phone]; Fax: [phone]

## 2017-02-12 DIAGNOSIS — M4712 Other spondylosis with myelopathy, cervical region: Secondary | ICD-10-CM | POA: Diagnosis not present

## 2017-02-12 DIAGNOSIS — R278 Other lack of coordination: Secondary | ICD-10-CM | POA: Diagnosis not present

## 2017-02-12 DIAGNOSIS — R2689 Other abnormalities of gait and mobility: Secondary | ICD-10-CM | POA: Diagnosis not present

## 2017-02-12 DIAGNOSIS — R4189 Other symptoms and signs involving cognitive functions and awareness: Secondary | ICD-10-CM | POA: Diagnosis not present

## 2017-02-12 DIAGNOSIS — M48061 Spinal stenosis, lumbar region without neurogenic claudication: Secondary | ICD-10-CM | POA: Diagnosis not present

## 2017-02-12 DIAGNOSIS — M6389 Disorders of muscle in diseases classified elsewhere, multiple sites: Secondary | ICD-10-CM | POA: Diagnosis not present

## 2017-02-13 DIAGNOSIS — M6389 Disorders of muscle in diseases classified elsewhere, multiple sites: Secondary | ICD-10-CM | POA: Diagnosis not present

## 2017-02-13 DIAGNOSIS — R4189 Other symptoms and signs involving cognitive functions and awareness: Secondary | ICD-10-CM | POA: Diagnosis not present

## 2017-02-13 DIAGNOSIS — M4712 Other spondylosis with myelopathy, cervical region: Secondary | ICD-10-CM | POA: Diagnosis not present

## 2017-02-13 DIAGNOSIS — R2689 Other abnormalities of gait and mobility: Secondary | ICD-10-CM | POA: Diagnosis not present

## 2017-02-13 DIAGNOSIS — M48061 Spinal stenosis, lumbar region without neurogenic claudication: Secondary | ICD-10-CM | POA: Diagnosis not present

## 2017-02-13 DIAGNOSIS — R278 Other lack of coordination: Secondary | ICD-10-CM | POA: Diagnosis not present

## 2017-02-14 DIAGNOSIS — R2689 Other abnormalities of gait and mobility: Secondary | ICD-10-CM | POA: Diagnosis not present

## 2017-02-14 DIAGNOSIS — R8299 Other abnormal findings in urine: Secondary | ICD-10-CM | POA: Diagnosis not present

## 2017-02-14 DIAGNOSIS — N390 Urinary tract infection, site not specified: Secondary | ICD-10-CM | POA: Diagnosis not present

## 2017-02-14 DIAGNOSIS — M6389 Disorders of muscle in diseases classified elsewhere, multiple sites: Secondary | ICD-10-CM | POA: Diagnosis not present

## 2017-02-14 DIAGNOSIS — R278 Other lack of coordination: Secondary | ICD-10-CM | POA: Diagnosis not present

## 2017-02-14 DIAGNOSIS — R4189 Other symptoms and signs involving cognitive functions and awareness: Secondary | ICD-10-CM | POA: Diagnosis not present

## 2017-02-14 DIAGNOSIS — M48061 Spinal stenosis, lumbar region without neurogenic claudication: Secondary | ICD-10-CM | POA: Diagnosis not present

## 2017-02-14 DIAGNOSIS — M4712 Other spondylosis with myelopathy, cervical region: Secondary | ICD-10-CM | POA: Diagnosis not present

## 2017-02-15 DIAGNOSIS — N200 Calculus of kidney: Secondary | ICD-10-CM | POA: Diagnosis not present

## 2017-02-15 DIAGNOSIS — R29898 Other symptoms and signs involving the musculoskeletal system: Secondary | ICD-10-CM | POA: Diagnosis not present

## 2017-02-15 DIAGNOSIS — N390 Urinary tract infection, site not specified: Secondary | ICD-10-CM | POA: Diagnosis not present

## 2017-02-20 ENCOUNTER — Other Ambulatory Visit: Payer: Self-pay

## 2017-02-20 ENCOUNTER — Ambulatory Visit (HOSPITAL_COMMUNITY): Payer: Medicare Other | Attending: Cardiology

## 2017-02-20 DIAGNOSIS — R55 Syncope and collapse: Secondary | ICD-10-CM | POA: Insufficient documentation

## 2017-02-20 DIAGNOSIS — I10 Essential (primary) hypertension: Secondary | ICD-10-CM | POA: Insufficient documentation

## 2017-02-21 DIAGNOSIS — R278 Other lack of coordination: Secondary | ICD-10-CM | POA: Diagnosis not present

## 2017-02-21 DIAGNOSIS — M4712 Other spondylosis with myelopathy, cervical region: Secondary | ICD-10-CM | POA: Diagnosis not present

## 2017-02-21 DIAGNOSIS — M48061 Spinal stenosis, lumbar region without neurogenic claudication: Secondary | ICD-10-CM | POA: Diagnosis not present

## 2017-02-21 DIAGNOSIS — M6389 Disorders of muscle in diseases classified elsewhere, multiple sites: Secondary | ICD-10-CM | POA: Diagnosis not present

## 2017-02-21 DIAGNOSIS — R2689 Other abnormalities of gait and mobility: Secondary | ICD-10-CM | POA: Diagnosis not present

## 2017-02-21 DIAGNOSIS — R4189 Other symptoms and signs involving cognitive functions and awareness: Secondary | ICD-10-CM | POA: Diagnosis not present

## 2017-02-23 DIAGNOSIS — R2689 Other abnormalities of gait and mobility: Secondary | ICD-10-CM | POA: Diagnosis not present

## 2017-02-23 DIAGNOSIS — M6389 Disorders of muscle in diseases classified elsewhere, multiple sites: Secondary | ICD-10-CM | POA: Diagnosis not present

## 2017-02-23 DIAGNOSIS — M4712 Other spondylosis with myelopathy, cervical region: Secondary | ICD-10-CM | POA: Diagnosis not present

## 2017-02-23 DIAGNOSIS — R278 Other lack of coordination: Secondary | ICD-10-CM | POA: Diagnosis not present

## 2017-02-23 DIAGNOSIS — R4189 Other symptoms and signs involving cognitive functions and awareness: Secondary | ICD-10-CM | POA: Diagnosis not present

## 2017-02-23 DIAGNOSIS — M48061 Spinal stenosis, lumbar region without neurogenic claudication: Secondary | ICD-10-CM | POA: Diagnosis not present

## 2017-02-27 DIAGNOSIS — R278 Other lack of coordination: Secondary | ICD-10-CM | POA: Diagnosis not present

## 2017-02-27 DIAGNOSIS — M4712 Other spondylosis with myelopathy, cervical region: Secondary | ICD-10-CM | POA: Diagnosis not present

## 2017-02-27 DIAGNOSIS — M6389 Disorders of muscle in diseases classified elsewhere, multiple sites: Secondary | ICD-10-CM | POA: Diagnosis not present

## 2017-02-27 DIAGNOSIS — R4189 Other symptoms and signs involving cognitive functions and awareness: Secondary | ICD-10-CM | POA: Diagnosis not present

## 2017-02-27 DIAGNOSIS — R2689 Other abnormalities of gait and mobility: Secondary | ICD-10-CM | POA: Diagnosis not present

## 2017-02-27 DIAGNOSIS — M48061 Spinal stenosis, lumbar region without neurogenic claudication: Secondary | ICD-10-CM | POA: Diagnosis not present

## 2017-03-12 DIAGNOSIS — L821 Other seborrheic keratosis: Secondary | ICD-10-CM | POA: Diagnosis not present

## 2017-03-12 DIAGNOSIS — B078 Other viral warts: Secondary | ICD-10-CM | POA: Diagnosis not present

## 2017-03-12 DIAGNOSIS — D1801 Hemangioma of skin and subcutaneous tissue: Secondary | ICD-10-CM | POA: Diagnosis not present

## 2017-03-12 DIAGNOSIS — Z85828 Personal history of other malignant neoplasm of skin: Secondary | ICD-10-CM | POA: Diagnosis not present

## 2017-03-13 DIAGNOSIS — G988 Other disorders of nervous system: Secondary | ICD-10-CM | POA: Diagnosis not present

## 2017-03-13 DIAGNOSIS — F0280 Dementia in other diseases classified elsewhere without behavioral disturbance: Secondary | ICD-10-CM | POA: Diagnosis not present

## 2017-03-13 DIAGNOSIS — R413 Other amnesia: Secondary | ICD-10-CM | POA: Diagnosis not present

## 2017-03-13 DIAGNOSIS — R2689 Other abnormalities of gait and mobility: Secondary | ICD-10-CM | POA: Diagnosis not present

## 2017-03-13 DIAGNOSIS — G309 Alzheimer's disease, unspecified: Secondary | ICD-10-CM | POA: Diagnosis not present

## 2017-03-13 DIAGNOSIS — R482 Apraxia: Secondary | ICD-10-CM | POA: Diagnosis not present

## 2017-03-13 DIAGNOSIS — Z993 Dependence on wheelchair: Secondary | ICD-10-CM | POA: Diagnosis not present

## 2017-03-15 DIAGNOSIS — R531 Weakness: Secondary | ICD-10-CM | POA: Diagnosis not present

## 2017-03-15 DIAGNOSIS — R278 Other lack of coordination: Secondary | ICD-10-CM | POA: Diagnosis not present

## 2017-03-15 DIAGNOSIS — R2689 Other abnormalities of gait and mobility: Secondary | ICD-10-CM | POA: Diagnosis not present

## 2017-03-15 DIAGNOSIS — M6389 Disorders of muscle in diseases classified elsewhere, multiple sites: Secondary | ICD-10-CM | POA: Diagnosis not present

## 2017-03-15 DIAGNOSIS — M4807 Spinal stenosis, lumbosacral region: Secondary | ICD-10-CM | POA: Diagnosis not present

## 2017-03-15 DIAGNOSIS — R482 Apraxia: Secondary | ICD-10-CM | POA: Diagnosis not present

## 2017-03-15 DIAGNOSIS — R4189 Other symptoms and signs involving cognitive functions and awareness: Secondary | ICD-10-CM | POA: Diagnosis not present

## 2017-04-12 DIAGNOSIS — Z7409 Other reduced mobility: Secondary | ICD-10-CM | POA: Diagnosis not present

## 2017-04-12 DIAGNOSIS — F0150 Vascular dementia without behavioral disturbance: Secondary | ICD-10-CM | POA: Diagnosis not present

## 2017-04-12 DIAGNOSIS — R5381 Other malaise: Secondary | ICD-10-CM | POA: Diagnosis not present

## 2017-04-12 DIAGNOSIS — G309 Alzheimer's disease, unspecified: Secondary | ICD-10-CM | POA: Diagnosis not present

## 2017-04-12 DIAGNOSIS — M48061 Spinal stenosis, lumbar region without neurogenic claudication: Secondary | ICD-10-CM | POA: Diagnosis not present

## 2017-04-12 DIAGNOSIS — F0280 Dementia in other diseases classified elsewhere without behavioral disturbance: Secondary | ICD-10-CM | POA: Diagnosis not present

## 2017-04-20 DIAGNOSIS — N183 Chronic kidney disease, stage 3 (moderate): Secondary | ICD-10-CM | POA: Diagnosis not present

## 2017-04-20 DIAGNOSIS — E784 Other hyperlipidemia: Secondary | ICD-10-CM | POA: Diagnosis not present

## 2017-04-20 DIAGNOSIS — E1129 Type 2 diabetes mellitus with other diabetic kidney complication: Secondary | ICD-10-CM | POA: Diagnosis not present

## 2017-04-20 DIAGNOSIS — E559 Vitamin D deficiency, unspecified: Secondary | ICD-10-CM | POA: Diagnosis not present

## 2017-04-20 DIAGNOSIS — M109 Gout, unspecified: Secondary | ICD-10-CM | POA: Diagnosis not present

## 2017-04-20 DIAGNOSIS — Z125 Encounter for screening for malignant neoplasm of prostate: Secondary | ICD-10-CM | POA: Diagnosis not present

## 2017-04-24 DIAGNOSIS — N183 Chronic kidney disease, stage 3 (moderate): Secondary | ICD-10-CM | POA: Diagnosis not present

## 2017-04-24 DIAGNOSIS — R8299 Other abnormal findings in urine: Secondary | ICD-10-CM | POA: Diagnosis not present

## 2017-04-24 LAB — MICROALBUMIN, URINE: MICROALB UR: 95.5

## 2017-04-26 DIAGNOSIS — G4733 Obstructive sleep apnea (adult) (pediatric): Secondary | ICD-10-CM | POA: Diagnosis not present

## 2017-04-26 DIAGNOSIS — F0390 Unspecified dementia without behavioral disturbance: Secondary | ICD-10-CM | POA: Diagnosis not present

## 2017-04-26 DIAGNOSIS — F418 Other specified anxiety disorders: Secondary | ICD-10-CM | POA: Diagnosis not present

## 2017-04-26 DIAGNOSIS — R413 Other amnesia: Secondary | ICD-10-CM | POA: Diagnosis not present

## 2017-04-26 DIAGNOSIS — Z1389 Encounter for screening for other disorder: Secondary | ICD-10-CM | POA: Diagnosis not present

## 2017-04-26 DIAGNOSIS — E784 Other hyperlipidemia: Secondary | ICD-10-CM | POA: Diagnosis not present

## 2017-04-26 DIAGNOSIS — G912 (Idiopathic) normal pressure hydrocephalus: Secondary | ICD-10-CM | POA: Diagnosis not present

## 2017-04-26 DIAGNOSIS — E559 Vitamin D deficiency, unspecified: Secondary | ICD-10-CM | POA: Diagnosis not present

## 2017-04-26 DIAGNOSIS — E1129 Type 2 diabetes mellitus with other diabetic kidney complication: Secondary | ICD-10-CM | POA: Diagnosis not present

## 2017-04-26 DIAGNOSIS — R29898 Other symptoms and signs involving the musculoskeletal system: Secondary | ICD-10-CM | POA: Diagnosis not present

## 2017-04-26 DIAGNOSIS — N183 Chronic kidney disease, stage 3 (moderate): Secondary | ICD-10-CM | POA: Diagnosis not present

## 2017-04-26 DIAGNOSIS — Z0000 Encounter for general adult medical examination without abnormal findings: Secondary | ICD-10-CM | POA: Diagnosis not present

## 2017-08-02 DIAGNOSIS — Z23 Encounter for immunization: Secondary | ICD-10-CM | POA: Diagnosis not present

## 2017-08-06 DIAGNOSIS — M25512 Pain in left shoulder: Secondary | ICD-10-CM | POA: Diagnosis not present

## 2017-08-06 DIAGNOSIS — M542 Cervicalgia: Secondary | ICD-10-CM | POA: Diagnosis not present

## 2017-08-23 DIAGNOSIS — M1812 Unilateral primary osteoarthritis of first carpometacarpal joint, left hand: Secondary | ICD-10-CM | POA: Diagnosis not present

## 2017-09-07 ENCOUNTER — Other Ambulatory Visit: Payer: Self-pay | Admitting: Neurology

## 2017-09-25 DIAGNOSIS — F0390 Unspecified dementia without behavioral disturbance: Secondary | ICD-10-CM | POA: Diagnosis not present

## 2017-09-25 DIAGNOSIS — M623 Immobility syndrome (paraplegic): Secondary | ICD-10-CM | POA: Diagnosis not present

## 2017-09-25 DIAGNOSIS — E119 Type 2 diabetes mellitus without complications: Secondary | ICD-10-CM | POA: Diagnosis not present

## 2017-09-25 DIAGNOSIS — I10 Essential (primary) hypertension: Secondary | ICD-10-CM | POA: Diagnosis not present

## 2017-10-15 DIAGNOSIS — N183 Chronic kidney disease, stage 3 (moderate): Secondary | ICD-10-CM | POA: Diagnosis not present

## 2017-10-15 DIAGNOSIS — N401 Enlarged prostate with lower urinary tract symptoms: Secondary | ICD-10-CM | POA: Diagnosis not present

## 2017-10-15 DIAGNOSIS — N281 Cyst of kidney, acquired: Secondary | ICD-10-CM | POA: Diagnosis not present

## 2017-10-15 DIAGNOSIS — E119 Type 2 diabetes mellitus without complications: Secondary | ICD-10-CM | POA: Diagnosis not present

## 2017-10-15 DIAGNOSIS — N200 Calculus of kidney: Secondary | ICD-10-CM | POA: Diagnosis not present

## 2017-10-15 DIAGNOSIS — N398 Other specified disorders of urinary system: Secondary | ICD-10-CM | POA: Diagnosis not present

## 2017-11-12 ENCOUNTER — Emergency Department (HOSPITAL_COMMUNITY): Payer: Medicare Other

## 2017-11-12 ENCOUNTER — Emergency Department (HOSPITAL_COMMUNITY)
Admission: EM | Admit: 2017-11-12 | Discharge: 2017-11-12 | Disposition: A | Discharge status: Other Acute Inpatient Hospital | Payer: Medicare Other | Attending: Emergency Medicine | Admitting: Emergency Medicine

## 2017-11-12 ENCOUNTER — Encounter (HOSPITAL_COMMUNITY): Payer: Self-pay

## 2017-11-12 DIAGNOSIS — Z7984 Long term (current) use of oral hypoglycemic drugs: Secondary | ICD-10-CM | POA: Insufficient documentation

## 2017-11-12 DIAGNOSIS — Z79899 Other long term (current) drug therapy: Secondary | ICD-10-CM | POA: Insufficient documentation

## 2017-11-12 DIAGNOSIS — F0390 Unspecified dementia without behavioral disturbance: Secondary | ICD-10-CM | POA: Diagnosis not present

## 2017-11-12 DIAGNOSIS — R05 Cough: Secondary | ICD-10-CM | POA: Diagnosis not present

## 2017-11-12 DIAGNOSIS — Z7982 Long term (current) use of aspirin: Secondary | ICD-10-CM | POA: Insufficient documentation

## 2017-11-12 DIAGNOSIS — R319 Hematuria, unspecified: Secondary | ICD-10-CM | POA: Insufficient documentation

## 2017-11-12 DIAGNOSIS — N189 Chronic kidney disease, unspecified: Secondary | ICD-10-CM | POA: Diagnosis not present

## 2017-11-12 DIAGNOSIS — I129 Hypertensive chronic kidney disease with stage 1 through stage 4 chronic kidney disease, or unspecified chronic kidney disease: Secondary | ICD-10-CM | POA: Diagnosis not present

## 2017-11-12 DIAGNOSIS — R0602 Shortness of breath: Secondary | ICD-10-CM | POA: Insufficient documentation

## 2017-11-12 DIAGNOSIS — R531 Weakness: Secondary | ICD-10-CM | POA: Insufficient documentation

## 2017-11-12 DIAGNOSIS — R509 Fever, unspecified: Secondary | ICD-10-CM | POA: Insufficient documentation

## 2017-11-12 DIAGNOSIS — E119 Type 2 diabetes mellitus without complications: Secondary | ICD-10-CM | POA: Diagnosis not present

## 2017-11-12 LAB — I-STAT TROPONIN, ED: TROPONIN I, POC: 0.02 ng/mL (ref 0.00–0.08)

## 2017-11-12 LAB — CBC WITH DIFFERENTIAL/PLATELET
Basophils Absolute: 0 10*3/uL (ref 0.0–0.1)
Basophils Relative: 0 %
EOS PCT: 5 %
Eosinophils Absolute: 0.4 10*3/uL (ref 0.0–0.7)
HCT: 34.8 % — ABNORMAL LOW (ref 39.0–52.0)
Hemoglobin: 11.3 g/dL — ABNORMAL LOW (ref 13.0–17.0)
LYMPHS ABS: 1.3 10*3/uL (ref 0.7–4.0)
Lymphocytes Relative: 17 %
MCH: 21.2 pg — AB (ref 26.0–34.0)
MCHC: 32.5 g/dL (ref 30.0–36.0)
MCV: 65.3 fL — AB (ref 78.0–100.0)
MONO ABS: 1.4 10*3/uL — AB (ref 0.1–1.0)
Monocytes Relative: 18 %
NEUTROS ABS: 4.7 10*3/uL (ref 1.7–7.7)
Neutrophils Relative %: 60 %
PLATELETS: 172 10*3/uL (ref 150–400)
RBC: 5.33 MIL/uL (ref 4.22–5.81)
RDW: 15.8 % — AB (ref 11.5–15.5)
WBC: 7.8 10*3/uL (ref 4.0–10.5)

## 2017-11-12 LAB — COMPREHENSIVE METABOLIC PANEL
ALT: 20 U/L (ref 17–63)
AST: 24 U/L (ref 15–41)
Albumin: 3.8 g/dL (ref 3.5–5.0)
Alkaline Phosphatase: 72 U/L (ref 38–126)
Anion gap: 8 (ref 5–15)
BILIRUBIN TOTAL: 0.5 mg/dL (ref 0.3–1.2)
BUN: 18 mg/dL (ref 6–20)
CHLORIDE: 105 mmol/L (ref 101–111)
CO2: 25 mmol/L (ref 22–32)
CREATININE: 1.47 mg/dL — AB (ref 0.61–1.24)
Calcium: 8.8 mg/dL — ABNORMAL LOW (ref 8.9–10.3)
GFR calc Af Amer: 51 mL/min — ABNORMAL LOW (ref >60)
GFR calc non Af Amer: 44 mL/min — ABNORMAL LOW (ref >60)
Glucose, Bld: 182 mg/dL — ABNORMAL HIGH (ref 65–99)
Potassium: 4 mmol/L (ref 3.5–5.1)
Sodium: 138 mmol/L (ref 135–145)
Total Protein: 6.5 g/dL (ref 6.5–8.1)

## 2017-11-12 LAB — URINALYSIS, ROUTINE W REFLEX MICROSCOPIC
Bacteria, UA: NONE SEEN
Bilirubin Urine: NEGATIVE
Glucose, UA: NEGATIVE mg/dL
Ketones, ur: NEGATIVE mg/dL
Leukocytes, UA: NEGATIVE
Nitrite: NEGATIVE
PH: 7 (ref 5.0–8.0)
Protein, ur: NEGATIVE mg/dL
SPECIFIC GRAVITY, URINE: 1.011 (ref 1.005–1.030)

## 2017-11-12 LAB — I-STAT CG4 LACTIC ACID, ED: LACTIC ACID, VENOUS: 1.29 mmol/L (ref 0.5–1.9)

## 2017-11-12 LAB — INFLUENZA PANEL BY PCR (TYPE A & B)
INFLBPCR: NEGATIVE
Influenza A By PCR: NEGATIVE

## 2017-11-12 LAB — BRAIN NATRIURETIC PEPTIDE: B NATRIURETIC PEPTIDE 5: 308.8 pg/mL — AB (ref 0.0–100.0)

## 2017-11-12 MED ORDER — CEPHALEXIN 500 MG PO CAPS: 1000.0000 mg | ORAL_CAPSULE | Freq: Two times a day (BID) | ORAL | 0 refills | Status: DC

## 2017-11-12 MED ORDER — DEXTROSE 5 % IV SOLN
1.0000 g | Freq: Once | INTRAVENOUS | Status: AC
  Administered 2017-11-12: 1 g via INTRAVENOUS
  Filled 2017-11-12: qty 10

## 2017-11-12 NOTE — ED Provider Notes (Signed)
[facility] DEPT Provider Note   CSN: [id number] Arrival date & time: 11/12/17  1611     History   Chief Complaint Chief Complaint  Patient presents with  . Cough  . Fever    HPI [name] is a 77 y.o. male.  HPI Patient's wife reports that they live at [facility].  The patient has advancing dementia.  He has been essentially wheelchair bound for the past half a year.  Gradual decline in general function.  He however was significantly more weak starting last night and today.  He did have a fever reportedly of 101 he has had urinary tract infection in the past and reportedly urine is foul-smelling.  He has not had any significant cough or shortness of breath.  Patient does endorse sore throat.  No vomiting or diarrhea.  She reports today when he was being assisted for showering, he was listing more to the side than normal and required much more help for transferring.  He was seen by his PCP today and referred to the emergency department for further evaluation for possible pneumonia or UTI.  Patient's wife reports she was sick with pneumonia about a week or 2 ago.  She tested negative for influenza.  She reports she recently finished antibiotics and is improved.  Patient denies any complaints but did endorse sore throat.  Patient has history of kidney stones.  He is denying that he experienced any severe flank pain.  He is alert and interactive but does not have good recall. Past Medical History:  Diagnosis Date  . Abnormality of gait 04/05/2015  . Benign prostatic hypertrophy   . Dementia   . Diabetes mellitus type II    no meds  . Elevated PSA   . Erectile dysfunction   . Gout   . Hereditary and idiopathic peripheral neuropathy 08/02/2016  . Hypertension   . Memory changes   . Memory difficulties 04/05/2015  . Nephrolithiasis    hx of  . Obstructive sleep apnea   . Partial Achilles tendon tear   . Rosacea, acne   . Tremor 04/05/2015     Patient Active Problem List   Diagnosis Date Noted  . Hereditary and idiopathic peripheral neuropathy 08/02/2016  . Memory difficulties 04/05/2015  . Abnormality of gait 04/05/2015  . Tremor 04/05/2015  . Unspecified constipation 02/13/2014  . Syncope 10/24/2012  . Renal insufficiency 11/06/2011  . OSTEOARTHRITIS, KNEE, LEFT 03/03/2010  . OBSTRUCTIVE SLEEP APNEA 06/04/2007  . PSA, INCREASED 06/04/2007  . DIABETES MELLITUS, TYPE II 01/10/2007  . GOUT 01/10/2007  . HYPERTENSION 01/10/2007  . BENIGN PROSTATIC HYPERTROPHY 01/10/2007  . NEPHROLITHIASIS, HX OF 01/10/2007    Past Surgical History:  Procedure Laterality Date  . CARPAL TUNNEL RELEASE  2004  . CARPAL TUNNEL RELEASE  10/31/2011   Procedure: CARPAL TUNNEL RELEASE;  Surgeon: Cammie Sickle., MD;  Location: [facility];  Service: Orthopedics;  Laterality: Left;  . EYE SURGERY     cataracts  . HAND SURGERY  2007   right hand  . INGUINAL HERNIA REPAIR    . JOINT REPLACEMENT     left total knee  . KNEE SURGERY  1961   left knee  . TONSILLECTOMY    . TONSILLECTOMY AND ADENOIDECTOMY    . TRANSURETHRAL RESECTION OF PROSTATE         Home Medications    Prior to Admission medications   Medication Sig Start Date End Date Taking? Authorizing Provider  allopurinol (ZYLOPRIM) 100 MG tablet Take 100 mg by mouth daily.  10/12/17  Yes [provider]  amLODipine (NORVASC) 10 MG tablet Take 10 mg by mouth daily.   Yes [provider]  aspirin 81 MG tablet Take 81 mg by mouth daily.   Yes [provider]  Cholecalciferol (VITAMIN D-3 PO) Take 2,000 Units by mouth daily.    Yes [provider]  donepezil (ARICEPT) 5 MG tablet Take 5 mg by mouth at bedtime.   Yes [provider]  EPINEPHrine (EPIPEN 2-PAK) 0.3 mg/0.3 mL IJ SOAJ injection Inject 0.3 mg into the muscle once.   Yes [provider]  finasteride (PROSCAR) 5 MG tablet Take 5 mg by mouth at  bedtime.    Yes [provider]  fluticasone (FLONASE) 50 MCG/ACT nasal spray as needed. 11/13/16  Yes [provider]  lisinopril (PRINIVIL,ZESTRIL) 10 MG tablet Take 10 mg by mouth at bedtime.    Yes [provider]  memantine (NAMENDA) 10 MG tablet Take 1 tablet by mouth 2 (two) times daily. 12/02/15  Yes [provider]  metFORMIN (GLUCOPHAGE) 500 MG tablet Take 500 mg by mouth daily with breakfast.   Yes [provider]  pantoprazole (PROTONIX) 40 MG tablet Take 40 mg by mouth daily.  11/05/17  Yes [provider]  sertraline (ZOLOFT) 50 MG tablet Take 50 mg by mouth daily.   Yes [provider]  acetaminophen (TYLENOL) 650 MG CR tablet Take 1,300 mg by mouth as needed for pain.    [provider]  allopurinol (ZYLOPRIM) 300 MG tablet Take 300 mg by mouth daily.    [provider]  amLODipine (NORVASC) 10 MG tablet Take 1 tablet (10 mg total) by mouth daily. 02/08/17 05/09/17  Sherren Mocha, MD  aspirin EC 81 MG tablet Take 81 mg by mouth daily.    [provider]  cephALEXin (KEFLEX) 500 MG capsule Take 2 capsules (1,000 mg total) by mouth 2 (two) times daily. 11/12/17   Charlesetta Shanks, MD  diclofenac sodium (VOLTAREN) 1 % GEL  09/28/17   [provider]  donepezil (ARICEPT) 10 MG tablet Take 10 mg by mouth at bedtime.    [provider]  lisinopril (PRINIVIL,ZESTRIL) 40 MG tablet Take 40 mg by mouth daily.  01/07/16   [provider]  modafinil (PROVIGIL) 200 MG tablet Take 100-200 mg by mouth at bedtime as needed (sleep apnea).     [provider]  NON FORMULARY CPAP at [facility]    [provider]  ondansetron (ZOFRAN) 4 MG tablet Take 1 tablet (4 mg total) by mouth every 8 (eight) hours as needed for nausea or vomiting. Patient not taking: Reported on 11/12/2017 02/13/14   Willia Craze, NP  [facility] VERIO test strip 1 each by Other route as directed.  11/17/11    [provider]    Family History Family History  Problem Relation Age of Onset  . Heart disease Mother   . Dementia Sister   . COPD Sister   . Diabetes Sister   . Thalassemia Son   . Heart disease Paternal Grandfather   . Dementia Cousin   . Colon cancer Neg Hx   . Stomach cancer Neg Hx     Social History Social History   Tobacco Use  . Smoking status: Never Smoker  . Smokeless tobacco: Never Used  Substance Use Topics  . Alcohol use: No    Comment: rarely  . Drug  use: No     Allergies   Ciprofloxacin   Review of Systems Review of Systems 10 Systems reviewed and are negative for acute change except as noted in the HPI.   Physical Exam Updated Vital Signs BP (!) 151/74 (BP Location: Right Arm)   Pulse 64   Temp 98.5 F (36.9 C) (Oral)   Resp 20   Ht 6\' 3"  (1.905 m)   Wt 131.5 kg (290 lb)   SpO2 97%   BMI 36.25 kg/m   Physical Exam  Constitutional: He appears well-developed and well-nourished.  Patient is alert and pleasantly interactive.  No respiratory distress at rest.  HENT:  Head: Normocephalic and atraumatic.  Nose: Nose normal.  Mouth/Throat: Oropharynx is clear and moist.  Bilateral TMs normal.  Posterior oropharynx no erythema or exudate.  Voice is slightly hoarse which a patient reports is not normal for him.  Eyes: Conjunctivae and EOM are normal. Pupils are equal, round, and reactive to light.  Neck: Neck supple.  Cardiovascular: Normal rate, regular rhythm, normal heart sounds and intact distal pulses.  No murmur heard. Pulmonary/Chest: Effort normal and breath sounds normal. No respiratory distress.  Abdominal: Soft. He exhibits no distension. There is no tenderness. There is no guarding.  Musculoskeletal: Normal range of motion. He exhibits no edema or tenderness.  Feet and legs examined.  No erythema, wounds or rashes.  Trace pitting edema at ankles.  Lymphadenopathy:    He has no cervical adenopathy.  Neurological: He is  alert.  Skin: Skin is warm and dry.  Psychiatric: He has a normal mood and affect.  Nursing note and vitals reviewed.    ED Treatments / Results  Labs (all labs ordered are listed, but only abnormal results are displayed) Labs Reviewed  COMPREHENSIVE METABOLIC PANEL - Abnormal; Notable for the following components:      Result Value   Glucose, Bld 182 (*)    Creatinine, Ser 1.47 (*)    Calcium 8.8 (*)    GFR calc non Af Amer 44 (*)    GFR calc Af Amer 51 (*)    All other components within normal limits  CBC WITH DIFFERENTIAL/PLATELET - Abnormal; Notable for the following components:   Hemoglobin 11.3 (*)    HCT 34.8 (*)    MCV 65.3 (*)    MCH 21.2 (*)    RDW 15.8 (*)    Monocytes Absolute 1.4 (*)    All other components within normal limits  URINALYSIS, ROUTINE W REFLEX MICROSCOPIC - Abnormal; Notable for the following components:   Hgb urine dipstick MODERATE (*)    Squamous Epithelial / LPF 0-5 (*)    All other components within normal limits  BRAIN NATRIURETIC PEPTIDE - Abnormal; Notable for the following components:   B Natriuretic Peptide 308.8 (*)    All other components within normal limits  CULTURE, BLOOD (ROUTINE X 2)  CULTURE, BLOOD (ROUTINE X 2)  URINE CULTURE  INFLUENZA PANEL BY PCR (TYPE A & B)  I-STAT CG4 LACTIC ACID, ED  I-STAT CG4 LACTIC ACID, ED  I-STAT TROPONIN, ED    EKG  EKG Interpretation  Date/Time:  Monday November 12 2017 18:22:57 EST Ventricular Rate:  60 PR Interval:    QRS Duration: 102 QT Interval:  458 QTC Calculation: 458 R Axis:   -52 Text Interpretation:  Sinus rhythm Prolonged PR interval Left anterior fascicular block Abnormal R-wave progression, early transition no sig chabge from previous Confirmed by Charlesetta Shanks [phone]) on 11/12/2017 7:37:49 PM  Also confirmed by Charlesetta Shanks [phone]), editor Hattie Perch [phone])  on 11/13/2017 8:02:55 AM       Radiology Dg Chest 2 View  Result Date: 11/12/2017 CLINICAL DATA:   Cough and fever EXAM: CHEST  2 VIEW COMPARISON:  Mar 08, 2006 FINDINGS: There is no edema or consolidation. Heart is upper normal in size with pulmonary vascularity within normal limits. No adenopathy. No evident bone lesions. IMPRESSION: No edema or consolidation. Electronically Signed   By: Lowella Grip III M.D.   On: 11/12/2017 19:01    Procedures Procedures (including critical care time)  Medications Ordered in ED Medications  cefTRIAXone (ROCEPHIN) 1 g in dextrose 5 % 50 mL IVPB (0 g Intravenous Stopped 11/12/17 2124)     Initial Impression / Assessment and Plan / ED Course  I have reviewed the triage vital signs and the nursing notes.  Pertinent labs & imaging results that were available during my care of the patient were reviewed by me and considered in my medical decision making (see chart for details).      Final Clinical Impressions(s) / ED Diagnoses   Final diagnoses:  Fever, unspecified fever cause  Hematuria, unspecified type  General weakness  Patient presents with increased general weakness over the course of the past 1-2 days.  Also, documented outpatient fever.  Patient has been experiencing progressive decline over the past several years which worsened over about the past 6 months.  Patient was referred to the emergency department with concern for possible pneumonia or UTI.  Chest x-ray is clear.  Patient does not have respiratory distress.  Urinalysis is grossly positive for red blood cells, will initiate empiric dose of Rocephin in the emergency department and submit blood cultures.  Influenza is negative.  It is still possible this is a viral type illness.  Patient's wife is counseled to let her primary doctor know the cultures for urine and blood are pending and final decision for continuing antibiotics when these cultures are complete.  Return precautions reviewed.  ED Discharge Orders        Ordered    cephALEXin (KEFLEX) 500 MG capsule  2 times daily      11/12/17 2107       Charlesetta Shanks, MD 11/13/17 [phone]

## 2017-11-12 NOTE — ED Triage Notes (Signed)
Pt arrived via EMS from [place]. Per EMS pt developed a productive cough and fever yesterday. Pt also reports generalized weakness, Pt wife reports fouls smelling odor and pt has HX of UTI. Pt has hx of dementia.    V/s EMS  150/90 HR 60 RR 18, 99.3 t, CBG 243,  O2 98% RA.

## 2017-11-12 NOTE — ED Notes (Signed)
Bed: WA02 Expected date:  Expected time:  Means of arrival:  Comments: EMS-cough/UTI

## 2017-11-12 NOTE — Discharge Instructions (Signed)
1.  You have been given a dose of Rocephin in the emergency department.  This antibiotic is effective for 24 hours. 2.  You may start your Keflex antibiotic tomorrow evening. 3.  Call your family doctor tomorrow morning to let them know they need to follow-up on your urine and blood cultures. 4.  It is possible that you have a viral illness but are being treated with antibiotics until your cultures have returned. 5.  Return to the emergency department if your symptoms are worsening or changing. 6.  Treat fever with acetaminophen.

## 2017-11-12 NOTE — ED Notes (Signed)
[facility] is at bedside to transport patient back to [facility]. Patients spouse has his belongings. Patient appears in no distress.

## 2017-11-14 LAB — URINE CULTURE: CULTURE: NO GROWTH

## 2017-11-16 DIAGNOSIS — F0280 Dementia in other diseases classified elsewhere without behavioral disturbance: Secondary | ICD-10-CM | POA: Diagnosis not present

## 2017-11-16 DIAGNOSIS — R488 Other symbolic dysfunctions: Secondary | ICD-10-CM | POA: Diagnosis not present

## 2017-11-16 DIAGNOSIS — F0150 Vascular dementia without behavioral disturbance: Secondary | ICD-10-CM | POA: Diagnosis not present

## 2017-11-16 DIAGNOSIS — F0390 Unspecified dementia without behavioral disturbance: Secondary | ICD-10-CM | POA: Diagnosis not present

## 2017-11-16 DIAGNOSIS — G609 Hereditary and idiopathic neuropathy, unspecified: Secondary | ICD-10-CM | POA: Diagnosis not present

## 2017-11-16 DIAGNOSIS — M6389 Disorders of muscle in diseases classified elsewhere, multiple sites: Secondary | ICD-10-CM | POA: Diagnosis not present

## 2017-11-16 DIAGNOSIS — M48061 Spinal stenosis, lumbar region without neurogenic claudication: Secondary | ICD-10-CM | POA: Diagnosis not present

## 2017-11-16 DIAGNOSIS — R2681 Unsteadiness on feet: Secondary | ICD-10-CM | POA: Diagnosis not present

## 2017-11-16 DIAGNOSIS — R4701 Aphasia: Secondary | ICD-10-CM | POA: Diagnosis not present

## 2017-11-16 DIAGNOSIS — G912 (Idiopathic) normal pressure hydrocephalus: Secondary | ICD-10-CM | POA: Diagnosis not present

## 2017-11-16 DIAGNOSIS — R296 Repeated falls: Secondary | ICD-10-CM | POA: Diagnosis not present

## 2017-11-16 DIAGNOSIS — G301 Alzheimer's disease with late onset: Secondary | ICD-10-CM | POA: Diagnosis not present

## 2017-11-16 DIAGNOSIS — R2689 Other abnormalities of gait and mobility: Secondary | ICD-10-CM | POA: Diagnosis not present

## 2017-11-16 DIAGNOSIS — R278 Other lack of coordination: Secondary | ICD-10-CM | POA: Diagnosis not present

## 2017-11-16 DIAGNOSIS — R413 Other amnesia: Secondary | ICD-10-CM | POA: Diagnosis not present

## 2017-11-16 DIAGNOSIS — R482 Apraxia: Secondary | ICD-10-CM | POA: Diagnosis not present

## 2017-11-17 LAB — CULTURE, BLOOD (ROUTINE X 2)
Culture: NO GROWTH
Culture: NO GROWTH
Special Requests: ADEQUATE
Special Requests: ADEQUATE

## 2017-11-19 DIAGNOSIS — R482 Apraxia: Secondary | ICD-10-CM | POA: Diagnosis not present

## 2017-11-19 DIAGNOSIS — R4701 Aphasia: Secondary | ICD-10-CM | POA: Diagnosis not present

## 2017-11-19 DIAGNOSIS — R278 Other lack of coordination: Secondary | ICD-10-CM | POA: Diagnosis not present

## 2017-11-19 DIAGNOSIS — R488 Other symbolic dysfunctions: Secondary | ICD-10-CM | POA: Diagnosis not present

## 2017-11-19 DIAGNOSIS — M6389 Disorders of muscle in diseases classified elsewhere, multiple sites: Secondary | ICD-10-CM | POA: Diagnosis not present

## 2017-11-19 DIAGNOSIS — R2689 Other abnormalities of gait and mobility: Secondary | ICD-10-CM | POA: Diagnosis not present

## 2017-11-20 ENCOUNTER — Non-Acute Institutional Stay (SKILLED_NURSING_FACILITY): Payer: Medicare Other | Admitting: Internal Medicine

## 2017-11-20 ENCOUNTER — Encounter: Payer: Self-pay | Admitting: Internal Medicine

## 2017-11-20 DIAGNOSIS — G4733 Obstructive sleep apnea (adult) (pediatric): Secondary | ICD-10-CM | POA: Diagnosis not present

## 2017-11-20 DIAGNOSIS — K5901 Slow transit constipation: Secondary | ICD-10-CM | POA: Diagnosis not present

## 2017-11-20 DIAGNOSIS — R4701 Aphasia: Secondary | ICD-10-CM | POA: Diagnosis not present

## 2017-11-20 DIAGNOSIS — M4712 Other spondylosis with myelopathy, cervical region: Secondary | ICD-10-CM

## 2017-11-20 DIAGNOSIS — N200 Calculus of kidney: Secondary | ICD-10-CM | POA: Diagnosis not present

## 2017-11-20 DIAGNOSIS — F0280 Dementia in other diseases classified elsewhere without behavioral disturbance: Secondary | ICD-10-CM

## 2017-11-20 DIAGNOSIS — R482 Apraxia: Secondary | ICD-10-CM

## 2017-11-20 DIAGNOSIS — F329 Major depressive disorder, single episode, unspecified: Secondary | ICD-10-CM | POA: Diagnosis not present

## 2017-11-20 DIAGNOSIS — G309 Alzheimer's disease, unspecified: Secondary | ICD-10-CM

## 2017-11-20 DIAGNOSIS — N183 Chronic kidney disease, stage 3 unspecified: Secondary | ICD-10-CM

## 2017-11-20 DIAGNOSIS — I129 Hypertensive chronic kidney disease with stage 1 through stage 4 chronic kidney disease, or unspecified chronic kidney disease: Secondary | ICD-10-CM | POA: Diagnosis not present

## 2017-11-20 DIAGNOSIS — R2689 Other abnormalities of gait and mobility: Secondary | ICD-10-CM | POA: Diagnosis not present

## 2017-11-20 DIAGNOSIS — F0150 Vascular dementia without behavioral disturbance: Secondary | ICD-10-CM | POA: Insufficient documentation

## 2017-11-20 DIAGNOSIS — N401 Enlarged prostate with lower urinary tract symptoms: Secondary | ICD-10-CM

## 2017-11-20 DIAGNOSIS — M48061 Spinal stenosis, lumbar region without neurogenic claudication: Secondary | ICD-10-CM | POA: Diagnosis not present

## 2017-11-20 DIAGNOSIS — E119 Type 2 diabetes mellitus without complications: Secondary | ICD-10-CM

## 2017-11-20 DIAGNOSIS — R488 Other symbolic dysfunctions: Secondary | ICD-10-CM | POA: Diagnosis not present

## 2017-11-20 DIAGNOSIS — R278 Other lack of coordination: Secondary | ICD-10-CM | POA: Diagnosis not present

## 2017-11-20 DIAGNOSIS — M6389 Disorders of muscle in diseases classified elsewhere, multiple sites: Secondary | ICD-10-CM | POA: Diagnosis not present

## 2017-11-20 NOTE — Progress Notes (Signed)
Patient ID: [name], male   DOB: 04-Jul-1941, 77 y.o.   MRN: [id number]  Provider:  Rexene Edison. Mariea Clonts, D.O., C.M.D. Location:  [place] Room Number: 120 Place of Service:  SNF (31)  PCP: Gayland Curry, DO Patient Care Team: Gayland Curry, DO as PCP - General (Geriatric Medicine)  Extended Emergency Contact Information Primary Emergency Contact: Petrilla,[name] Address: [address]          [place], [place] [address] [name] of [place] Phone: [phone] Mobile Phone: [phone] Relation: Spouse  Code Status: DNR Goals of Care: Advanced Directive information Advanced Directives 11/20/2017  Does Patient Have a Medical Advance Directive? Yes  Type of Advance Directive Out of facility DNR (pink MOST or yellow form);[place];Living will  Does patient want to make changes to medical advance directive? No - Patient declined  Copy of [place] in Chart? Yes  Would patient like information on creating a medical advance directive? No - Patient declined  Pre-existing out of facility DNR order (yellow form or pink MOST form) Yellow form placed in chart (order not valid for inpatient use)    Chief Complaint  Patient presents with  . Establish Care    new patient  . New Admit To SNF    skilled admission    HPI: Patient is a 77 y.o. male seen today for admission to [facility] from [place] due to progressive dementia (felt to be mix of vascular, head trauma and Alzheimer's disease) with gait apraxia, well controlled DMII on metformin, severe obstructive sleep apnea on CPAP, nephrolithiasis, CKD3 (last gfr 44 11/12/17), depression, cervical and lumbar spinal stenosis (prior cervical epidurals w/o successful pain relief), BPH, and HTN.  Of note, he's been followed by the [facility], Dr. Laurelyn Sickle, and has also been seen at [facility] neurology, Dr. Nicki Reaper.  He had a workup for  NPH due to dilated ventricles on his brain imaging, but trial of drain in 2017 for this was ineffective.  He's also had difficulty with hematuria with past nephrolithiasis followed at [facility].  He had a workup in March last year for the hematuria that was unremarkable.  At his latest visit to the ED, hematuria was noted earlier this month, but culture showed no growth.  He is for a follow-up renal US in 10/15/18 with urology.    Functionally, he has been declining over the past several months.  He and [place] lived [place] in an [place], but he's had increased difficulty with gait apraxia and is fully dependent in ADLs except feeding.  He scored 3/30 on his MMSE at admission, failing his clock drawing (2 vertical lines in the middle of the circle).  He'd been managing with his scooter and staff and family using a gait belt for transfers and ambulation.  He was falling frequently and security and the nurse managers were getting frequent calls to his [place] apt to pick him up off of the floor.  He was admitted to skilled here at [facility] on 11/15/17.  He remains on aricept, namenda and sertraline for his depressive symptoms (his wife noted prior improvement with this).  He is working with PT, OT here.    Just prior to this admission, he was seen in the ED (as mentioned above), due to increased weakness and fatigue, "foul-smelling urine" and fever of 101 at home--Dr. Ardeth Perfect had sent him from his office (previous PCP).  Workup there was negative for pneumonia, influenza or cardiac event.  He was found to have some red cells in his urine.  He was given a single dose of IM rocephin and the ED physician ordered keflex 1000mg  po bid for UTI 11/15/17 which was finished yesterday, 11/19/17.  Urine culture was negative for growth.    FRAIL-NH Score Fatigue: 1 Resistance: 2 Ambulation : 1 Incontinence : 2 Loss of Weight: 0 Nutritional Approach : 0 Help with Dressing: 2  Total: 8 (11/20/2017 12:43  PM)   Past Medical History:  Diagnosis Date  . Abnormality of gait 04/05/2015  . Benign prostatic hypertrophy   . Dementia   . Diabetes mellitus type II    no meds  . Elevated PSA   . Erectile dysfunction   . Gout   . Hereditary and idiopathic peripheral neuropathy 08/02/2016  . Hypertension   . Memory changes   . Memory difficulties 04/05/2015  . Nephrolithiasis    hx of  . Obstructive sleep apnea   . Partial Achilles tendon tear   . Rosacea, acne   . Tremor 04/05/2015   Past Surgical History:  Procedure Laterality Date  . CARPAL TUNNEL RELEASE  2004  . CARPAL TUNNEL RELEASE  10/31/2011   Procedure: CARPAL TUNNEL RELEASE;  Surgeon: Cammie Sickle., MD;  Location: [facility];  Service: Orthopedics;  Laterality: Left;  . EYE SURGERY     cataracts  . HAND SURGERY  2007   right hand  . INGUINAL HERNIA REPAIR    . JOINT REPLACEMENT     left total knee  . KNEE SURGERY  1961   left knee  . TONSILLECTOMY    . TONSILLECTOMY AND ADENOIDECTOMY    . TRANSURETHRAL RESECTION OF PROSTATE      reports that  has never smoked. he has never used smokeless tobacco. He reports that he does not drink alcohol or use drugs. Social History   Socioeconomic History  . Marital status: Married    Spouse name: Not on file  . Number of children: 1  . Years of education: MBA  . Highest education level: Not on file  Social Needs  . Financial resource strain: Not on file  . Food insecurity - worry: Not on file  . Food insecurity - inability: Not on file  . Transportation needs - medical: Not on file  . Transportation needs - non-medical: Not on file  Occupational History    Employer: RETIRED  Tobacco Use  . Smoking status: Never Smoker  . Smokeless tobacco: Never Used  Substance and Sexual Activity  . Alcohol use: No    Comment: rarely  . Drug use: No  . Sexual activity: Not on file  Other Topics Concern  . Not on file  Social History Narrative   Regular exercise.     Avoid Ibuprofen, naproxen, NSAIDS.   Designated Party Release signed on 03/03/2010.      Lives at [place] w/ his wife   Patient drinks about 4-5 cups of caffeine daily.   Patient is right handed.  Clinical Intake:  Activities of Daily Living: (!) Dependent Feeding: Independent Dressing/Grooming: Needs assistance Bathing: Needs assistance Toileting: Needs assistance Ambulation: Needs Assistance Medication Administration: Dependent Is this a change from baseline?: Pre-admission baseline Home Management: Dependent  Barriers to Care Management & Learning: Cognitive impairments, Motor deficits   Family History  Problem Relation Age of Onset  . Heart disease Mother   . Dementia Sister   .  COPD Sister   . Diabetes Sister   . Thalassemia Son   . Heart disease Paternal Grandfather   . Dementia Cousin   . Colon cancer Neg Hx   . Stomach cancer Neg Hx     Health Maintenance  Topic Date Due  . FOOT EXAM  12/10/1950  . OPHTHALMOLOGY EXAM  12/10/1948  . PNA vac Low Risk Adult (2 of 2 - PCV13) 03/12/2013  . HEMOGLOBIN A1C  01/08/2014  . COLONOSCOPY  02/18/2017  . TETANUS/TDAP  03/12/2022  . INFLUENZA VACCINE  Completed    Allergies  Allergen Reactions  . Ciprofloxacin     REACTION: Diarrhea,l rectal irritation    Outpatient Encounter Medications as of 11/20/2017  Medication Sig  . allopurinol (ZYLOPRIM) 100 MG tablet Take 100 mg by mouth daily.   Marland Kitchen aspirin 81 MG tablet Take 81 mg by mouth daily.  . Cholecalciferol (VITAMIN D-3 PO) Take 2,000 Units by mouth daily.   Marland Kitchen donepezil (ARICEPT) 10 MG tablet Take 10 mg by mouth at bedtime.  . finasteride (PROSCAR) 5 MG tablet Take 5 mg by mouth at bedtime.   . IPRATROPIUM BROMIDE HFA IN Place 1 spray into both nostrils 3 (three) times daily.  Marland Kitchen lisinopril (PRINIVIL,ZESTRIL) 40 MG tablet Take 40 mg by mouth daily.   . memantine (NAMENDA) 10 MG tablet Take 1 tablet by mouth 2 (two) times daily.  . metFORMIN (GLUCOPHAGE) 500  MG tablet Take 500 mg by mouth daily with breakfast.  . pantoprazole (PROTONIX) 40 MG tablet Take 40 mg by mouth daily.   . sertraline (ZOLOFT) 50 MG tablet Take 50 mg by mouth daily.  . [DISCONTINUED] acetaminophen (TYLENOL) 650 MG CR tablet Take 1,300 mg by mouth as needed for pain.  . [DISCONTINUED] allopurinol (ZYLOPRIM) 300 MG tablet Take 300 mg by mouth daily.  . [DISCONTINUED] amLODipine (NORVASC) 10 MG tablet Take 1 tablet (10 mg total) by mouth daily.  . [DISCONTINUED] amLODipine (NORVASC) 10 MG tablet Take 10 mg by mouth daily.  . [DISCONTINUED] aspirin EC 81 MG tablet Take 81 mg by mouth daily.  . [DISCONTINUED] cephALEXin (KEFLEX) 500 MG capsule Take 2 capsules (1,000 mg total) by mouth 2 (two) times daily.  . [DISCONTINUED] diclofenac sodium (VOLTAREN) 1 % GEL   . [DISCONTINUED] donepezil (ARICEPT) 5 MG tablet Take 5 mg by mouth at bedtime.  . [DISCONTINUED] EPINEPHrine (EPIPEN 2-PAK) 0.3 mg/0.3 mL IJ SOAJ injection Inject 0.3 mg into the muscle once.  . [DISCONTINUED] fluticasone (FLONASE) 50 MCG/ACT nasal spray as needed.  . [DISCONTINUED] lisinopril (PRINIVIL,ZESTRIL) 10 MG tablet Take 10 mg by mouth at bedtime.   . [DISCONTINUED] modafinil (PROVIGIL) 200 MG tablet Take 100-200 mg by mouth at bedtime as needed (sleep apnea).   . [DISCONTINUED] NON FORMULARY CPAP at NIGHT  . [DISCONTINUED] ondansetron (ZOFRAN) 4 MG tablet Take 1 tablet (4 mg total) by mouth every 8 (eight) hours as needed for nausea or vomiting. (Patient not taking: Reported on 11/12/2017)  . [DISCONTINUED] ONETOUCH VERIO test strip 1 each by Other route as directed.    No facility-administered encounter medications on file as of 11/20/2017.     Review of Systems  Constitutional: Negative for activity change, appetite change, fatigue and fever.  HENT: Negative for congestion, sore throat and trouble swallowing.   Eyes: Negative for visual disturbance.  Respiratory: Negative for cough, chest tightness and  shortness of breath.   Cardiovascular: Negative for chest pain and leg swelling.  Gastrointestinal: Negative for abdominal pain, blood  in stool and diarrhea.  Genitourinary: Negative for dysuria.       Incontinence  Musculoskeletal: Positive for gait problem. Negative for arthralgias, back pain and joint swelling.       Falls  Skin: Negative for color change.  Neurological: Positive for weakness. Negative for dizziness.  Psychiatric/Behavioral: Positive for confusion. Negative for sleep disturbance and suicidal ideas. The patient is not nervous/anxious.     Vitals:   11/20/17 1054  BP: 140/72  Pulse: (!) 58  Resp: 16  Temp: (!) 97 F (36.1 C)  TempSrc: Oral  SpO2: 99%  Weight: 294 lb (133.4 kg)   Body mass index is 36.75 kg/m. Physical Exam  Constitutional: He appears well-developed and well-nourished. No distress.  HENT:  Head: Normocephalic and atraumatic.  Right Ear: External ear normal.  Left Ear: External ear normal.  Nose: Nose normal.  Mouth/Throat: Oropharynx is clear and moist. No oropharyngeal exudate.  Eyes: Conjunctivae and EOM are normal. Pupils are equal, round, and reactive to light.  Neck: Neck supple. No JVD present.  Cardiovascular: Normal rate, regular rhythm, normal heart sounds and intact distal pulses.  Pulmonary/Chest: Effort normal and breath sounds normal. No respiratory distress.  Abdominal: Soft. Bowel sounds are normal. He exhibits no distension and no mass. There is no tenderness. There is no rebound and no guarding.  Musculoskeletal: Normal range of motion.  Lymphadenopathy:    He has no cervical adenopathy.  Neurological: He is alert.  Oriented to person, prominent word-finding difficulty, only answers some questions  Skin: Skin is warm and dry.  Psychiatric: He has a normal mood and affect.    Labs reviewed: Basic Metabolic Panel: Recent Labs    11/12/17 1718  NA 138  K 4.0  CL 105  CO2 25  GLUCOSE 182*  BUN 18  CREATININE  1.47*  CALCIUM 8.8*   Liver Function Tests: Recent Labs    11/12/17 1718  AST 24  ALT 20  ALKPHOS 72  BILITOT 0.5  PROT 6.5  ALBUMIN 3.8   No results for input(s): LIPASE, AMYLASE in the last 8760 hours. No results for input(s): AMMONIA in the last 8760 hours. CBC: Recent Labs    11/12/17 1718  WBC 7.8  NEUTROABS 4.7  HGB 11.3*  HCT 34.8*  MCV 65.3*  PLT 172   Cardiac Enzymes: No results for input(s): CKTOTAL, CKMB, CKMBINDEX, TROPONINI in the last 8760 hours. BNP: Invalid input(s): POCBNP Lab Results  Component Value Date   HGBA1C 5.8 07/10/2013   Lab Results  Component Value Date   TSH 0.51 10/01/2012   Lab Results  Component Value Date   AJOINOMV67 209 04/05/2015   No results found for: FOLATE No results found for: IRON, TIBC, FERRITIN  Imaging and Procedures obtained prior to SNF admission: Dg Chest 2 View  Result Date: 11/12/2017 CLINICAL DATA:  Cough and fever EXAM: CHEST  2 VIEW COMPARISON:  Mar 08, 2006 FINDINGS: There is no edema or consolidation. Heart is upper normal in size with pulmonary vascularity within normal limits. No adenopathy. No evident bone lesions. IMPRESSION: No edema or consolidation. Electronically Signed   By: Lowella Grip III M.D.   On: 11/12/2017 19:01    Assessment/Plan 1. Mixed Alzheimer's and vascular dementia -advanced stage with recent increased falls and adl dependence requiring move to SNF -continues on aricept and namenda  2. Benign hypertension with chronic kidney disease, stage III ([place]) -bp at goal with lisinopril   3. Severe obstructive sleep apnea -cont CPAP  4. Diabetes mellitus type 2 without retinopathy ([place]) -cont baby asa, metformin, ace and monitor -check hba1c  5. Cervical spondylosis with myelopathy -ongoing, played football and several other sports -previously used tylenol for pain, but has not been c/o pains  6. Benign non-nodular prostatic hyperplasia with lower urinary tract  symptoms -cont proscar, f/u with urology as planned Jan of next year unless hematuria become more problematic or stones recur sooner  7. Chronic kidney disease, stage III (moderate) (H[facility]) -cont to monitor, Avoid nephrotoxic agents like nsaids, dose adjust renally excreted meds, hydrate.  8. Recurrent nephrolithiasis -has caused hematuria historically, followed by urology (hpi)  9. Reactive depression -is on low dose zoloft at 50mg  daily, monitor  10. Gait apraxia -with worsening falls due to his dementia, cont wheelchair, PT, OT involvement  11. Spinal stenosis of lumbar region at multiple levels -no current pain complaints, due to his history of playing sports  12. Constipation by delayed colonic transit -bowels moving w/o medication at this time  Family/ staff Communication: discussed with SNF nursing; would like to meet his wife and discuss MOST form with her, but she was still busy with moving items, etc. At time of my visit  Labs/tests ordered:  hba1c next draw  [place]. Vermell Madrid, D.O. [facility] [address], [place] [address] Cell Phone (Mon-Fri 8am-5pm):  [phone] On Call:  [phone] & follow prompts after 5pm & weekends Office Phone:  [phone] Office Fax:  [phone]

## 2017-11-21 DIAGNOSIS — R278 Other lack of coordination: Secondary | ICD-10-CM | POA: Diagnosis not present

## 2017-11-21 DIAGNOSIS — M6389 Disorders of muscle in diseases classified elsewhere, multiple sites: Secondary | ICD-10-CM | POA: Diagnosis not present

## 2017-11-21 DIAGNOSIS — R482 Apraxia: Secondary | ICD-10-CM | POA: Diagnosis not present

## 2017-11-21 DIAGNOSIS — R4701 Aphasia: Secondary | ICD-10-CM | POA: Diagnosis not present

## 2017-11-21 DIAGNOSIS — D649 Anemia, unspecified: Secondary | ICD-10-CM | POA: Diagnosis not present

## 2017-11-21 DIAGNOSIS — R488 Other symbolic dysfunctions: Secondary | ICD-10-CM | POA: Diagnosis not present

## 2017-11-21 DIAGNOSIS — I10 Essential (primary) hypertension: Secondary | ICD-10-CM | POA: Diagnosis not present

## 2017-11-21 DIAGNOSIS — Z79899 Other long term (current) drug therapy: Secondary | ICD-10-CM | POA: Diagnosis not present

## 2017-11-21 DIAGNOSIS — E119 Type 2 diabetes mellitus without complications: Secondary | ICD-10-CM | POA: Diagnosis not present

## 2017-11-21 DIAGNOSIS — R2689 Other abnormalities of gait and mobility: Secondary | ICD-10-CM | POA: Diagnosis not present

## 2017-11-21 LAB — BASIC METABOLIC PANEL
BUN: 22 — AB (ref 4–21)
BUN: 22 — AB (ref 4–21)
CREATININE: 1.4 — AB (ref 0.6–1.3)
Creatinine: 1.4 — AB (ref 0.6–1.3)
Glucose: 152
Glucose: 152
Potassium: 4.7 (ref 3.4–5.3)
Potassium: 4.7 (ref 3.4–5.3)
Sodium: 143 (ref 137–147)
Sodium: 143 (ref 137–147)

## 2017-11-21 LAB — HEMOGLOBIN A1C
Hemoglobin A1C: 7.8
Hemoglobin A1C: 7.8

## 2017-11-22 DIAGNOSIS — R2689 Other abnormalities of gait and mobility: Secondary | ICD-10-CM | POA: Diagnosis not present

## 2017-11-22 DIAGNOSIS — R4701 Aphasia: Secondary | ICD-10-CM | POA: Diagnosis not present

## 2017-11-22 DIAGNOSIS — M6389 Disorders of muscle in diseases classified elsewhere, multiple sites: Secondary | ICD-10-CM | POA: Diagnosis not present

## 2017-11-22 DIAGNOSIS — R488 Other symbolic dysfunctions: Secondary | ICD-10-CM | POA: Diagnosis not present

## 2017-11-22 DIAGNOSIS — R278 Other lack of coordination: Secondary | ICD-10-CM | POA: Diagnosis not present

## 2017-11-22 DIAGNOSIS — R482 Apraxia: Secondary | ICD-10-CM | POA: Diagnosis not present

## 2017-11-26 DIAGNOSIS — R482 Apraxia: Secondary | ICD-10-CM | POA: Diagnosis not present

## 2017-11-26 DIAGNOSIS — R488 Other symbolic dysfunctions: Secondary | ICD-10-CM | POA: Diagnosis not present

## 2017-11-26 DIAGNOSIS — R278 Other lack of coordination: Secondary | ICD-10-CM | POA: Diagnosis not present

## 2017-11-26 DIAGNOSIS — R2689 Other abnormalities of gait and mobility: Secondary | ICD-10-CM | POA: Diagnosis not present

## 2017-11-26 DIAGNOSIS — R4701 Aphasia: Secondary | ICD-10-CM | POA: Diagnosis not present

## 2017-11-26 DIAGNOSIS — M6389 Disorders of muscle in diseases classified elsewhere, multiple sites: Secondary | ICD-10-CM | POA: Diagnosis not present

## 2017-11-27 DIAGNOSIS — R482 Apraxia: Secondary | ICD-10-CM | POA: Diagnosis not present

## 2017-11-27 DIAGNOSIS — R278 Other lack of coordination: Secondary | ICD-10-CM | POA: Diagnosis not present

## 2017-11-27 DIAGNOSIS — R4701 Aphasia: Secondary | ICD-10-CM | POA: Diagnosis not present

## 2017-11-27 DIAGNOSIS — R2689 Other abnormalities of gait and mobility: Secondary | ICD-10-CM | POA: Diagnosis not present

## 2017-11-27 DIAGNOSIS — R488 Other symbolic dysfunctions: Secondary | ICD-10-CM | POA: Diagnosis not present

## 2017-11-27 DIAGNOSIS — M6389 Disorders of muscle in diseases classified elsewhere, multiple sites: Secondary | ICD-10-CM | POA: Diagnosis not present

## 2017-11-28 DIAGNOSIS — R482 Apraxia: Secondary | ICD-10-CM | POA: Diagnosis not present

## 2017-11-28 DIAGNOSIS — M6389 Disorders of muscle in diseases classified elsewhere, multiple sites: Secondary | ICD-10-CM | POA: Diagnosis not present

## 2017-11-28 DIAGNOSIS — R488 Other symbolic dysfunctions: Secondary | ICD-10-CM | POA: Diagnosis not present

## 2017-11-28 DIAGNOSIS — R4701 Aphasia: Secondary | ICD-10-CM | POA: Diagnosis not present

## 2017-11-28 DIAGNOSIS — R2689 Other abnormalities of gait and mobility: Secondary | ICD-10-CM | POA: Diagnosis not present

## 2017-11-28 DIAGNOSIS — R278 Other lack of coordination: Secondary | ICD-10-CM | POA: Diagnosis not present

## 2017-11-29 DIAGNOSIS — R2689 Other abnormalities of gait and mobility: Secondary | ICD-10-CM | POA: Diagnosis not present

## 2017-11-29 DIAGNOSIS — R4701 Aphasia: Secondary | ICD-10-CM | POA: Diagnosis not present

## 2017-11-29 DIAGNOSIS — R488 Other symbolic dysfunctions: Secondary | ICD-10-CM | POA: Diagnosis not present

## 2017-11-29 DIAGNOSIS — R482 Apraxia: Secondary | ICD-10-CM | POA: Diagnosis not present

## 2017-11-29 DIAGNOSIS — R278 Other lack of coordination: Secondary | ICD-10-CM | POA: Diagnosis not present

## 2017-11-29 DIAGNOSIS — M6389 Disorders of muscle in diseases classified elsewhere, multiple sites: Secondary | ICD-10-CM | POA: Diagnosis not present

## 2017-11-30 DIAGNOSIS — M6389 Disorders of muscle in diseases classified elsewhere, multiple sites: Secondary | ICD-10-CM | POA: Diagnosis not present

## 2017-11-30 DIAGNOSIS — R278 Other lack of coordination: Secondary | ICD-10-CM | POA: Diagnosis not present

## 2017-11-30 DIAGNOSIS — R2689 Other abnormalities of gait and mobility: Secondary | ICD-10-CM | POA: Diagnosis not present

## 2017-11-30 DIAGNOSIS — R482 Apraxia: Secondary | ICD-10-CM | POA: Diagnosis not present

## 2017-11-30 DIAGNOSIS — R4701 Aphasia: Secondary | ICD-10-CM | POA: Diagnosis not present

## 2017-11-30 DIAGNOSIS — R488 Other symbolic dysfunctions: Secondary | ICD-10-CM | POA: Diagnosis not present

## 2017-12-03 DIAGNOSIS — R2689 Other abnormalities of gait and mobility: Secondary | ICD-10-CM | POA: Diagnosis not present

## 2017-12-03 DIAGNOSIS — R488 Other symbolic dysfunctions: Secondary | ICD-10-CM | POA: Diagnosis not present

## 2017-12-03 DIAGNOSIS — R482 Apraxia: Secondary | ICD-10-CM | POA: Diagnosis not present

## 2017-12-03 DIAGNOSIS — R4701 Aphasia: Secondary | ICD-10-CM | POA: Diagnosis not present

## 2017-12-03 DIAGNOSIS — R278 Other lack of coordination: Secondary | ICD-10-CM | POA: Diagnosis not present

## 2017-12-03 DIAGNOSIS — M6389 Disorders of muscle in diseases classified elsewhere, multiple sites: Secondary | ICD-10-CM | POA: Diagnosis not present

## 2017-12-04 DIAGNOSIS — R278 Other lack of coordination: Secondary | ICD-10-CM | POA: Diagnosis not present

## 2017-12-04 DIAGNOSIS — M6389 Disorders of muscle in diseases classified elsewhere, multiple sites: Secondary | ICD-10-CM | POA: Diagnosis not present

## 2017-12-04 DIAGNOSIS — R482 Apraxia: Secondary | ICD-10-CM | POA: Diagnosis not present

## 2017-12-04 DIAGNOSIS — R4701 Aphasia: Secondary | ICD-10-CM | POA: Diagnosis not present

## 2017-12-04 DIAGNOSIS — R2689 Other abnormalities of gait and mobility: Secondary | ICD-10-CM | POA: Diagnosis not present

## 2017-12-04 DIAGNOSIS — R488 Other symbolic dysfunctions: Secondary | ICD-10-CM | POA: Diagnosis not present

## 2017-12-05 DIAGNOSIS — R4701 Aphasia: Secondary | ICD-10-CM | POA: Diagnosis not present

## 2017-12-05 DIAGNOSIS — R488 Other symbolic dysfunctions: Secondary | ICD-10-CM | POA: Diagnosis not present

## 2017-12-05 DIAGNOSIS — R278 Other lack of coordination: Secondary | ICD-10-CM | POA: Diagnosis not present

## 2017-12-05 DIAGNOSIS — R482 Apraxia: Secondary | ICD-10-CM | POA: Diagnosis not present

## 2017-12-05 DIAGNOSIS — M6389 Disorders of muscle in diseases classified elsewhere, multiple sites: Secondary | ICD-10-CM | POA: Diagnosis not present

## 2017-12-05 DIAGNOSIS — R2689 Other abnormalities of gait and mobility: Secondary | ICD-10-CM | POA: Diagnosis not present

## 2017-12-06 DIAGNOSIS — R488 Other symbolic dysfunctions: Secondary | ICD-10-CM | POA: Diagnosis not present

## 2017-12-06 DIAGNOSIS — M6389 Disorders of muscle in diseases classified elsewhere, multiple sites: Secondary | ICD-10-CM | POA: Diagnosis not present

## 2017-12-06 DIAGNOSIS — R482 Apraxia: Secondary | ICD-10-CM | POA: Diagnosis not present

## 2017-12-06 DIAGNOSIS — R278 Other lack of coordination: Secondary | ICD-10-CM | POA: Diagnosis not present

## 2017-12-06 DIAGNOSIS — R4701 Aphasia: Secondary | ICD-10-CM | POA: Diagnosis not present

## 2017-12-06 DIAGNOSIS — R2689 Other abnormalities of gait and mobility: Secondary | ICD-10-CM | POA: Diagnosis not present

## 2017-12-07 DIAGNOSIS — M6389 Disorders of muscle in diseases classified elsewhere, multiple sites: Secondary | ICD-10-CM | POA: Diagnosis not present

## 2017-12-07 DIAGNOSIS — F0280 Dementia in other diseases classified elsewhere without behavioral disturbance: Secondary | ICD-10-CM | POA: Diagnosis not present

## 2017-12-07 DIAGNOSIS — R2681 Unsteadiness on feet: Secondary | ICD-10-CM | POA: Diagnosis not present

## 2017-12-07 DIAGNOSIS — G609 Hereditary and idiopathic neuropathy, unspecified: Secondary | ICD-10-CM | POA: Diagnosis not present

## 2017-12-07 DIAGNOSIS — F0150 Vascular dementia without behavioral disturbance: Secondary | ICD-10-CM | POA: Diagnosis not present

## 2017-12-07 DIAGNOSIS — R4701 Aphasia: Secondary | ICD-10-CM | POA: Diagnosis not present

## 2017-12-07 DIAGNOSIS — G301 Alzheimer's disease with late onset: Secondary | ICD-10-CM | POA: Diagnosis not present

## 2017-12-07 DIAGNOSIS — F0390 Unspecified dementia without behavioral disturbance: Secondary | ICD-10-CM | POA: Diagnosis not present

## 2017-12-07 DIAGNOSIS — M48061 Spinal stenosis, lumbar region without neurogenic claudication: Secondary | ICD-10-CM | POA: Diagnosis not present

## 2017-12-07 DIAGNOSIS — R488 Other symbolic dysfunctions: Secondary | ICD-10-CM | POA: Diagnosis not present

## 2017-12-07 DIAGNOSIS — R482 Apraxia: Secondary | ICD-10-CM | POA: Diagnosis not present

## 2017-12-07 DIAGNOSIS — R413 Other amnesia: Secondary | ICD-10-CM | POA: Diagnosis not present

## 2017-12-07 DIAGNOSIS — G912 (Idiopathic) normal pressure hydrocephalus: Secondary | ICD-10-CM | POA: Diagnosis not present

## 2017-12-07 DIAGNOSIS — R278 Other lack of coordination: Secondary | ICD-10-CM | POA: Diagnosis not present

## 2017-12-07 DIAGNOSIS — R296 Repeated falls: Secondary | ICD-10-CM | POA: Diagnosis not present

## 2017-12-10 ENCOUNTER — Non-Acute Institutional Stay (SKILLED_NURSING_FACILITY): Payer: Medicare Other | Admitting: Adult Health

## 2017-12-10 ENCOUNTER — Encounter: Payer: Self-pay | Admitting: Adult Health

## 2017-12-10 DIAGNOSIS — F329 Major depressive disorder, single episode, unspecified: Secondary | ICD-10-CM

## 2017-12-10 DIAGNOSIS — E119 Type 2 diabetes mellitus without complications: Secondary | ICD-10-CM

## 2017-12-10 DIAGNOSIS — N183 Chronic kidney disease, stage 3 (moderate): Secondary | ICD-10-CM | POA: Diagnosis not present

## 2017-12-10 DIAGNOSIS — I129 Hypertensive chronic kidney disease with stage 1 through stage 4 chronic kidney disease, or unspecified chronic kidney disease: Secondary | ICD-10-CM

## 2017-12-10 NOTE — Progress Notes (Signed)
Location:  [facility] of Service:  SNF (31) Provider:   Cindi Carbon, ANP [place] [phone]   Gayland Curry, DO  Patient Care Team: Gayland Curry, DO as PCP - General (Geriatric Medicine)  Extended Emergency Contact Information Primary Emergency Contact: Deneault,[name] Address: [address]          [place], [place] [address] [name] of [place] Phone: [phone] Mobile Phone: [phone] Relation: Spouse  Code Status:  DNR Goals of care: Advanced Directive information Advanced Directives 11/20/2017  Does Patient Have a Medical Advance Directive? Yes  Type of Advance Directive Out of facility DNR (pink MOST or yellow form);[place];Living will  Does patient want to make changes to medical advance directive? No - Patient declined  Copy of [place] in Chart? Yes  Would patient like information on creating a medical advance directive? No - Patient declined  Pre-existing out of facility DNR order (yellow form or pink MOST form) Yellow form placed in chart (order not valid for inpatient use)     Chief Complaint  Patient presents with  . Acute Visit    depression, htn    HPI:  Pt is a 77 y.o. male seen today for an acute visit for depression and htn at the request of his wife. He moved to skilled care due to progressive dementia which was felt to be mixed type one month ago.  She states that since arriving he has been talking less and less and not engaging with staff or activities. He was place on zoloft at a small dose for depression months ago and she wonders if he can have an increased dose. Mr. Chiasson himself denies symptoms of depression during our visit such as decreased appetite, anxiety, sadness, withdrawal from social activities etc. The adjustment has been difficult for his wife and she is quite stressed about this transition and her role as his caretaker.    In  addition, several of his BPs have been elevated up to the 160's at times. He was place on norvasc for htn last year by cardiology but at some point it dropped off his med list.   MMSE 3/30 failed clock  DMII  Lab Results  Component Value Date   HGBA1C 7.8 11/21/2017    Past Medical History:  Diagnosis Date  . Abnormality of gait 04/05/2015  . Benign prostatic hypertrophy   . Dementia   . Diabetes mellitus type II    no meds  . Elevated PSA   . Erectile dysfunction   . Gout   . Hereditary and idiopathic peripheral neuropathy 08/02/2016  . Hypertension   . Memory changes   . Memory difficulties 04/05/2015  . Nephrolithiasis    hx of  . Obstructive sleep apnea   . Partial Achilles tendon tear   . Rosacea, acne   . Tremor 04/05/2015   Past Surgical History:  Procedure Laterality Date  . CARPAL TUNNEL RELEASE  2004  . CARPAL TUNNEL RELEASE  10/31/2011   Procedure: CARPAL TUNNEL RELEASE;  Surgeon: Cammie Sickle., MD;  Location: [facility];  Service: Orthopedics;  Laterality: Left;  . EYE SURGERY     cataracts  . HAND SURGERY  2007   right hand  . INGUINAL HERNIA REPAIR    . JOINT REPLACEMENT     left total knee  . KNEE SURGERY  1961   left knee  . TONSILLECTOMY    .  TONSILLECTOMY AND ADENOIDECTOMY    . TRANSURETHRAL RESECTION OF PROSTATE      Allergies  Allergen Reactions  . Ciprofloxacin     REACTION: Diarrhea,l rectal irritation    Outpatient Encounter Medications as of 12/10/2017  Medication Sig  . allopurinol (ZYLOPRIM) 100 MG tablet Take 100 mg by mouth daily.   Marland Kitchen aspirin 81 MG tablet Take 81 mg by mouth daily.  . Cholecalciferol (VITAMIN D-3 PO) Take 2,000 Units by mouth daily.   Marland Kitchen donepezil (ARICEPT) 10 MG tablet Take 10 mg by mouth at bedtime.  . finasteride (PROSCAR) 5 MG tablet Take 5 mg by mouth at bedtime.   . IPRATROPIUM BROMIDE HFA IN Place 1 spray into both nostrils 3 (three) times daily.  . memantine (NAMENDA) 10 MG tablet Take 1  tablet by mouth 2 (two) times daily.  . metFORMIN (GLUCOPHAGE) 500 MG tablet Take 500 mg by mouth daily with breakfast.  . pantoprazole (PROTONIX) 40 MG tablet Take 40 mg by mouth daily.   . sertraline (ZOLOFT) 50 MG tablet Take 50 mg by mouth daily.  Marland Kitchen lisinopril (PRINIVIL,ZESTRIL) 40 MG tablet Take 40 mg by mouth daily.    No facility-administered encounter medications on file as of 12/10/2017.     Review of Systems  Constitutional: Positive for activity change. Negative for appetite change, chills, diaphoresis, fatigue, fever and unexpected weight change.  Respiratory: Negative for cough, shortness of breath, wheezing and stridor.   Cardiovascular: Positive for leg swelling. Negative for chest pain and palpitations.  Gastrointestinal: Negative for abdominal distention, abdominal pain, constipation and diarrhea.  Genitourinary: Negative for difficulty urinating and dysuria.  Musculoskeletal: Positive for gait problem. Negative for arthralgias, back pain, joint swelling and myalgias.  Neurological: Positive for weakness. Negative for dizziness, seizures, syncope, facial asymmetry, speech difficulty and headaches.  Hematological: Negative for adenopathy. Does not bruise/bleed easily.  Psychiatric/Behavioral: Positive for confusion and dysphoric mood. Negative for agitation, behavioral problems, hallucinations, self-injury, sleep disturbance and suicidal ideas. The patient is not nervous/anxious and is not hyperactive.     Immunization History  Administered Date(s) Administered  . Influenza Split 08/18/2011, 08/08/2012  . Influenza Whole 08/21/2006, 07/11/2007, 09/13/2009, 07/13/2010  . Influenza, Seasonal, Injecte, Preservative Fre 07/10/2013  . Influenza-Unspecified 08/08/2012, 07/17/2014, 08/10/2014, 06/16/2015, 07/30/2017  . Pneumococcal Conjugate-13 01/06/2014  . Pneumococcal Polysaccharide-23 03/12/2012, 03/10/2014  . Td 10/09/1992  . Tdap 03/12/2012, 01/08/2014  . Zoster  12/26/2007, 03/10/2014   Pertinent  Health Maintenance Due  Topic Date Due  . FOOT EXAM  06/15/2016  . OPHTHALMOLOGY EXAM  12/12/2016  . COLONOSCOPY  02/18/2017  . HEMOGLOBIN A1C  05/21/2018  . INFLUENZA VACCINE  Completed  . PNA vac Low Risk Adult  Completed   No flowsheet data found. Functional Status Survey:    Vitals:   12/10/17 1343  BP: (!) 148/70  Pulse: 72  Weight: 280 lb (127 kg)   Body mass index is 35 kg/m. Physical Exam  Constitutional: No distress.  HENT:  Head: Normocephalic and atraumatic.  Neck: No JVD present. No thyromegaly present.  Cardiovascular: Normal rate and regular rhythm.  No murmur heard. Trace edema to both feet  Pulmonary/Chest: Effort normal and breath sounds normal. No respiratory distress. He has no wheezes.  Abdominal: Soft. Bowel sounds are normal. He exhibits no distension. There is no tenderness.  Lymphadenopathy:    He has no cervical adenopathy.  Neurological: He is alert.  Oriented to self only. Able to follow commands and answer q's  Skin: Skin is warm  and dry. He is not diaphoretic.  Psychiatric: He has a normal mood and affect.    Labs reviewed: Recent Labs    11/12/17 1718 11/21/17  NA 138 143  K 4.0 4.7  CL 105  --   CO2 25  --   GLUCOSE 182*  --   BUN 18 22*  CREATININE 1.47* 1.4*  CALCIUM 8.8*  --    Recent Labs    11/12/17 1718  AST 24  ALT 20  ALKPHOS 72  BILITOT 0.5  PROT 6.5  ALBUMIN 3.8   Recent Labs    11/12/17 1718  WBC 7.8  NEUTROABS 4.7  HGB 11.3*  HCT 34.8*  MCV 65.3*  PLT 172   Lab Results  Component Value Date   TSH 0.51 10/01/2012   Lab Results  Component Value Date   HGBA1C 7.8 11/21/2017   Lab Results  Component Value Date   CHOL 151 11/11/2013   HDL 33.00 (L) 11/11/2013   LDLCALC 96 11/11/2013   LDLDIRECT 97.8 05/26/2009   TRIG 112.0 11/11/2013   CHOLHDL 5 11/11/2013    Significant Diagnostic Results in last 30 days:  Dg Chest 2 View  Result Date:  11/12/2017 CLINICAL DATA:  Cough and fever EXAM: CHEST  2 VIEW COMPARISON:  Mar 08, 2006 FINDINGS: There is no edema or consolidation. Heart is upper normal in size with pulmonary vascularity within normal limits. No adenopathy. No evident bone lesions. IMPRESSION: No edema or consolidation. Electronically Signed   By: Lowella Grip III M.D.   On: 11/12/2017 19:01    Assessment/Plan  1. Reactive depression Increase Zoloft 75 mg qd  2. Benign hypertension with chronic kidney disease, stage III ([facility]) Norvasc 5 mg qd  BP daily for 1 week Monitor BMP periodically   3. Diabetes mellitus type 2 without retinopathy ([place]) Check CBGS twice weekly Consider med change due to renal dz Goal A1C should be less than 8% due to his age/debility    Family/ staff Communication: discussed with his wife  Labs/tests ordered:  NA

## 2017-12-11 DIAGNOSIS — F0150 Vascular dementia without behavioral disturbance: Secondary | ICD-10-CM | POA: Diagnosis not present

## 2017-12-11 DIAGNOSIS — R482 Apraxia: Secondary | ICD-10-CM | POA: Diagnosis not present

## 2017-12-11 DIAGNOSIS — R278 Other lack of coordination: Secondary | ICD-10-CM | POA: Diagnosis not present

## 2017-12-11 DIAGNOSIS — R4701 Aphasia: Secondary | ICD-10-CM | POA: Diagnosis not present

## 2017-12-11 DIAGNOSIS — M6389 Disorders of muscle in diseases classified elsewhere, multiple sites: Secondary | ICD-10-CM | POA: Diagnosis not present

## 2017-12-11 DIAGNOSIS — R488 Other symbolic dysfunctions: Secondary | ICD-10-CM | POA: Diagnosis not present

## 2017-12-13 DIAGNOSIS — R4701 Aphasia: Secondary | ICD-10-CM | POA: Diagnosis not present

## 2017-12-13 DIAGNOSIS — M6389 Disorders of muscle in diseases classified elsewhere, multiple sites: Secondary | ICD-10-CM | POA: Diagnosis not present

## 2017-12-13 DIAGNOSIS — R482 Apraxia: Secondary | ICD-10-CM | POA: Diagnosis not present

## 2017-12-13 DIAGNOSIS — R278 Other lack of coordination: Secondary | ICD-10-CM | POA: Diagnosis not present

## 2017-12-13 DIAGNOSIS — F0150 Vascular dementia without behavioral disturbance: Secondary | ICD-10-CM | POA: Diagnosis not present

## 2017-12-13 DIAGNOSIS — R488 Other symbolic dysfunctions: Secondary | ICD-10-CM | POA: Diagnosis not present

## 2017-12-14 DIAGNOSIS — R278 Other lack of coordination: Secondary | ICD-10-CM | POA: Diagnosis not present

## 2017-12-14 DIAGNOSIS — R482 Apraxia: Secondary | ICD-10-CM | POA: Diagnosis not present

## 2017-12-14 DIAGNOSIS — R4701 Aphasia: Secondary | ICD-10-CM | POA: Diagnosis not present

## 2017-12-14 DIAGNOSIS — M6389 Disorders of muscle in diseases classified elsewhere, multiple sites: Secondary | ICD-10-CM | POA: Diagnosis not present

## 2017-12-14 DIAGNOSIS — F0150 Vascular dementia without behavioral disturbance: Secondary | ICD-10-CM | POA: Diagnosis not present

## 2017-12-14 DIAGNOSIS — R488 Other symbolic dysfunctions: Secondary | ICD-10-CM | POA: Diagnosis not present

## 2017-12-15 DIAGNOSIS — F0150 Vascular dementia without behavioral disturbance: Secondary | ICD-10-CM | POA: Diagnosis not present

## 2017-12-15 DIAGNOSIS — R482 Apraxia: Secondary | ICD-10-CM | POA: Diagnosis not present

## 2017-12-15 DIAGNOSIS — R488 Other symbolic dysfunctions: Secondary | ICD-10-CM | POA: Diagnosis not present

## 2017-12-15 DIAGNOSIS — M6389 Disorders of muscle in diseases classified elsewhere, multiple sites: Secondary | ICD-10-CM | POA: Diagnosis not present

## 2017-12-15 DIAGNOSIS — R4701 Aphasia: Secondary | ICD-10-CM | POA: Diagnosis not present

## 2017-12-15 DIAGNOSIS — R278 Other lack of coordination: Secondary | ICD-10-CM | POA: Diagnosis not present

## 2017-12-18 DIAGNOSIS — M6389 Disorders of muscle in diseases classified elsewhere, multiple sites: Secondary | ICD-10-CM | POA: Diagnosis not present

## 2017-12-18 DIAGNOSIS — R482 Apraxia: Secondary | ICD-10-CM | POA: Diagnosis not present

## 2017-12-18 DIAGNOSIS — R4701 Aphasia: Secondary | ICD-10-CM | POA: Diagnosis not present

## 2017-12-18 DIAGNOSIS — R278 Other lack of coordination: Secondary | ICD-10-CM | POA: Diagnosis not present

## 2017-12-18 DIAGNOSIS — R488 Other symbolic dysfunctions: Secondary | ICD-10-CM | POA: Diagnosis not present

## 2017-12-18 DIAGNOSIS — F0150 Vascular dementia without behavioral disturbance: Secondary | ICD-10-CM | POA: Diagnosis not present

## 2017-12-19 DIAGNOSIS — R482 Apraxia: Secondary | ICD-10-CM | POA: Diagnosis not present

## 2017-12-19 DIAGNOSIS — R488 Other symbolic dysfunctions: Secondary | ICD-10-CM | POA: Diagnosis not present

## 2017-12-19 DIAGNOSIS — M6389 Disorders of muscle in diseases classified elsewhere, multiple sites: Secondary | ICD-10-CM | POA: Diagnosis not present

## 2017-12-19 DIAGNOSIS — F0150 Vascular dementia without behavioral disturbance: Secondary | ICD-10-CM | POA: Diagnosis not present

## 2017-12-19 DIAGNOSIS — R278 Other lack of coordination: Secondary | ICD-10-CM | POA: Diagnosis not present

## 2017-12-19 DIAGNOSIS — R4701 Aphasia: Secondary | ICD-10-CM | POA: Diagnosis not present

## 2017-12-21 DIAGNOSIS — R482 Apraxia: Secondary | ICD-10-CM | POA: Diagnosis not present

## 2017-12-21 DIAGNOSIS — M6389 Disorders of muscle in diseases classified elsewhere, multiple sites: Secondary | ICD-10-CM | POA: Diagnosis not present

## 2017-12-21 DIAGNOSIS — R4701 Aphasia: Secondary | ICD-10-CM | POA: Diagnosis not present

## 2017-12-21 DIAGNOSIS — F0150 Vascular dementia without behavioral disturbance: Secondary | ICD-10-CM | POA: Diagnosis not present

## 2017-12-21 DIAGNOSIS — R488 Other symbolic dysfunctions: Secondary | ICD-10-CM | POA: Diagnosis not present

## 2017-12-21 DIAGNOSIS — R278 Other lack of coordination: Secondary | ICD-10-CM | POA: Diagnosis not present

## 2017-12-24 DIAGNOSIS — R4701 Aphasia: Secondary | ICD-10-CM | POA: Diagnosis not present

## 2017-12-24 DIAGNOSIS — R482 Apraxia: Secondary | ICD-10-CM | POA: Diagnosis not present

## 2017-12-24 DIAGNOSIS — R278 Other lack of coordination: Secondary | ICD-10-CM | POA: Diagnosis not present

## 2017-12-24 DIAGNOSIS — R488 Other symbolic dysfunctions: Secondary | ICD-10-CM | POA: Diagnosis not present

## 2017-12-24 DIAGNOSIS — F0150 Vascular dementia without behavioral disturbance: Secondary | ICD-10-CM | POA: Diagnosis not present

## 2017-12-24 DIAGNOSIS — M6389 Disorders of muscle in diseases classified elsewhere, multiple sites: Secondary | ICD-10-CM | POA: Diagnosis not present

## 2017-12-25 DIAGNOSIS — R278 Other lack of coordination: Secondary | ICD-10-CM | POA: Diagnosis not present

## 2017-12-25 DIAGNOSIS — R4701 Aphasia: Secondary | ICD-10-CM | POA: Diagnosis not present

## 2017-12-25 DIAGNOSIS — M6389 Disorders of muscle in diseases classified elsewhere, multiple sites: Secondary | ICD-10-CM | POA: Diagnosis not present

## 2017-12-25 DIAGNOSIS — R482 Apraxia: Secondary | ICD-10-CM | POA: Diagnosis not present

## 2017-12-25 DIAGNOSIS — F0150 Vascular dementia without behavioral disturbance: Secondary | ICD-10-CM | POA: Diagnosis not present

## 2017-12-25 DIAGNOSIS — R488 Other symbolic dysfunctions: Secondary | ICD-10-CM | POA: Diagnosis not present

## 2017-12-27 DIAGNOSIS — R4701 Aphasia: Secondary | ICD-10-CM | POA: Diagnosis not present

## 2017-12-27 DIAGNOSIS — R488 Other symbolic dysfunctions: Secondary | ICD-10-CM | POA: Diagnosis not present

## 2017-12-27 DIAGNOSIS — M6389 Disorders of muscle in diseases classified elsewhere, multiple sites: Secondary | ICD-10-CM | POA: Diagnosis not present

## 2017-12-27 DIAGNOSIS — F0150 Vascular dementia without behavioral disturbance: Secondary | ICD-10-CM | POA: Diagnosis not present

## 2017-12-27 DIAGNOSIS — R278 Other lack of coordination: Secondary | ICD-10-CM | POA: Diagnosis not present

## 2017-12-27 DIAGNOSIS — R482 Apraxia: Secondary | ICD-10-CM | POA: Diagnosis not present

## 2018-01-01 ENCOUNTER — Non-Acute Institutional Stay (SKILLED_NURSING_FACILITY): Payer: Medicare Other | Admitting: Internal Medicine

## 2018-01-01 ENCOUNTER — Encounter: Payer: Self-pay | Admitting: Internal Medicine

## 2018-01-01 DIAGNOSIS — Z7189 Other specified counseling: Secondary | ICD-10-CM | POA: Diagnosis not present

## 2018-01-01 DIAGNOSIS — N183 Chronic kidney disease, stage 3 unspecified: Secondary | ICD-10-CM

## 2018-01-01 DIAGNOSIS — I129 Hypertensive chronic kidney disease with stage 1 through stage 4 chronic kidney disease, or unspecified chronic kidney disease: Secondary | ICD-10-CM

## 2018-01-01 DIAGNOSIS — G4733 Obstructive sleep apnea (adult) (pediatric): Secondary | ICD-10-CM | POA: Diagnosis not present

## 2018-01-01 DIAGNOSIS — G309 Alzheimer's disease, unspecified: Secondary | ICD-10-CM | POA: Diagnosis not present

## 2018-01-01 DIAGNOSIS — F0150 Vascular dementia without behavioral disturbance: Secondary | ICD-10-CM

## 2018-01-01 DIAGNOSIS — F0280 Dementia in other diseases classified elsewhere without behavioral disturbance: Secondary | ICD-10-CM

## 2018-01-01 DIAGNOSIS — E119 Type 2 diabetes mellitus without complications: Secondary | ICD-10-CM | POA: Diagnosis not present

## 2018-01-01 NOTE — Progress Notes (Signed)
Patient ID: [name], male   DOB: 01-04-1941, 77 y.o.   MRN: [id number]  Location:  [facility] Room Number: 120 Place of Service:  SNF ([phone]) Provider:   Gayland Curry, DO  Patient Care Team: Gayland Curry, DO as PCP - General (Geriatric Medicine)  Extended Emergency Contact Information Primary Emergency Contact: [name] Address: [address]          [place], [place] [address] [name] of [place] Phone: [phone] Mobile Phone: [phone] Relation: Spouse  Code Status:  DNR Goals of care: Advanced Directive information Advanced Directives 01/01/2018  Does Patient Have a Medical Advance Directive? Yes  Type of Advance Directive Out of facility DNR (pink MOST or yellow form);Living will;Healthcare Power of Attorney  Does patient want to make changes to medical advance directive? No - Patient declined  Copy of [place] in Chart? Yes  Would patient like information on creating a medical advance directive? No - Patient declined  Pre-existing out of facility DNR order (yellow form or pink MOST form) Yellow form placed in chart (order not valid for inpatient use)   Chief Complaint  Patient presents with  . Medical Management of Chronic Issues    Routine Visit    HPI:  Pt is a 77 y.o. male seen today for medical management of chronic diseases.  I met with his wife today to meet her for the first time.  She is concerned about [name] having some increased depression since his move to SNF from home.  He's been on increased zoloft to 56m daily since 12/10/17.  He reports needing something to do when sitting at the nurses' station by the TV.  His nurse then took him to an activity.  He is frustrated with his word-finding difficulties here.    BP was increased to 7.559mpo daily for HTN.    Staff reported no new concerns about him otherwise.  His wife also noted some concern about him getting cleaned  adequately due to his body habitus.  [name] also wants him to go back to check in with Dr. HeLaurelyn Sickleust one more time since he's followed him for years.    DNR form scanned to record.  DMII has been well controlled on his current regimen.  He does not have any retinopathy noted.  Will need annual eye exams here.  OSA continues on cpap.   He denies any current pain, but then moves his legs around after I asked him (? neuropathic pain)   Past Medical History:  Diagnosis Date  . Abnormality of gait 04/05/2015  . Benign prostatic hypertrophy   . Dementia   . Diabetes mellitus type II    no meds  . Elevated PSA   . Erectile dysfunction   . Gout   . Hereditary and idiopathic peripheral neuropathy 08/02/2016  . Hypertension   . Memory changes   . Memory difficulties 04/05/2015  . Nephrolithiasis    hx of  . Obstructive sleep apnea   . Partial Achilles tendon tear   . Rosacea, acne   . Tremor 04/05/2015   Past Surgical History:  Procedure Laterality Date  . CARPAL TUNNEL RELEASE  2004  . CARPAL TUNNEL RELEASE  10/31/2011   Procedure: CARPAL TUNNEL RELEASE;  Surgeon: RoCammie Sickle MD;  Location: [facility] Service: Orthopedics;  Laterality: Left;  . EYE SURGERY     cataracts  . HAND SURGERY  2007   right hand  . INGUINAL HERNIA REPAIR    . JOINT REPLACEMENT     left total knee  . KNEE SURGERY  1961   left knee  . TONSILLECTOMY    . TONSILLECTOMY AND ADENOIDECTOMY    . TRANSURETHRAL RESECTION OF PROSTATE      Allergies  Allergen Reactions  . Ciprofloxacin     REACTION: Diarrhea,l rectal irritation    Outpatient Encounter Medications as of 01/01/2018  Medication Sig  . allopurinol (ZYLOPRIM) 100 MG tablet Take 100 mg by mouth daily.   Marland Kitchen amLODipine (NORVASC) 5 MG tablet Take 5 mg by mouth daily.  Marland Kitchen aspirin 81 MG tablet Take 81 mg by mouth daily.  . Cholecalciferol (VITAMIN D-3 PO) Take 2,000 Units by mouth daily.   . diclofenac sodium  (VOLTAREN) 1 % GEL Apply 2 g topically daily as needed.  . donepezil (ARICEPT) 10 MG tablet Take 10 mg by mouth at bedtime.  . finasteride (PROSCAR) 5 MG tablet Take 5 mg by mouth at bedtime.   Marland Kitchen lisinopril (PRINIVIL,ZESTRIL) 40 MG tablet Take 40 mg by mouth daily.   . Melatonin 5 MG CAPS Take 1 capsule by mouth at bedtime.  . memantine (NAMENDA) 10 MG tablet Take 1 tablet by mouth 2 (two) times daily.  . metFORMIN (GLUCOPHAGE) 500 MG tablet Take 500 mg by mouth daily with breakfast.  . pantoprazole (PROTONIX) 40 MG tablet Take 40 mg by mouth daily.   . sertraline (ZOLOFT) 50 MG tablet Take 50 mg by mouth daily.  . [DISCONTINUED] IPRATROPIUM BROMIDE HFA IN Place 1 spray into both nostrils 3 (three) times daily.   No facility-administered encounter medications on file as of 01/01/2018.     Review of Systems  Constitutional: Positive for activity change. Negative for appetite change, chills and fever.  HENT: Negative for congestion and trouble swallowing.   Eyes: Negative for visual disturbance.  Respiratory: Negative for chest tightness and shortness of breath.   Cardiovascular: Negative for chest pain, palpitations and leg swelling.  Gastrointestinal: Positive for constipation. Negative for abdominal pain.  Genitourinary: Negative for dysuria.  Musculoskeletal: Positive for gait problem. Negative for arthralgias and joint swelling.  Neurological: Positive for numbness. Negative for dizziness and weakness.       Expressive aphasia  Psychiatric/Behavioral: Positive for confusion.    Immunization History  Administered Date(s) Administered  . Influenza Split 08/18/2011, 08/08/2012  . Influenza Whole 08/21/2006, 07/11/2007, 09/13/2009, 07/13/2010  . Influenza, Seasonal, Injecte, Preservative Fre 07/10/2013  . Influenza-Unspecified 08/08/2012, 07/17/2014, 08/10/2014, 06/16/2015, 07/30/2017  . Pneumococcal Conjugate-13 01/06/2014  . Pneumococcal Polysaccharide-23 03/12/2012, 03/10/2014  .  Td 10/09/1992  . Tdap 03/12/2012, 01/08/2014  . Zoster 12/26/2007, 03/10/2014   Pertinent  Health Maintenance Due  Topic Date Due  . FOOT EXAM  06/15/2016  . OPHTHALMOLOGY EXAM  12/12/2016  . COLONOSCOPY  02/18/2017  . HEMOGLOBIN A1C  05/21/2018  . INFLUENZA VACCINE  Completed  . PNA vac Low Risk Adult  Completed   No flowsheet data found. Functional Status Survey:    Vitals:   01/01/18 1024  BP: (!) 151/78  Pulse: (!) 58  Resp: 19  Temp: 97.7 F (36.5 C)  TempSrc: Oral  SpO2: 93%  Weight: 280 lb (127 kg)  Height: 6' 3"  (1.905 m)   Body mass index is 35 kg/m. Physical Exam  Constitutional: He appears well-developed and well-nourished. No distress.  Cardiovascular: Normal rate, regular rhythm, normal heart sounds and intact distal pulses.  Pulmonary/Chest: Effort normal  and breath sounds normal. No respiratory distress.  Abdominal: Soft. Bowel sounds are normal.  Musculoskeletal: Normal range of motion.  Neurological: He is alert.  Oriented to person, place, not time; severe expressive aphasia  Skin: Skin is warm and dry.  Psychiatric: He has a normal mood and affect.    Labs reviewed: Recent Labs    11/12/17 1718 11/21/17  NA 138 143  K 4.0 4.7  CL 105  --   CO2 25  --   GLUCOSE 182*  --   BUN 18 22*  CREATININE 1.47* 1.4*  CALCIUM 8.8*  --    Recent Labs    11/12/17 1718  AST 24  ALT 20  ALKPHOS 72  BILITOT 0.5  PROT 6.5  ALBUMIN 3.8   Recent Labs    11/12/17 1718  WBC 7.8  NEUTROABS 4.7  HGB 11.3*  HCT 34.8*  MCV 65.3*  PLT 172   Lab Results  Component Value Date   TSH 0.51 10/01/2012   Lab Results  Component Value Date   HGBA1C 7.8 11/21/2017   Lab Results  Component Value Date   CHOL 151 11/11/2013   HDL 33.00 (L) 11/11/2013   LDLCALC 96 11/11/2013   LDLDIRECT 97.8 05/26/2009   TRIG 112.0 11/11/2013   CHOLHDL 5 11/11/2013    Assessment/Plan 1. Mixed Alzheimer's and vascular dementia -cont current aricept and namenda  plus skilled care assistance with daily activities -would recommend ST involvement possibly if his wife is accepting to help some with his speech to preserve his current ability to interact as best possible  2. ACP (advance care planning) -documents reviewed--has HCPOA, living will and DNR on file -will need to complete MOST form in the future  3. Diabetes mellitus type 2 without retinopathy ([facility]) -glucose under adequate control given his dementia and skilled needs  4. Severe obstructive sleep apnea -cont cpap therapy  Family/ staff Communication:  Discussed with SNF nurse and pt's wife, [name]  Labs/tests ordered:  No new  Eartha Vonbehren L. Jerren Flinchbaugh, D.O. [facility] [address], [place] [address] Cell Phone (Mon-Fri 8am-5pm):  [phone] On Call:  [phone] & follow prompts after 5pm & weekends Office Phone:  [phone] Office Fax:  [phone]

## 2018-01-02 DIAGNOSIS — R482 Apraxia: Secondary | ICD-10-CM | POA: Diagnosis not present

## 2018-01-02 DIAGNOSIS — M6389 Disorders of muscle in diseases classified elsewhere, multiple sites: Secondary | ICD-10-CM | POA: Diagnosis not present

## 2018-01-02 DIAGNOSIS — R488 Other symbolic dysfunctions: Secondary | ICD-10-CM | POA: Diagnosis not present

## 2018-01-02 DIAGNOSIS — F0150 Vascular dementia without behavioral disturbance: Secondary | ICD-10-CM | POA: Diagnosis not present

## 2018-01-02 DIAGNOSIS — R4701 Aphasia: Secondary | ICD-10-CM | POA: Diagnosis not present

## 2018-01-02 DIAGNOSIS — R278 Other lack of coordination: Secondary | ICD-10-CM | POA: Diagnosis not present

## 2018-01-07 ENCOUNTER — Non-Acute Institutional Stay (SKILLED_NURSING_FACILITY): Payer: Medicare Other | Admitting: Adult Health

## 2018-01-07 DIAGNOSIS — I129 Hypertensive chronic kidney disease with stage 1 through stage 4 chronic kidney disease, or unspecified chronic kidney disease: Secondary | ICD-10-CM

## 2018-01-07 DIAGNOSIS — F329 Major depressive disorder, single episode, unspecified: Secondary | ICD-10-CM

## 2018-01-07 DIAGNOSIS — E119 Type 2 diabetes mellitus without complications: Secondary | ICD-10-CM | POA: Diagnosis not present

## 2018-01-07 DIAGNOSIS — N183 Chronic kidney disease, stage 3 unspecified: Secondary | ICD-10-CM

## 2018-01-07 DIAGNOSIS — Z0000 Encounter for general adult medical examination without abnormal findings: Secondary | ICD-10-CM | POA: Diagnosis not present

## 2018-01-07 LAB — HM DIABETES FOOT EXAM

## 2018-01-07 NOTE — Progress Notes (Signed)
Location:  [facility] of Service:  SNF (31) Provider:  Algis Greenhouse, AGNP-Student/ Royal Hawthorn, ANP-BC  Gayland Curry, DO  Patient Care Team: Alferd Apa as PCP - General (Geriatric Medicine)  Extended Emergency Contact Information Primary Emergency Contact: Roundtree,[name] Address: [address]          [place], [place] [address] [name] of [place] Phone: [phone] Mobile Phone: [phone] Relation: Spouse  Code Status: DNR Goals of care: Advanced Directive information Advanced Directives 01/01/2018  Does Patient Have a Medical Advance Directive? Yes  Type of Advance Directive Out of facility DNR (pink MOST or yellow form);Living will;Healthcare Power of Attorney  Does patient want to make changes to medical advance directive? No - Patient declined  Copy of [place] in Chart? Yes  Would patient like information on creating a medical advance directive? No - Patient declined  Pre-existing out of facility DNR order (yellow form or pink MOST form) Yellow form placed in chart (order not valid for inpatient use)     Chief Complaint  Patient presents with  . Annual Exam    HPI:  Pt is a 77 y.o. male seen today for an annual wellness exam. Patient is up to date on his vaccines. He was due for a diabetic foot exam and colonoscopy. Colonoscopy no longer recommended. He is due for an annual opthamology exam according to our records, but will FU with wife as he transitioned to SNF within the past three months. Pt did have a difficult time with this transition. He was noted by his wife to be sad. He endorsed sadness on my exam today. Zoloft 75 mg was added to his medication regiment at that time.   Since admission, his SBPs have trended higher to 150-60s. Amlodipine 5 mg was added back onto his regiment. SBPs continue to run high at this time. Patient denies any chest pain or SOB.   Per LPNs report, pt's  fasting CBGs have been running between 125- 175 in the  AM daily. No episodes of hypoglycemia. Last A1c was 7.8 (11/21/17). Pt is currently on a regular diet.     Past Medical History:  Diagnosis Date  . Abnormality of gait 04/05/2015  . Benign prostatic hypertrophy   . Dementia   . Diabetes mellitus type II    no meds  . Elevated PSA   . Erectile dysfunction   . Gout   . Hereditary and idiopathic peripheral neuropathy 08/02/2016  . Hypertension   . Memory changes   . Memory difficulties 04/05/2015  . Nephrolithiasis    hx of  . Obstructive sleep apnea   . Partial Achilles tendon tear   . Rosacea, acne   . Tremor 04/05/2015   Past Surgical History:  Procedure Laterality Date  . CARPAL TUNNEL RELEASE  2004  . CARPAL TUNNEL RELEASE  10/31/2011   Procedure: CARPAL TUNNEL RELEASE;  Surgeon: Cammie Sickle., MD;  Location: [facility];  Service: Orthopedics;  Laterality: Left;  . EYE SURGERY     cataracts  . HAND SURGERY  2007   right hand  . INGUINAL HERNIA REPAIR    . JOINT REPLACEMENT     left total knee  . KNEE SURGERY  1961   left knee  . TONSILLECTOMY    . TONSILLECTOMY AND ADENOIDECTOMY    . TRANSURETHRAL RESECTION OF PROSTATE      Allergies  Allergen Reactions  . Ciprofloxacin  REACTION: Diarrhea,l rectal irritation    Outpatient Encounter Medications as of 01/07/2018  Medication Sig  . allopurinol (ZYLOPRIM) 100 MG tablet Take 100 mg by mouth daily.   Marland Kitchen amLODipine (NORVASC) 5 MG tablet Take 5 mg by mouth daily.  Marland Kitchen aspirin 81 MG tablet Take 81 mg by mouth daily.  . Cholecalciferol (VITAMIN D-3 PO) Take 2,000 Units by mouth daily.   . diclofenac sodium (VOLTAREN) 1 % GEL Apply 2 g topically daily as needed.  . donepezil (ARICEPT) 10 MG tablet Take 10 mg by mouth at bedtime.  . finasteride (PROSCAR) 5 MG tablet Take 5 mg by mouth at bedtime.   Marland Kitchen lisinopril (PRINIVIL,ZESTRIL) 40 MG tablet Take 40 mg by mouth daily.   . Melatonin 5 MG CAPS Take  1 capsule by mouth at bedtime.  . memantine (NAMENDA) 10 MG tablet Take 1 tablet by mouth 2 (two) times daily.  . metFORMIN (GLUCOPHAGE) 500 MG tablet Take 500 mg by mouth daily with breakfast.  . pantoprazole (PROTONIX) 40 MG tablet Take 40 mg by mouth daily.   . sertraline (ZOLOFT) 50 MG tablet Take 75 mg by mouth daily.   No facility-administered encounter medications on file as of 01/07/2018.     Review of Systems  Constitutional: Negative for activity change, appetite change, fatigue and unexpected weight change.  HENT: Negative.   Eyes: Negative for discharge and visual disturbance.  Respiratory: Negative.   Cardiovascular: Negative.   Gastrointestinal: Negative for abdominal distention, constipation, diarrhea, nausea and vomiting.  Genitourinary: Negative.   Musculoskeletal: Negative.   Skin: Negative.   Neurological: Negative for dizziness, syncope and weakness.  Psychiatric/Behavioral: Positive for confusion. Negative for agitation. The patient is not nervous/anxious.     Immunization History  Administered Date(s) Administered  . Influenza Split 08/18/2011, 08/08/2012  . Influenza Whole 08/21/2006, 07/11/2007, 09/13/2009, 07/13/2010  . Influenza, Seasonal, Injecte, Preservative Fre 07/10/2013  . Influenza-Unspecified 08/08/2012, 07/17/2014, 08/10/2014, 06/16/2015, 07/30/2017  . Pneumococcal Conjugate-13 01/06/2014  . Pneumococcal Polysaccharide-23 03/12/2012, 03/10/2014  . Td 10/09/1992  . Tdap 03/12/2012, 01/08/2014  . Zoster 12/26/2007, 03/10/2014   Pertinent  Health Maintenance Due  Topic Date Due  . FOOT EXAM  06/15/2016  . OPHTHALMOLOGY EXAM  12/12/2016  . COLONOSCOPY  02/18/2017  . INFLUENZA VACCINE  05/09/2018  . HEMOGLOBIN A1C  05/21/2018  . PNA vac Low Risk Adult  Completed   No flowsheet data found. Functional Status Survey:    Social History   Socioeconomic History  . Marital status: Married    Spouse name: Not on file  . Number of children: 1  .  Years of education: MBA  . Highest education level: Not on file  Occupational History    Employer: RETIRED  Social Needs  . Financial resource strain: Not on file  . Food insecurity:    Worry: Not on file    Inability: Not on file  . Transportation needs:    Medical: Not on file    Non-medical: Not on file  Tobacco Use  . Smoking status: Never Smoker  . Smokeless tobacco: Never Used  Substance and Sexual Activity  . Alcohol use: No    Comment: rarely  . Drug use: No  . Sexual activity: Not on file  Lifestyle  . Physical activity:    Days per week: Not on file    Minutes per session: Not on file  . Stress: Not on file  Relationships  . Social connections:    Talks on phone: Not on  file    Gets together: Not on file    Attends religious service: Not on file    Active member of club or organization: Not on file    Attends meetings of clubs or organizations: Not on file    Relationship status: Not on file  . Intimate partner violence:    Fear of current or ex partner: Not on file    Emotionally abused: Not on file    Physically abused: Not on file    Forced sexual activity: Not on file  Other Topics Concern  . Not on file  Social History Narrative   Regular exercise.    Avoid Ibuprofen, naproxen, NSAIDS.   Designated Party Release signed on 03/03/2010.      Lives at [facility] w/ his wife   Patient drinks about 4-5 cups of caffeine daily.   Patient is right handed.   Family History  Problem Relation Age of Onset  . Heart disease Mother   . Dementia Sister   . COPD Sister   . Diabetes Sister   . Thalassemia Son   . Heart disease Paternal Grandfather   . Dementia Cousin   . Colon cancer Neg Hx   . Stomach cancer Neg Hx     Vitals:   01/07/18 1502  BP: 137/65  Pulse: 65  Resp: 12  Temp: (!) 97.2 F (36.2 C)  SpO2: 97%   There is no height or weight on file to calculate BMI. Physical Exam  Constitutional: Vital signs are normal. He appears  well-developed and well-nourished.  HENT:  Right Ear: Hearing, tympanic membrane, external ear and ear canal normal.  Left Ear: Hearing, tympanic membrane, external ear and ear canal normal.  Nose: Nose normal.  Mouth/Throat: Oropharynx is clear and moist and mucous membranes are normal.  Eyes: Pupils are equal, round, and reactive to light. Conjunctivae and EOM are normal. Right eye exhibits no discharge. Left eye exhibits no discharge.  Cardiovascular: Normal rate, regular rhythm, normal heart sounds and intact distal pulses.  Pulmonary/Chest: Effort normal and breath sounds normal.  Abdominal: Soft. Bowel sounds are normal. He exhibits no distension. There is no tenderness.  Musculoskeletal: Normal range of motion. He exhibits no edema or tenderness.  Neurological: He is alert. He has normal strength. He is disoriented.  Skin: Skin is warm, dry and intact.  Foot Exam: Distal pulses intact. No ulcerations on feet. Pt has full sensation of both feet.   Psychiatric: His speech is normal and behavior is normal. Judgment normal. Cognition and memory are impaired. He exhibits a depressed mood. He exhibits abnormal recent memory.  Nursing note and vitals reviewed.   Labs reviewed: Recent Labs    11/12/17 1718 11/21/17  NA 138 143  K 4.0 4.7  CL 105  --   CO2 25  --   GLUCOSE 182*  --   BUN 18 22*  CREATININE 1.47* 1.4*  CALCIUM 8.8*  --    Recent Labs    11/12/17 1718  AST 24  ALT 20  ALKPHOS 72  BILITOT 0.5  PROT 6.5  ALBUMIN 3.8   Recent Labs    11/12/17 1718  WBC 7.8  NEUTROABS 4.7  HGB 11.3*  HCT 34.8*  MCV 65.3*  PLT 172   Lab Results  Component Value Date   TSH 0.51 10/01/2012   Lab Results  Component Value Date   HGBA1C 7.8 11/21/2017   Lab Results  Component Value Date   CHOL 151 11/11/2013  HDL 33.00 (L) 11/11/2013   LDLCALC 96 11/11/2013   LDLDIRECT 97.8 05/26/2009   TRIG 112.0 11/11/2013   CHOLHDL 5 11/11/2013   Wt Readings from Last 3  Encounters:  01/07/18 273 lb 6.4 oz (124 kg)  01/01/18 280 lb (127 kg)  12/10/17 280 lb (127 kg)   Significant Diagnostic Results in last 30 days:  No results found.  Assessment/Plan 1. Annual physical exam Continue management of chronic conditions Continue with SNF level care  2. Benign hypertension with chronic kidney disease, stage III ([facility]) Increase Amlodipine to 10 mg daily.  Monitor dependent edema   3. Diabetes mellitus type 2 without retinopathy ([facility]) Decrease the amount of carbohydrates in diet  FU with wife about when Ophthamology appointment is due  A1c due 02/2018  Weights discussed with LPN and they will reweigh him in the morning (?accuracy). PO intake is good. Continue to monitor.   4. Depression Monitor depression and response to increased dosing of zoloft  Family/ staff Communication: LPN notified   Labs/tests ordered:  N/a

## 2018-01-08 DIAGNOSIS — G912 (Idiopathic) normal pressure hydrocephalus: Secondary | ICD-10-CM | POA: Diagnosis not present

## 2018-01-08 DIAGNOSIS — R278 Other lack of coordination: Secondary | ICD-10-CM | POA: Diagnosis not present

## 2018-01-08 DIAGNOSIS — G609 Hereditary and idiopathic neuropathy, unspecified: Secondary | ICD-10-CM | POA: Diagnosis not present

## 2018-01-08 DIAGNOSIS — M6389 Disorders of muscle in diseases classified elsewhere, multiple sites: Secondary | ICD-10-CM | POA: Diagnosis not present

## 2018-01-08 DIAGNOSIS — M48061 Spinal stenosis, lumbar region without neurogenic claudication: Secondary | ICD-10-CM | POA: Diagnosis not present

## 2018-01-08 DIAGNOSIS — R296 Repeated falls: Secondary | ICD-10-CM | POA: Diagnosis not present

## 2018-01-08 DIAGNOSIS — R413 Other amnesia: Secondary | ICD-10-CM | POA: Diagnosis not present

## 2018-01-08 DIAGNOSIS — R2681 Unsteadiness on feet: Secondary | ICD-10-CM | POA: Diagnosis not present

## 2018-01-08 DIAGNOSIS — F0390 Unspecified dementia without behavioral disturbance: Secondary | ICD-10-CM | POA: Diagnosis not present

## 2018-01-09 ENCOUNTER — Encounter: Payer: Self-pay | Admitting: Adult Health

## 2018-01-09 DIAGNOSIS — M6389 Disorders of muscle in diseases classified elsewhere, multiple sites: Secondary | ICD-10-CM | POA: Diagnosis not present

## 2018-01-09 DIAGNOSIS — R278 Other lack of coordination: Secondary | ICD-10-CM | POA: Diagnosis not present

## 2018-01-09 DIAGNOSIS — F0390 Unspecified dementia without behavioral disturbance: Secondary | ICD-10-CM | POA: Diagnosis not present

## 2018-01-09 DIAGNOSIS — G609 Hereditary and idiopathic neuropathy, unspecified: Secondary | ICD-10-CM | POA: Diagnosis not present

## 2018-01-09 DIAGNOSIS — M48061 Spinal stenosis, lumbar region without neurogenic claudication: Secondary | ICD-10-CM | POA: Diagnosis not present

## 2018-01-09 DIAGNOSIS — G912 (Idiopathic) normal pressure hydrocephalus: Secondary | ICD-10-CM | POA: Diagnosis not present

## 2018-01-10 DIAGNOSIS — R278 Other lack of coordination: Secondary | ICD-10-CM | POA: Diagnosis not present

## 2018-01-10 DIAGNOSIS — M48061 Spinal stenosis, lumbar region without neurogenic claudication: Secondary | ICD-10-CM | POA: Diagnosis not present

## 2018-01-10 DIAGNOSIS — F0390 Unspecified dementia without behavioral disturbance: Secondary | ICD-10-CM | POA: Diagnosis not present

## 2018-01-10 DIAGNOSIS — G912 (Idiopathic) normal pressure hydrocephalus: Secondary | ICD-10-CM | POA: Diagnosis not present

## 2018-01-10 DIAGNOSIS — M6389 Disorders of muscle in diseases classified elsewhere, multiple sites: Secondary | ICD-10-CM | POA: Diagnosis not present

## 2018-01-10 DIAGNOSIS — G609 Hereditary and idiopathic neuropathy, unspecified: Secondary | ICD-10-CM | POA: Diagnosis not present

## 2018-01-13 DIAGNOSIS — G912 (Idiopathic) normal pressure hydrocephalus: Secondary | ICD-10-CM | POA: Diagnosis not present

## 2018-01-13 DIAGNOSIS — G609 Hereditary and idiopathic neuropathy, unspecified: Secondary | ICD-10-CM | POA: Diagnosis not present

## 2018-01-13 DIAGNOSIS — M48061 Spinal stenosis, lumbar region without neurogenic claudication: Secondary | ICD-10-CM | POA: Diagnosis not present

## 2018-01-13 DIAGNOSIS — F0390 Unspecified dementia without behavioral disturbance: Secondary | ICD-10-CM | POA: Diagnosis not present

## 2018-01-13 DIAGNOSIS — R278 Other lack of coordination: Secondary | ICD-10-CM | POA: Diagnosis not present

## 2018-01-13 DIAGNOSIS — M6389 Disorders of muscle in diseases classified elsewhere, multiple sites: Secondary | ICD-10-CM | POA: Diagnosis not present

## 2018-01-16 DIAGNOSIS — M6389 Disorders of muscle in diseases classified elsewhere, multiple sites: Secondary | ICD-10-CM | POA: Diagnosis not present

## 2018-01-16 DIAGNOSIS — F0390 Unspecified dementia without behavioral disturbance: Secondary | ICD-10-CM | POA: Diagnosis not present

## 2018-01-16 DIAGNOSIS — G912 (Idiopathic) normal pressure hydrocephalus: Secondary | ICD-10-CM | POA: Diagnosis not present

## 2018-01-16 DIAGNOSIS — G609 Hereditary and idiopathic neuropathy, unspecified: Secondary | ICD-10-CM | POA: Diagnosis not present

## 2018-01-16 DIAGNOSIS — R278 Other lack of coordination: Secondary | ICD-10-CM | POA: Diagnosis not present

## 2018-01-16 DIAGNOSIS — M48061 Spinal stenosis, lumbar region without neurogenic claudication: Secondary | ICD-10-CM | POA: Diagnosis not present

## 2018-01-18 DIAGNOSIS — M48061 Spinal stenosis, lumbar region without neurogenic claudication: Secondary | ICD-10-CM | POA: Diagnosis not present

## 2018-01-18 DIAGNOSIS — G912 (Idiopathic) normal pressure hydrocephalus: Secondary | ICD-10-CM | POA: Diagnosis not present

## 2018-01-18 DIAGNOSIS — F0390 Unspecified dementia without behavioral disturbance: Secondary | ICD-10-CM | POA: Diagnosis not present

## 2018-01-18 DIAGNOSIS — R278 Other lack of coordination: Secondary | ICD-10-CM | POA: Diagnosis not present

## 2018-01-18 DIAGNOSIS — M6389 Disorders of muscle in diseases classified elsewhere, multiple sites: Secondary | ICD-10-CM | POA: Diagnosis not present

## 2018-01-18 DIAGNOSIS — G609 Hereditary and idiopathic neuropathy, unspecified: Secondary | ICD-10-CM | POA: Diagnosis not present

## 2018-01-25 DIAGNOSIS — G609 Hereditary and idiopathic neuropathy, unspecified: Secondary | ICD-10-CM | POA: Diagnosis not present

## 2018-01-25 DIAGNOSIS — M48061 Spinal stenosis, lumbar region without neurogenic claudication: Secondary | ICD-10-CM | POA: Diagnosis not present

## 2018-01-25 DIAGNOSIS — M6389 Disorders of muscle in diseases classified elsewhere, multiple sites: Secondary | ICD-10-CM | POA: Diagnosis not present

## 2018-01-25 DIAGNOSIS — G912 (Idiopathic) normal pressure hydrocephalus: Secondary | ICD-10-CM | POA: Diagnosis not present

## 2018-01-25 DIAGNOSIS — R278 Other lack of coordination: Secondary | ICD-10-CM | POA: Diagnosis not present

## 2018-01-25 DIAGNOSIS — F0390 Unspecified dementia without behavioral disturbance: Secondary | ICD-10-CM | POA: Diagnosis not present

## 2018-02-07 ENCOUNTER — Non-Acute Institutional Stay (SKILLED_NURSING_FACILITY): Payer: Medicare Other | Admitting: Adult Health

## 2018-02-07 ENCOUNTER — Encounter: Payer: Self-pay | Admitting: Adult Health

## 2018-02-07 DIAGNOSIS — E119 Type 2 diabetes mellitus without complications: Secondary | ICD-10-CM | POA: Diagnosis not present

## 2018-02-07 DIAGNOSIS — F0280 Dementia in other diseases classified elsewhere without behavioral disturbance: Secondary | ICD-10-CM

## 2018-02-07 DIAGNOSIS — M4712 Other spondylosis with myelopathy, cervical region: Secondary | ICD-10-CM

## 2018-02-07 DIAGNOSIS — G309 Alzheimer's disease, unspecified: Secondary | ICD-10-CM

## 2018-02-07 DIAGNOSIS — N183 Chronic kidney disease, stage 3 unspecified: Secondary | ICD-10-CM

## 2018-02-07 DIAGNOSIS — I129 Hypertensive chronic kidney disease with stage 1 through stage 4 chronic kidney disease, or unspecified chronic kidney disease: Secondary | ICD-10-CM | POA: Diagnosis not present

## 2018-02-07 DIAGNOSIS — F329 Major depressive disorder, single episode, unspecified: Secondary | ICD-10-CM

## 2018-02-07 DIAGNOSIS — F0150 Vascular dementia without behavioral disturbance: Secondary | ICD-10-CM | POA: Diagnosis not present

## 2018-02-07 NOTE — Progress Notes (Signed)
Location:  Occupational psychologist of Service:  SNF (31) Provider:   Cindi Carbon, ANP [place] [phone]  Gayland Curry, DO  Patient Care Team: Gayland Curry, DO as PCP - General (Geriatric Medicine)  Extended Emergency Contact Information Primary Emergency Contact: Sloan,[name] Address: [address]          [place], [place] [address] [name] of [place] Phone: [phone] Mobile Phone: [phone] Relation: Spouse  Code Status:  DNR Goals of care: Advanced Directive information Advanced Directives 01/01/2018  Does Patient Have a Medical Advance Directive? Yes  Type of Advance Directive Out of facility DNR (pink MOST or yellow form);Living will;Healthcare Power of Attorney  Does patient want to make changes to medical advance directive? No - Patient declined  Copy of [place] in Chart? Yes  Would patient like information on creating a medical advance directive? No - Patient declined  Pre-existing out of facility DNR order (yellow form or pink MOST form) Yellow form placed in chart (order not valid for inpatient use)     Chief Complaint  Patient presents with  . Medical Management of Chronic Issues    HPI:  Pt is a 77 y.o. male seen today for medical management of chronic diseases.  He resides in skilled care due to cognitive and functional decline associated with dementia and brain injury.  Depression: he reports he feels sad today because his wife has left him. He was not redirected easily and asked why I was so confident that she had not left him. His weight remains stable and staff deny any decreased appetite, tearful episodes, etc  Dementia: issues with work finding, needs assistance with all ADLs On aricept and namenda  HTN: BP range [phone] systolic, BP recorded at 785/88 on 5/1 which was probably done with an electric cuff  DM: Lab Results  Component Value Date   HGBA1C 7.8 11/21/2017    Mr. Amesquita fell on 4/24 but did not sustain any injuries. He apparently slid from the recliner to the floor.   He occasionally reports pain and uses tylenol. Voltarenl gel was ordered to his left hand but was changed to prn due to the fact that it was not covered by ins. He denies pain for my visit. Due to his word finding issues he has difficulty telling the staff the location of his pain. He is a former Automotive engineer and has a hx of cervical spondylosis and left knee OA   Past Medical History:  Diagnosis Date  . Abnormality of gait 04/05/2015  . Benign prostatic hypertrophy   . Dementia   . Diabetes mellitus type II    no meds  . Elevated PSA   . Erectile dysfunction   . Gout   . Hereditary and idiopathic peripheral neuropathy 08/02/2016  . Hypertension   . Memory changes   . Memory difficulties 04/05/2015  . Nephrolithiasis    hx of  . Obstructive sleep apnea   . Partial Achilles tendon tear   . Rosacea, acne   . Tremor 04/05/2015   Past Surgical History:  Procedure Laterality Date  . CARPAL TUNNEL RELEASE  2004  . CARPAL TUNNEL RELEASE  10/31/2011   Procedure: CARPAL TUNNEL RELEASE;  Surgeon: Cammie Sickle., MD;  Location: [facility];  Service: Orthopedics;  Laterality: Left;  . EYE SURGERY     cataracts  . HAND SURGERY  2007   right hand  .  INGUINAL HERNIA REPAIR    . JOINT REPLACEMENT     left total knee  . KNEE SURGERY  1961   left knee  . TONSILLECTOMY    . TONSILLECTOMY AND ADENOIDECTOMY    . TRANSURETHRAL RESECTION OF PROSTATE      Allergies  Allergen Reactions  . Ciprofloxacin     REACTION: Diarrhea,l rectal irritation    Outpatient Encounter Medications as of 02/07/2018  Medication Sig  . allopurinol (ZYLOPRIM) 100 MG tablet Take 100 mg by mouth daily.   Marland Kitchen amLODipine (NORVASC) 10 MG tablet Take 10 mg by mouth daily.  Marland Kitchen aspirin 81 MG tablet Take 81 mg by mouth daily.  . Cholecalciferol (VITAMIN D-3 PO) Take 2,000 Units  by mouth daily.   . diclofenac sodium (VOLTAREN) 1 % GEL Apply 2 g topically daily as needed.  . donepezil (ARICEPT) 10 MG tablet Take 10 mg by mouth at bedtime.  . finasteride (PROSCAR) 5 MG tablet Take 5 mg by mouth at bedtime.   Marland Kitchen lisinopril (PRINIVIL,ZESTRIL) 40 MG tablet Take 40 mg by mouth daily.   . memantine (NAMENDA) 10 MG tablet Take 1 tablet by mouth 2 (two) times daily.  . metFORMIN (GLUCOPHAGE) 500 MG tablet Take 500 mg by mouth daily with breakfast.  . pantoprazole (PROTONIX) 40 MG tablet Take 40 mg by mouth daily.   . sertraline (ZOLOFT) 50 MG tablet Take 75 mg by mouth daily.  . [DISCONTINUED] amLODipine (NORVASC) 5 MG tablet Take 5 mg by mouth daily.   . [DISCONTINUED] Melatonin 5 MG CAPS Take 1 capsule by mouth at bedtime.   No facility-administered encounter medications on file as of 02/07/2018.     Review of Systems  Unable to perform ROS: Dementia  Psychiatric/Behavioral: Positive for confusion and dysphoric mood. Negative for agitation, behavioral problems, hallucinations, self-injury, sleep disturbance and suicidal ideas. The patient is not nervous/anxious.        Delusion that his wife has left him    Immunization History  Administered Date(s) Administered  . Influenza Split 08/18/2011, 08/08/2012  . Influenza Whole 08/21/2006, 07/11/2007, 09/13/2009, 07/13/2010  . Influenza, Seasonal, Injecte, Preservative Fre 07/10/2013  . Influenza-Unspecified 08/08/2012, 07/17/2014, 08/10/2014, 06/16/2015, 07/30/2017  . Pneumococcal Conjugate-13 01/06/2014  . Pneumococcal Polysaccharide-23 03/12/2012, 03/10/2014  . Td 10/09/1992  . Tdap 03/12/2012, 01/08/2014  . Zoster 12/26/2007, 03/10/2014   Pertinent  Health Maintenance Due  Topic Date Due  . OPHTHALMOLOGY EXAM  12/12/2016  . INFLUENZA VACCINE  05/09/2018  . HEMOGLOBIN A1C  05/21/2018  . FOOT EXAM  01/08/2019  . PNA vac Low Risk Adult  Completed  . COLONOSCOPY  Discontinued   No flowsheet data found. Functional  Status Survey:    Vitals:   02/07/18 1125  Pulse: 64  Resp: 20  Temp: 98.6 F (37 C)  SpO2: 97%  Weight: 270 lb 4.8 oz (122.6 kg)   Body mass index is 33.79 kg/m. Physical Exam  Constitutional: No distress.  HENT:  Head: Normocephalic and atraumatic.  Neck: Normal range of motion. Neck supple. No JVD present. No tracheal deviation present. No thyromegaly present.  Cardiovascular: Normal rate and regular rhythm.  No murmur heard. Bilateral trace ankle edema  Pulmonary/Chest: Effort normal and breath sounds normal. No respiratory distress. He has no wheezes.  Abdominal: Soft. Bowel sounds are normal. He exhibits no distension. There is no tenderness.  Musculoskeletal: He exhibits no edema or tenderness.  Rigidity to both knees  Lymphadenopathy:    He has no cervical adenopathy.  Neurological:  He is alert. No cranial nerve deficit.  Oriented to self only. Able to f/c. Word finding issues. BUE strength 4/5. BLE strength 3/5    Skin: Skin is warm and dry. He is not diaphoretic.  Psychiatric: He has a normal mood and affect.  Nursing note and vitals reviewed.   Labs reviewed: Recent Labs    11/12/17 1718 11/21/17  NA 138 143  K 4.0 4.7  CL 105  --   CO2 25  --   GLUCOSE 182*  --   BUN 18 22*  CREATININE 1.47* 1.4*  CALCIUM 8.8*  --    Recent Labs    11/12/17 1718  AST 24  ALT 20  ALKPHOS 72  BILITOT 0.5  PROT 6.5  ALBUMIN 3.8   Recent Labs    11/12/17 1718  WBC 7.8  NEUTROABS 4.7  HGB 11.3*  HCT 34.8*  MCV 65.3*  PLT 172   Lab Results  Component Value Date   TSH 0.51 10/01/2012   Lab Results  Component Value Date   HGBA1C 7.8 11/21/2017   Lab Results  Component Value Date   CHOL 151 11/11/2013   HDL 33.00 (L) 11/11/2013   LDLCALC 96 11/11/2013   LDLDIRECT 97.8 05/26/2009   TRIG 112.0 11/11/2013   CHOLHDL 5 11/11/2013    Significant Diagnostic Results in last 30 days:  No results found.  Assessment/Plan  Mixed Alzheimer's and  vascular dementia Continue Namenda and Aricept   Diabetes mellitus type 2 without retinopathy ([place]) Continue to monitor A1C q 6 months. Goal A1C <8%. Continue metformin  Depression Increase zoloft to 100 mg qhs  Benign hypertension with chronic kidney disease, stage III (HCC) Goal < 150/90, most numbers meet this goals. Would recommend manual BPs for accuracy  Chronic kidney disease, stage III (moderate) (HCC) Continue to periodically monitor BMP and avoid nephrotoxic agents   Cervical spondylosis with myelopathy Denies any pain, numbness or tingling. Uses tylenol for pain    Family/ staff Communication: discussed with resident/nurse Labs/tests ordered:  NA

## 2018-02-07 NOTE — Assessment & Plan Note (Signed)
Goal < 150/90, most numbers meet this goals. Would recommend manual BPs for accuracy

## 2018-02-07 NOTE — Assessment & Plan Note (Signed)
Continue to periodically monitor BMP and avoid nephrotoxic agents

## 2018-02-07 NOTE — Assessment & Plan Note (Signed)
Denies any pain, numbness or tingling. Uses tylenol for pain

## 2018-02-07 NOTE — Assessment & Plan Note (Signed)
Continue to monitor A1C q 6 months. Goal A1C <8%. Continue metformin

## 2018-02-07 NOTE — Assessment & Plan Note (Signed)
Increase zoloft to 100 mg qhs

## 2018-02-07 NOTE — Assessment & Plan Note (Signed)
- 

## 2018-02-15 DIAGNOSIS — R4189 Other symptoms and signs involving cognitive functions and awareness: Secondary | ICD-10-CM | POA: Diagnosis not present

## 2018-02-15 DIAGNOSIS — R278 Other lack of coordination: Secondary | ICD-10-CM | POA: Diagnosis not present

## 2018-02-15 DIAGNOSIS — R633 Feeding difficulties: Secondary | ICD-10-CM | POA: Diagnosis not present

## 2018-02-15 DIAGNOSIS — F0280 Dementia in other diseases classified elsewhere without behavioral disturbance: Secondary | ICD-10-CM | POA: Diagnosis not present

## 2018-02-15 DIAGNOSIS — R258 Other abnormal involuntary movements: Secondary | ICD-10-CM | POA: Diagnosis not present

## 2018-02-15 DIAGNOSIS — F0150 Vascular dementia without behavioral disturbance: Secondary | ICD-10-CM | POA: Diagnosis not present

## 2018-02-18 DIAGNOSIS — R278 Other lack of coordination: Secondary | ICD-10-CM | POA: Diagnosis not present

## 2018-02-18 DIAGNOSIS — F0280 Dementia in other diseases classified elsewhere without behavioral disturbance: Secondary | ICD-10-CM | POA: Diagnosis not present

## 2018-02-18 DIAGNOSIS — R633 Feeding difficulties: Secondary | ICD-10-CM | POA: Diagnosis not present

## 2018-02-18 DIAGNOSIS — F0150 Vascular dementia without behavioral disturbance: Secondary | ICD-10-CM | POA: Diagnosis not present

## 2018-02-18 DIAGNOSIS — R258 Other abnormal involuntary movements: Secondary | ICD-10-CM | POA: Diagnosis not present

## 2018-02-18 DIAGNOSIS — R4189 Other symptoms and signs involving cognitive functions and awareness: Secondary | ICD-10-CM | POA: Diagnosis not present

## 2018-02-20 DIAGNOSIS — R4189 Other symptoms and signs involving cognitive functions and awareness: Secondary | ICD-10-CM | POA: Diagnosis not present

## 2018-02-20 DIAGNOSIS — R278 Other lack of coordination: Secondary | ICD-10-CM | POA: Diagnosis not present

## 2018-02-20 DIAGNOSIS — F0150 Vascular dementia without behavioral disturbance: Secondary | ICD-10-CM | POA: Diagnosis not present

## 2018-02-20 DIAGNOSIS — R633 Feeding difficulties: Secondary | ICD-10-CM | POA: Diagnosis not present

## 2018-02-20 DIAGNOSIS — F0280 Dementia in other diseases classified elsewhere without behavioral disturbance: Secondary | ICD-10-CM | POA: Diagnosis not present

## 2018-02-20 DIAGNOSIS — R258 Other abnormal involuntary movements: Secondary | ICD-10-CM | POA: Diagnosis not present

## 2018-02-22 DIAGNOSIS — R278 Other lack of coordination: Secondary | ICD-10-CM | POA: Diagnosis not present

## 2018-02-22 DIAGNOSIS — F0150 Vascular dementia without behavioral disturbance: Secondary | ICD-10-CM | POA: Diagnosis not present

## 2018-02-22 DIAGNOSIS — R4189 Other symptoms and signs involving cognitive functions and awareness: Secondary | ICD-10-CM | POA: Diagnosis not present

## 2018-02-22 DIAGNOSIS — R258 Other abnormal involuntary movements: Secondary | ICD-10-CM | POA: Diagnosis not present

## 2018-02-22 DIAGNOSIS — F0280 Dementia in other diseases classified elsewhere without behavioral disturbance: Secondary | ICD-10-CM | POA: Diagnosis not present

## 2018-02-22 DIAGNOSIS — R633 Feeding difficulties: Secondary | ICD-10-CM | POA: Diagnosis not present

## 2018-02-26 DIAGNOSIS — R258 Other abnormal involuntary movements: Secondary | ICD-10-CM | POA: Diagnosis not present

## 2018-02-26 DIAGNOSIS — R633 Feeding difficulties: Secondary | ICD-10-CM | POA: Diagnosis not present

## 2018-02-26 DIAGNOSIS — L249 Irritant contact dermatitis, unspecified cause: Secondary | ICD-10-CM | POA: Diagnosis not present

## 2018-02-26 DIAGNOSIS — R4189 Other symptoms and signs involving cognitive functions and awareness: Secondary | ICD-10-CM | POA: Diagnosis not present

## 2018-02-26 DIAGNOSIS — F0150 Vascular dementia without behavioral disturbance: Secondary | ICD-10-CM | POA: Diagnosis not present

## 2018-02-26 DIAGNOSIS — B079 Viral wart, unspecified: Secondary | ICD-10-CM | POA: Diagnosis not present

## 2018-02-26 DIAGNOSIS — R52 Pain, unspecified: Secondary | ICD-10-CM | POA: Diagnosis not present

## 2018-02-26 DIAGNOSIS — F0280 Dementia in other diseases classified elsewhere without behavioral disturbance: Secondary | ICD-10-CM | POA: Diagnosis not present

## 2018-02-26 DIAGNOSIS — R278 Other lack of coordination: Secondary | ICD-10-CM | POA: Diagnosis not present

## 2018-02-26 DIAGNOSIS — L821 Other seborrheic keratosis: Secondary | ICD-10-CM | POA: Diagnosis not present

## 2018-02-27 DIAGNOSIS — R278 Other lack of coordination: Secondary | ICD-10-CM | POA: Diagnosis not present

## 2018-02-27 DIAGNOSIS — R4189 Other symptoms and signs involving cognitive functions and awareness: Secondary | ICD-10-CM | POA: Diagnosis not present

## 2018-02-27 DIAGNOSIS — R258 Other abnormal involuntary movements: Secondary | ICD-10-CM | POA: Diagnosis not present

## 2018-02-27 DIAGNOSIS — F0150 Vascular dementia without behavioral disturbance: Secondary | ICD-10-CM | POA: Diagnosis not present

## 2018-02-27 DIAGNOSIS — F0280 Dementia in other diseases classified elsewhere without behavioral disturbance: Secondary | ICD-10-CM | POA: Diagnosis not present

## 2018-02-27 DIAGNOSIS — R633 Feeding difficulties: Secondary | ICD-10-CM | POA: Diagnosis not present

## 2018-03-07 DIAGNOSIS — F0280 Dementia in other diseases classified elsewhere without behavioral disturbance: Secondary | ICD-10-CM | POA: Diagnosis not present

## 2018-03-07 DIAGNOSIS — R278 Other lack of coordination: Secondary | ICD-10-CM | POA: Diagnosis not present

## 2018-03-07 DIAGNOSIS — R258 Other abnormal involuntary movements: Secondary | ICD-10-CM | POA: Diagnosis not present

## 2018-03-07 DIAGNOSIS — R4189 Other symptoms and signs involving cognitive functions and awareness: Secondary | ICD-10-CM | POA: Diagnosis not present

## 2018-03-07 DIAGNOSIS — R633 Feeding difficulties: Secondary | ICD-10-CM | POA: Diagnosis not present

## 2018-03-07 DIAGNOSIS — F0150 Vascular dementia without behavioral disturbance: Secondary | ICD-10-CM | POA: Diagnosis not present

## 2018-03-08 DIAGNOSIS — R4189 Other symptoms and signs involving cognitive functions and awareness: Secondary | ICD-10-CM | POA: Diagnosis not present

## 2018-03-08 DIAGNOSIS — F0150 Vascular dementia without behavioral disturbance: Secondary | ICD-10-CM | POA: Diagnosis not present

## 2018-03-08 DIAGNOSIS — R278 Other lack of coordination: Secondary | ICD-10-CM | POA: Diagnosis not present

## 2018-03-08 DIAGNOSIS — R633 Feeding difficulties: Secondary | ICD-10-CM | POA: Diagnosis not present

## 2018-03-08 DIAGNOSIS — F0280 Dementia in other diseases classified elsewhere without behavioral disturbance: Secondary | ICD-10-CM | POA: Diagnosis not present

## 2018-03-08 DIAGNOSIS — R258 Other abnormal involuntary movements: Secondary | ICD-10-CM | POA: Diagnosis not present

## 2018-03-13 DIAGNOSIS — F0280 Dementia in other diseases classified elsewhere without behavioral disturbance: Secondary | ICD-10-CM | POA: Diagnosis not present

## 2018-03-13 DIAGNOSIS — F0150 Vascular dementia without behavioral disturbance: Secondary | ICD-10-CM | POA: Diagnosis not present

## 2018-03-13 DIAGNOSIS — R4189 Other symptoms and signs involving cognitive functions and awareness: Secondary | ICD-10-CM | POA: Diagnosis not present

## 2018-03-13 DIAGNOSIS — R633 Feeding difficulties: Secondary | ICD-10-CM | POA: Diagnosis not present

## 2018-03-13 DIAGNOSIS — R278 Other lack of coordination: Secondary | ICD-10-CM | POA: Diagnosis not present

## 2018-03-13 DIAGNOSIS — R258 Other abnormal involuntary movements: Secondary | ICD-10-CM | POA: Diagnosis not present

## 2018-03-20 DIAGNOSIS — F0150 Vascular dementia without behavioral disturbance: Secondary | ICD-10-CM | POA: Diagnosis not present

## 2018-03-20 DIAGNOSIS — R4189 Other symptoms and signs involving cognitive functions and awareness: Secondary | ICD-10-CM | POA: Diagnosis not present

## 2018-03-20 DIAGNOSIS — R258 Other abnormal involuntary movements: Secondary | ICD-10-CM | POA: Diagnosis not present

## 2018-03-20 DIAGNOSIS — R278 Other lack of coordination: Secondary | ICD-10-CM | POA: Diagnosis not present

## 2018-03-20 DIAGNOSIS — R633 Feeding difficulties: Secondary | ICD-10-CM | POA: Diagnosis not present

## 2018-03-20 DIAGNOSIS — F0280 Dementia in other diseases classified elsewhere without behavioral disturbance: Secondary | ICD-10-CM | POA: Diagnosis not present

## 2018-03-22 DIAGNOSIS — R258 Other abnormal involuntary movements: Secondary | ICD-10-CM | POA: Diagnosis not present

## 2018-03-22 DIAGNOSIS — F0280 Dementia in other diseases classified elsewhere without behavioral disturbance: Secondary | ICD-10-CM | POA: Diagnosis not present

## 2018-03-22 DIAGNOSIS — R633 Feeding difficulties: Secondary | ICD-10-CM | POA: Diagnosis not present

## 2018-03-22 DIAGNOSIS — R278 Other lack of coordination: Secondary | ICD-10-CM | POA: Diagnosis not present

## 2018-03-22 DIAGNOSIS — F0150 Vascular dementia without behavioral disturbance: Secondary | ICD-10-CM | POA: Diagnosis not present

## 2018-03-22 DIAGNOSIS — R4189 Other symptoms and signs involving cognitive functions and awareness: Secondary | ICD-10-CM | POA: Diagnosis not present

## 2018-03-25 DIAGNOSIS — R4189 Other symptoms and signs involving cognitive functions and awareness: Secondary | ICD-10-CM | POA: Diagnosis not present

## 2018-03-25 DIAGNOSIS — R258 Other abnormal involuntary movements: Secondary | ICD-10-CM | POA: Diagnosis not present

## 2018-03-25 DIAGNOSIS — R633 Feeding difficulties: Secondary | ICD-10-CM | POA: Diagnosis not present

## 2018-03-25 DIAGNOSIS — F0280 Dementia in other diseases classified elsewhere without behavioral disturbance: Secondary | ICD-10-CM | POA: Diagnosis not present

## 2018-03-25 DIAGNOSIS — R278 Other lack of coordination: Secondary | ICD-10-CM | POA: Diagnosis not present

## 2018-03-25 DIAGNOSIS — F0150 Vascular dementia without behavioral disturbance: Secondary | ICD-10-CM | POA: Diagnosis not present

## 2018-03-26 ENCOUNTER — Encounter: Payer: Self-pay | Admitting: Internal Medicine

## 2018-03-26 ENCOUNTER — Non-Acute Institutional Stay (SKILLED_NURSING_FACILITY): Payer: Medicare Other | Admitting: Internal Medicine

## 2018-03-26 DIAGNOSIS — L249 Irritant contact dermatitis, unspecified cause: Secondary | ICD-10-CM | POA: Diagnosis not present

## 2018-03-26 DIAGNOSIS — F0280 Dementia in other diseases classified elsewhere without behavioral disturbance: Secondary | ICD-10-CM

## 2018-03-26 DIAGNOSIS — I129 Hypertensive chronic kidney disease with stage 1 through stage 4 chronic kidney disease, or unspecified chronic kidney disease: Secondary | ICD-10-CM

## 2018-03-26 DIAGNOSIS — E119 Type 2 diabetes mellitus without complications: Secondary | ICD-10-CM | POA: Diagnosis not present

## 2018-03-26 DIAGNOSIS — G309 Alzheimer's disease, unspecified: Secondary | ICD-10-CM | POA: Diagnosis not present

## 2018-03-26 DIAGNOSIS — M4712 Other spondylosis with myelopathy, cervical region: Secondary | ICD-10-CM

## 2018-03-26 DIAGNOSIS — F0150 Vascular dementia without behavioral disturbance: Secondary | ICD-10-CM

## 2018-03-26 DIAGNOSIS — R482 Apraxia: Secondary | ICD-10-CM

## 2018-03-26 DIAGNOSIS — N183 Chronic kidney disease, stage 3 unspecified: Secondary | ICD-10-CM

## 2018-03-26 DIAGNOSIS — F329 Major depressive disorder, single episode, unspecified: Secondary | ICD-10-CM | POA: Diagnosis not present

## 2018-03-26 NOTE — Progress Notes (Signed)
Patient ID: [name], male   DOB: 10/07/1941, 77 y.o.   MRN: [id number]  Location:  [facility] Room Number: 120 Place of Service:  SNF [phone]) Provider:   Gayland Curry, DO  Patient Care Team: Gayland Curry, DO as PCP - General (Geriatric Medicine)  Extended Emergency Contact Information Primary Emergency Contact: Ortiz,Ryan Address: [address]          [place], [place] [address] [name] of [place] Phone: [phone] Mobile Phone: [phone] Relation: Spouse  Code Status:  DNR Goals of care: Advanced Directive information Advanced Directives 03/26/2018  Does Patient Have a Medical Advance Directive? Yes  Type of Paramedic of [name];Living will;Out of facility DNR (pink MOST or yellow form)  Does patient want to make changes to medical advance directive? No - Patient declined  Copy of [place] in Chart? Yes  Would patient like information on creating a medical advance directive? -  Pre-existing out of facility DNR order (yellow form or pink MOST form) Yellow form placed in chart (order not valid for inpatient use)     Chief Complaint  Patient presents with  . Medical Management of Chronic Issues    Routine Visit    HPI:  Pt is a 77 y.o. male seen today for medical management of chronic diseases.    Only new issues were rash to buttocks for which he's been seen by derm with a new order for hydrocortisone cream as needed.   Nursing notes also show a day where he went to a meal and came back angry.  He was then not following instructions on the standup lift (not putting his feet flat). There are no more notes about behaviors or anger for the month.  Unclear if he was truly resisting or unable to follow the instructions given his gait apraxia.  He remains on zoloft for depression which was increased a few months ago.   He gets frustrated that people are not always patient  with him when he's trying to find words.  He talks less and less.  He is on aricept and namenda for his dementia.  He was previously followed at [facility], Dr. Laurelyn Ortiz.    Pain seems to be controlled with voltaren gel for achy joints.   DMII:  On metformin daily with breakfast. Is on baby asa and ace inhibitor.     Gout:  No flares reported.  Remains on allopurinol.  Past Medical History:  Diagnosis Date  . Abnormality of gait 04/05/2015  . Benign prostatic hypertrophy   . Dementia   . Diabetes mellitus type II    no meds  . Elevated PSA   . Erectile dysfunction   . Gout   . Hereditary and idiopathic peripheral neuropathy 08/02/2016  . Hypertension   . Memory changes   . Memory difficulties 04/05/2015  . Nephrolithiasis    hx of  . Obstructive sleep apnea   . Partial Achilles tendon tear   . Rosacea, acne   . Tremor 04/05/2015   Past Surgical History:  Procedure Laterality Date  . CARPAL TUNNEL RELEASE  2004  . CARPAL TUNNEL RELEASE  10/31/2011   Procedure: CARPAL TUNNEL RELEASE;  Surgeon: [name]., MD;  Location: [facility];  Service: Orthopedics;  Laterality: Left;  . EYE SURGERY     cataracts  . HAND SURGERY  2007   right hand  . INGUINAL HERNIA REPAIR    .  JOINT REPLACEMENT     left total knee  . KNEE SURGERY  1961   left knee  . TONSILLECTOMY    . TONSILLECTOMY AND ADENOIDECTOMY    . TRANSURETHRAL RESECTION OF PROSTATE      Allergies  Allergen Reactions  . Ciprofloxacin     REACTION: Diarrhea,l rectal irritation    Outpatient Encounter Medications as of 03/26/2018  Medication Sig  . allopurinol (ZYLOPRIM) 100 MG tablet Take 100 mg by mouth daily.   Marland Kitchen amLODipine (NORVASC) 10 MG tablet Take 10 mg by mouth daily.  Marland Kitchen aspirin 81 MG tablet Take 81 mg by mouth daily.  . Cholecalciferol (VITAMIN D-3 PO) Take 2,000 Units by mouth daily.   . diclofenac sodium (VOLTAREN) 1 % GEL Apply 2 g topically daily as needed.  . donepezil (ARICEPT) 10 MG  tablet Take 10 mg by mouth at bedtime.  . finasteride (PROSCAR) 5 MG tablet Take 5 mg by mouth at bedtime.   Marland Kitchen lisinopril (PRINIVIL,ZESTRIL) 40 MG tablet Take 40 mg by mouth daily.   . memantine (NAMENDA) 10 MG tablet Take 1 tablet by mouth 2 (two) times daily.  . metFORMIN (GLUCOPHAGE) 500 MG tablet Take 500 mg by mouth daily with breakfast.  . pantoprazole (PROTONIX) 40 MG tablet Take 40 mg by mouth daily.   . sertraline (ZOLOFT) 100 MG tablet Take 100 mg by mouth at bedtime.  . [DISCONTINUED] sertraline (ZOLOFT) 50 MG tablet Take 75 mg by mouth daily.   No facility-administered encounter medications on file as of 03/26/2018.     Review of Systems  Constitutional: Negative for activity change, appetite change, chills and fever.  HENT: Negative for congestion.   Eyes: Negative for visual disturbance.  Respiratory: Negative for chest tightness and shortness of breath.   Cardiovascular: Negative for chest pain, palpitations and leg swelling.  Gastrointestinal: Negative for abdominal pain, constipation, diarrhea, nausea and vomiting.  Genitourinary: Negative for dysuria.  Musculoskeletal: Positive for arthralgias and gait problem.  Skin: Negative for color change.  Allergic/Immunologic:       Diabetes  Neurological: Positive for weakness. Negative for dizziness.  Hematological: Does not bruise/bleed easily.  Psychiatric/Behavioral: Positive for confusion. Negative for agitation and behavioral problems. The patient is not nervous/anxious.     Immunization History  Administered Date(s) Administered  . Influenza Split 08/18/2011, 08/08/2012  . Influenza Whole 08/21/2006, 07/11/2007, 09/13/2009, 07/13/2010  . Influenza, Seasonal, Injecte, Preservative Fre 07/10/2013  . Influenza-Unspecified 08/08/2012, 07/17/2014, 08/10/2014, 06/16/2015, 07/30/2017  . Pneumococcal Conjugate-13 01/06/2014  . Pneumococcal Polysaccharide-23 03/12/2012, 03/10/2014  . Td 10/09/1992  . Tdap 03/12/2012,  01/08/2014  . Zoster 12/26/2007, 03/10/2014  . Zoster Recombinat (Shingrix) 01/23/2018   Pertinent  Health Maintenance Due  Topic Date Due  . OPHTHALMOLOGY EXAM  12/12/2016  . INFLUENZA VACCINE  05/09/2018  . HEMOGLOBIN A1C  05/21/2018  . FOOT EXAM  01/08/2019  . PNA vac Low Risk Adult  Completed  . COLONOSCOPY  Discontinued   No flowsheet data found. Functional Status Survey:    Vitals:   03/26/18 1443  BP: (!) 148/68  Pulse: 62  Resp: 18  Temp: 98 F (36.7 C)  TempSrc: Oral  SpO2: 99%  Weight: 268 lb (121.6 kg)  Height: 6\' 3"  (1.905 m)   Body mass index is 33.5 kg/m. Physical Exam  Constitutional: He appears well-developed and well-nourished. No distress.  HENT:  Head: Normocephalic and atraumatic.  Cardiovascular: Normal rate, regular rhythm, normal heart sounds and intact distal pulses.  Pulmonary/Chest: Effort normal and  breath sounds normal. No respiratory distress.  Abdominal: Bowel sounds are normal.  Musculoskeletal: Normal range of motion.  Neurological: He is alert.  Skin: Skin is warm and dry. Capillary refill takes less than 2 seconds.    Labs reviewed: Recent Labs    11/12/17 1718 11/21/17  NA 138 143  K 4.0 4.7  CL 105  --   CO2 25  --   GLUCOSE 182*  --   BUN 18 22*  CREATININE 1.47* 1.4*  CALCIUM 8.8*  --    Recent Labs    11/12/17 1718  AST 24  ALT 20  ALKPHOS 72  BILITOT 0.5  PROT 6.5  ALBUMIN 3.8   Recent Labs    11/12/17 1718  WBC 7.8  NEUTROABS 4.7  HGB 11.3*  HCT 34.8*  MCV 65.3*  PLT 172   Lab Results  Component Value Date   TSH 0.51 10/01/2012   Lab Results  Component Value Date   HGBA1C 7.8 11/21/2017   Lab Results  Component Value Date   CHOL 151 11/11/2013   HDL 33.00 (L) 11/11/2013   LDLCALC 96 11/11/2013   LDLDIRECT 97.8 05/26/2009   TRIG 112.0 11/11/2013   CHOLHDL 5 11/11/2013    Assessment/Plan 1. Mixed Alzheimer's and vascular dementia -cont aricept and namenda, supportive care in  SNF -frustrated with his aphasia  2. Diabetes mellitus type 2 without retinopathy ([place]) -cont metformin Lab Results  Component Value Date   HGBA1C 7.8 11/21/2017  -f/u hba1c in august at 6 months  3. Reactive depression -cont zoloft therapy, caregiver support, encouraging activities   4. Benign hypertension with chronic kidney disease, stage III (HCC) -today's bp slightly elevated, but no reports to me about bps otherwise running high  5. Cervical spondylosis with myelopathy -has affected his ability to ambulate and care for himself along with his dementia and gait apraxia  6. Gait apraxia -pt cannot always remember how to do things like walking so patience necessary to help with transfers  Family/ staff Communication: discussed with SNF nurse  Labs/tests ordered:  No new; bmp, hba1c due august  Dawsyn Ramsaran L. Domonik Levario, D.O. [facility] [address], [place] [address] Cell Phone (Mon-Fri 8am-5pm):  [phone] On Call:  [phone] & follow prompts after 5pm & weekends Office Phone:  [phone] Office Fax:  [phone]

## 2018-03-28 DIAGNOSIS — R4189 Other symptoms and signs involving cognitive functions and awareness: Secondary | ICD-10-CM | POA: Diagnosis not present

## 2018-03-28 DIAGNOSIS — F0150 Vascular dementia without behavioral disturbance: Secondary | ICD-10-CM | POA: Diagnosis not present

## 2018-03-28 DIAGNOSIS — F0280 Dementia in other diseases classified elsewhere without behavioral disturbance: Secondary | ICD-10-CM | POA: Diagnosis not present

## 2018-03-28 DIAGNOSIS — R258 Other abnormal involuntary movements: Secondary | ICD-10-CM | POA: Diagnosis not present

## 2018-03-28 DIAGNOSIS — R278 Other lack of coordination: Secondary | ICD-10-CM | POA: Diagnosis not present

## 2018-03-28 DIAGNOSIS — R633 Feeding difficulties: Secondary | ICD-10-CM | POA: Diagnosis not present

## 2018-04-01 DIAGNOSIS — R4189 Other symptoms and signs involving cognitive functions and awareness: Secondary | ICD-10-CM | POA: Diagnosis not present

## 2018-04-01 DIAGNOSIS — F0280 Dementia in other diseases classified elsewhere without behavioral disturbance: Secondary | ICD-10-CM | POA: Diagnosis not present

## 2018-04-01 DIAGNOSIS — F0150 Vascular dementia without behavioral disturbance: Secondary | ICD-10-CM | POA: Diagnosis not present

## 2018-04-01 DIAGNOSIS — R278 Other lack of coordination: Secondary | ICD-10-CM | POA: Diagnosis not present

## 2018-04-01 DIAGNOSIS — R633 Feeding difficulties: Secondary | ICD-10-CM | POA: Diagnosis not present

## 2018-04-01 DIAGNOSIS — R258 Other abnormal involuntary movements: Secondary | ICD-10-CM | POA: Diagnosis not present

## 2018-04-03 DIAGNOSIS — R258 Other abnormal involuntary movements: Secondary | ICD-10-CM | POA: Diagnosis not present

## 2018-04-03 DIAGNOSIS — E119 Type 2 diabetes mellitus without complications: Secondary | ICD-10-CM | POA: Diagnosis not present

## 2018-04-03 DIAGNOSIS — R633 Feeding difficulties: Secondary | ICD-10-CM | POA: Diagnosis not present

## 2018-04-03 DIAGNOSIS — F0280 Dementia in other diseases classified elsewhere without behavioral disturbance: Secondary | ICD-10-CM | POA: Diagnosis not present

## 2018-04-03 DIAGNOSIS — R278 Other lack of coordination: Secondary | ICD-10-CM | POA: Diagnosis not present

## 2018-04-03 DIAGNOSIS — D3132 Benign neoplasm of left choroid: Secondary | ICD-10-CM | POA: Diagnosis not present

## 2018-04-03 DIAGNOSIS — H11001 Unspecified pterygium of right eye: Secondary | ICD-10-CM | POA: Diagnosis not present

## 2018-04-03 DIAGNOSIS — Z961 Presence of intraocular lens: Secondary | ICD-10-CM | POA: Diagnosis not present

## 2018-04-03 DIAGNOSIS — F0150 Vascular dementia without behavioral disturbance: Secondary | ICD-10-CM | POA: Diagnosis not present

## 2018-04-03 DIAGNOSIS — R4189 Other symptoms and signs involving cognitive functions and awareness: Secondary | ICD-10-CM | POA: Diagnosis not present

## 2018-04-03 LAB — HM DIABETES EYE EXAM

## 2018-04-08 DIAGNOSIS — R258 Other abnormal involuntary movements: Secondary | ICD-10-CM | POA: Diagnosis not present

## 2018-04-08 DIAGNOSIS — R4189 Other symptoms and signs involving cognitive functions and awareness: Secondary | ICD-10-CM | POA: Diagnosis not present

## 2018-04-08 DIAGNOSIS — R278 Other lack of coordination: Secondary | ICD-10-CM | POA: Diagnosis not present

## 2018-04-08 DIAGNOSIS — F0150 Vascular dementia without behavioral disturbance: Secondary | ICD-10-CM | POA: Diagnosis not present

## 2018-04-08 DIAGNOSIS — F0280 Dementia in other diseases classified elsewhere without behavioral disturbance: Secondary | ICD-10-CM | POA: Diagnosis not present

## 2018-04-08 DIAGNOSIS — R633 Feeding difficulties: Secondary | ICD-10-CM | POA: Diagnosis not present

## 2018-04-15 ENCOUNTER — Encounter: Payer: Self-pay | Admitting: Adult Health

## 2018-04-15 ENCOUNTER — Non-Acute Institutional Stay (SKILLED_NURSING_FACILITY): Payer: Medicare Other | Admitting: Adult Health

## 2018-04-15 DIAGNOSIS — N401 Enlarged prostate with lower urinary tract symptoms: Secondary | ICD-10-CM

## 2018-04-15 DIAGNOSIS — N183 Chronic kidney disease, stage 3 unspecified: Secondary | ICD-10-CM

## 2018-04-15 DIAGNOSIS — F329 Major depressive disorder, single episode, unspecified: Secondary | ICD-10-CM

## 2018-04-15 DIAGNOSIS — I129 Hypertensive chronic kidney disease with stage 1 through stage 4 chronic kidney disease, or unspecified chronic kidney disease: Secondary | ICD-10-CM | POA: Diagnosis not present

## 2018-04-15 DIAGNOSIS — F0280 Dementia in other diseases classified elsewhere without behavioral disturbance: Secondary | ICD-10-CM | POA: Diagnosis not present

## 2018-04-15 DIAGNOSIS — G4733 Obstructive sleep apnea (adult) (pediatric): Secondary | ICD-10-CM | POA: Diagnosis not present

## 2018-04-15 DIAGNOSIS — E119 Type 2 diabetes mellitus without complications: Secondary | ICD-10-CM

## 2018-04-15 DIAGNOSIS — G309 Alzheimer's disease, unspecified: Secondary | ICD-10-CM

## 2018-04-15 DIAGNOSIS — F0150 Vascular dementia without behavioral disturbance: Secondary | ICD-10-CM | POA: Diagnosis not present

## 2018-04-15 DIAGNOSIS — M1A30X0 Chronic gout due to renal impairment, unspecified site, without tophus (tophi): Secondary | ICD-10-CM | POA: Diagnosis not present

## 2018-04-15 NOTE — Assessment & Plan Note (Signed)
Improved Continue zoloft 100 mg qd

## 2018-04-15 NOTE — Assessment & Plan Note (Signed)
Continue CPAP.  

## 2018-04-15 NOTE — Assessment & Plan Note (Signed)
Continue aricept and namenda.   

## 2018-04-15 NOTE — Assessment & Plan Note (Signed)
No symptoms No aggressive PSA monitoring due to dementia/debility Continue Proscar

## 2018-04-15 NOTE — Assessment & Plan Note (Signed)
Continue metformin Monitor A1C q 6 months.

## 2018-04-15 NOTE — Assessment & Plan Note (Signed)
No new events, continue allopurinol 100 mg qd.

## 2018-04-15 NOTE — Assessment & Plan Note (Signed)
Continue to periodically monitor BMP and avoid nephrotoxic agents

## 2018-04-15 NOTE — Progress Notes (Signed)
Location:  Occupational psychologist of Service:  SNF (31) Provider:   Cindi Carbon, ANP [place] [phone]   Gayland Curry, DO  Patient Care Team: Gayland Curry, DO as PCP - General (Geriatric Medicine)  Extended Emergency Contact Information Primary Emergency Contact: Escoe,[name] Address: [address]          [place], [place] [address] [name] of [place] Phone: [phone] Mobile Phone: [phone] Relation: Spouse  Code Status:  DNR Goals of care: Advanced Directive information Advanced Directives 03/26/2018  Does Patient Have a Medical Advance Directive? Yes  Type of Paramedic of [place];Living will;Out of facility DNR (pink MOST or yellow form)  Does patient want to make changes to medical advance directive? No - Patient declined  Copy of [place] in Chart? Yes  Would patient like information on creating a medical advance directive? -  Pre-existing out of facility DNR order (yellow form or pink MOST form) Yellow form placed in chart (order not valid for inpatient use)     Chief Complaint  Patient presents with  . Medical Management of Chronic Issues    HPI:  Pt is a 77 y.o. male seen today for medical management of chronic diseases.    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 He is slightly more verbal and animated and was able to tell me today that he lived in [place] and but did not know the date or year or location.   Depression: Zoloft was increased to 100 mg due to s/s of depression related to his move to skilled care.  I have noticed that he is slightly more upbeat and talkative. However, he continues to believe that his wife does not come to see him and he perseverates over her. She visits  regularly but he forgets this and thinks she has left him.   HTN: BP range 129-156/66-81, no edema sob or cp  DM II: CBG range 138-168 Lab Results  Component Value Date   HGBA1C 7.8 11/21/2017   Sleep apnea: wears mask when sleeping, denies any issues sleeping or daytime grogginess  BPH: no issues with urination, mostly incontinent, hx of elevated PSA  GOUT: no new issues Functional status: lift for transfers, incontinent most of the time Past Medical History:  Diagnosis Date  . Abnormality of gait 04/05/2015  . Benign prostatic hypertrophy   . Dementia   . Diabetes mellitus type II    no meds  . Elevated PSA   . Erectile dysfunction   . Gout   . Hereditary and idiopathic peripheral neuropathy 08/02/2016  . Hypertension   . Memory changes   . Memory difficulties 04/05/2015  . Nephrolithiasis    hx of  . Obstructive sleep apnea   . Partial Achilles tendon tear   . Rosacea, acne   . Tremor 04/05/2015   Past Surgical History:  Procedure Laterality Date  . CARPAL TUNNEL RELEASE  2004  . CARPAL TUNNEL RELEASE  10/31/2011   Procedure: CARPAL TUNNEL RELEASE;  Surgeon: Cammie Sickle., MD;  Location: [facility];  Service: Orthopedics;  Laterality: Left;  . EYE SURGERY     cataracts  . HAND SURGERY  2007   right hand  . INGUINAL HERNIA REPAIR    .  JOINT REPLACEMENT     left total knee  . KNEE SURGERY  1961   left knee  . TONSILLECTOMY    . TONSILLECTOMY AND ADENOIDECTOMY    . TRANSURETHRAL RESECTION OF PROSTATE      Allergies  Allergen Reactions  . Ciprofloxacin     REACTION: Diarrhea,l rectal irritation    Outpatient Encounter Medications as of 04/15/2018  Medication Sig  . allopurinol (ZYLOPRIM) 100 MG tablet Take 100 mg by mouth daily.   Marland Kitchen amLODipine (NORVASC) 10 MG tablet Take 10 mg by mouth daily.  Marland Kitchen aspirin 81 MG tablet Take 81 mg by mouth daily.  . Cholecalciferol (VITAMIN D-3 PO) Take 2,000 Units by mouth daily.   . diclofenac sodium  (VOLTAREN) 1 % GEL Apply 2 g topically daily as needed.  . donepezil (ARICEPT) 10 MG tablet Take 10 mg by mouth at bedtime.  . finasteride (PROSCAR) 5 MG tablet Take 5 mg by mouth at bedtime.   Marland Kitchen lisinopril (PRINIVIL,ZESTRIL) 40 MG tablet Take 40 mg by mouth daily.   . memantine (NAMENDA) 10 MG tablet Take 1 tablet by mouth 2 (two) times daily.  . metFORMIN (GLUCOPHAGE) 500 MG tablet Take 500 mg by mouth daily with breakfast.  . pantoprazole (PROTONIX) 40 MG tablet Take 40 mg by mouth daily.   . sertraline (ZOLOFT) 100 MG tablet Take 100 mg by mouth at bedtime.   No facility-administered encounter medications on file as of 04/15/2018.     Review of Systems  Constitutional: Negative for activity change, appetite change, chills, diaphoresis, fatigue, fever and unexpected weight change.  HENT: Negative for congestion.   Eyes: Negative for visual disturbance.  Respiratory: Negative for cough, shortness of breath, wheezing and stridor.   Cardiovascular: Negative for chest pain, palpitations and leg swelling.  Gastrointestinal: Negative for abdominal distention, abdominal pain, constipation and diarrhea.  Genitourinary: Negative for difficulty urinating and dysuria.  Musculoskeletal: Positive for arthralgias and gait problem. Negative for back pain, joint swelling and myalgias.  Skin: Negative for wound.  Neurological: Positive for weakness (general). Negative for dizziness, seizures, syncope, facial asymmetry, speech difficulty and headaches.  Hematological: Negative for adenopathy. Does not bruise/bleed easily.  Psychiatric/Behavioral: Positive for confusion and dysphoric mood. Negative for agitation, behavioral problems and sleep disturbance. The patient is not nervous/anxious and is not hyperactive.     Immunization History  Administered Date(s) Administered  . Influenza Split 08/18/2011, 08/08/2012  . Influenza Whole 08/21/2006, 07/11/2007, 09/13/2009, 07/13/2010  . Influenza, Seasonal,  Injecte, Preservative Fre 07/10/2013  . Influenza-Unspecified 08/08/2012, 07/17/2014, 08/10/2014, 06/16/2015, 07/30/2017  . Pneumococcal Conjugate-13 01/06/2014  . Pneumococcal Polysaccharide-23 03/12/2012, 03/10/2014  . Td 10/09/1992  . Tdap 03/12/2012, 01/08/2014  . Zoster 12/26/2007, 03/10/2014  . Zoster Recombinat (Shingrix) 01/23/2018   Pertinent  Health Maintenance Due  Topic Date Due  . INFLUENZA VACCINE  05/09/2018  . HEMOGLOBIN A1C  05/21/2018  . FOOT EXAM  01/08/2019  . OPHTHALMOLOGY EXAM  04/04/2019  . PNA vac Low Risk Adult  Completed  . COLONOSCOPY  Discontinued   No flowsheet data found. Functional Status Survey:    Vitals:   04/15/18 1533  Weight: 268 lb 8 oz (121.8 kg)   Body mass index is 33.56 kg/m.  Wt Readings from Last 3 Encounters:  04/15/18 268 lb 8 oz (121.8 kg)  03/26/18 268 lb (121.6 kg)  02/07/18 270 lb 4.8 oz (122.6 kg)   Physical Exam  Constitutional: No distress.  HENT:  Head: Normocephalic and atraumatic.  Neck: No JVD  present. No tracheal deviation present. No thyromegaly present.  Cardiovascular: Normal rate and regular rhythm.  No murmur heard. Pulmonary/Chest: Effort normal and breath sounds normal. No respiratory distress. He has no wheezes.  Abdominal: Soft. Bowel sounds are normal. He exhibits no distension. There is no tenderness.  Musculoskeletal: He exhibits no edema or tenderness.  Lymphadenopathy:    He has no cervical adenopathy.  Neurological: He is alert.  Oriented to self, not place or time. Able to f/c and intermittently answer q's  Skin: Skin is warm and dry. He is not diaphoretic.  Psychiatric: He has a normal mood and affect.    Labs reviewed: Recent Labs    11/12/17 1718 11/21/17  NA 138 143  K 4.0 4.7  CL 105  --   CO2 25  --   GLUCOSE 182*  --   BUN 18 22*  CREATININE 1.47* 1.4*  CALCIUM 8.8*  --    Recent Labs    11/12/17 1718  AST 24  ALT 20  ALKPHOS 72  BILITOT 0.5  PROT 6.5  ALBUMIN 3.8     Recent Labs    11/12/17 1718  WBC 7.8  NEUTROABS 4.7  HGB 11.3*  HCT 34.8*  MCV 65.3*  PLT 172   Lab Results  Component Value Date   TSH 0.51 10/01/2012   Lab Results  Component Value Date   HGBA1C 7.8 11/21/2017   Lab Results  Component Value Date   CHOL 151 11/11/2013   HDL 33.00 (L) 11/11/2013   LDLCALC 96 11/11/2013   LDLDIRECT 97.8 05/26/2009   TRIG 112.0 11/11/2013   CHOLHDL 5 11/11/2013    Significant Diagnostic Results in last 30 days:  No results found.  Assessment/Plan  Benign hypertension with chronic kidney disease, stage III (HCC) Most numbers are wnl range for his age and debility (<150/90) Continue norvasc and lisinopril  Chronic kidney disease, stage III (moderate) (HCC) Continue to periodically monitor BMP and avoid nephrotoxic agents   Diabetes mellitus type 2 without retinopathy (HCC) Continue metformin Monitor A1C q 6 months.   Severe obstructive sleep apnea Continue CPAP  Benign non-nodular prostatic hyperplasia with lower urinary tract symptoms No symptoms No aggressive PSA monitoring due to dementia/debility Continue Proscar   GOUT No new events, continue allopurinol 100 mg qd.   Depression Improved Continue zoloft 100 mg qd  Mixed Alzheimer's and vascular dementia Continue aricept and namenda   Family/ staff Communication: staff/resident Labs/tests ordered:  NA

## 2018-04-15 NOTE — Assessment & Plan Note (Signed)
Most numbers are wnl range for his age and debility (<150/90) Continue norvasc and lisinopril

## 2018-04-30 ENCOUNTER — Non-Acute Institutional Stay (SKILLED_NURSING_FACILITY): Payer: Medicare Other

## 2018-04-30 DIAGNOSIS — Z0000 Encounter for general adult medical examination without abnormal findings: Secondary | ICD-10-CM | POA: Diagnosis not present

## 2018-04-30 NOTE — Patient Instructions (Signed)
[name] , Thank you for taking time to come for your Medicare Wellness Visit. I appreciate your ongoing commitment to your health goals. Please review the following plan we discussed and let me know if I can assist you in the future.   Screening recommendations/referrals: Colonoscopy excluded, over age 79 Recommended yearly ophthalmology/optometry visit for glaucoma screening and checkup Recommended yearly dental visit for hygiene and checkup  Vaccinations: Influenza vaccine up to date, due 2019 fall season Pneumococcal vaccine up to date, completed Tdap vaccine up to date, due 03/10/2024 Shingles vaccine waiting on second shot    Advanced directives: in chart  Conditions/risks identified: none  Next appointment: Dr. Mariea Clonts makes rounds  Preventive Care 13 Years and Older, Male Preventive care refers to lifestyle choices and visits with your health care provider that can promote health and wellness. What does preventive care include?  A yearly physical exam. This is also called an annual well check.  Dental exams once or twice a year.  Routine eye exams. Ask your health care provider how often you should have your eyes checked.  Personal lifestyle choices, including:  Daily care of your teeth and gums.  Regular physical activity.  Eating a healthy diet.  Avoiding tobacco and drug use.  Limiting alcohol use.  Practicing safe sex.  Taking low doses of aspirin every day.  Taking vitamin and mineral supplements as recommended by your health care provider. What happens during an annual well check? The services and screenings done by your health care provider during your annual well check will depend on your age, overall health, lifestyle risk factors, and family history of disease. Counseling  Your health care provider may ask you questions about your:  Alcohol use.  Tobacco use.  Drug use.  Emotional well-being.  Home and relationship well-being.  Sexual  activity.  Eating habits.  History of falls.  Memory and ability to understand (cognition).  Work and work Statistician. Screening  You may have the following tests or measurements:  Height, weight, and BMI.  Blood pressure.  Lipid and cholesterol levels. These may be checked every 5 years, or more frequently if you are over 76 years old.  Skin check.  Lung cancer screening. You may have this screening every year starting at age 38 if you have a 30-pack-year history of smoking and currently smoke or have quit within the past 15 years.  Fecal occult blood test (FOBT) of the stool. You may have this test every year starting at age 80.  Flexible sigmoidoscopy or colonoscopy. You may have a sigmoidoscopy every 5 years or a colonoscopy every 10 years starting at age 35.  Prostate cancer screening. Recommendations will vary depending on your family history and other risks.  Hepatitis C blood test.  Hepatitis B blood test.  Sexually transmitted disease (STD) testing.  Diabetes screening. This is done by checking your blood sugar (glucose) after you have not eaten for a while (fasting). You may have this done every 1-3 years.  Abdominal aortic aneurysm (AAA) screening. You may need this if you are a current or former smoker.  Osteoporosis. You may be screened starting at age 64 if you are at high risk. Talk with your health care provider about your test results, treatment options, and if necessary, the need for more tests. Vaccines  Your health care provider may recommend certain vaccines, such as:  Influenza vaccine. This is recommended every year.  Tetanus, diphtheria, and acellular pertussis (Tdap, Td) vaccine. You may need a  Td booster every 10 years.  Zoster vaccine. You may need this after age 86.  Pneumococcal 13-valent conjugate (PCV13) vaccine. One dose is recommended after age 70.  Pneumococcal polysaccharide (PPSV23) vaccine. One dose is recommended after age  69. Talk to your health care provider about which screenings and vaccines you need and how often you need them. This information is not intended to replace advice given to you by your health care provider. Make sure you discuss any questions you have with your health care provider. Document Released: 10/22/2015 Document Revised: 06/14/2016 Document Reviewed: 07/27/2015 Elsevier Interactive Patient Education  2017 [place] Prevention in the Home Falls can cause injuries. They can happen to people of all ages. There are many things you can do to make your home safe and to help prevent falls. What can I do on the outside of my home?  Regularly fix the edges of walkways and driveways and fix any cracks.  Remove anything that might make you trip as you walk through a door, such as a raised step or threshold.  Trim any bushes or trees on the path to your home.  Use bright outdoor lighting.  Clear any walking paths of anything that might make someone trip, such as rocks or tools.  Regularly check to see if handrails are loose or broken. Make sure that both sides of any steps have handrails.  Any raised decks and porches should have guardrails on the edges.  Have any leaves, snow, or ice cleared regularly.  Use sand or salt on walking paths during winter.  Clean up any spills in your garage right away. This includes oil or grease spills. What can I do in the bathroom?  Use night lights.  Install grab bars by the toilet and in the tub and shower. Do not use towel bars as grab bars.  Use non-skid mats or decals in the tub or shower.  If you need to sit down in the shower, use a plastic, non-slip stool.  Keep the floor dry. Clean up any water that spills on the floor as soon as it happens.  Remove soap buildup in the tub or shower regularly.  Attach bath mats securely with double-sided non-slip rug tape.  Do not have throw rugs and other things on the floor that can make  you trip. What can I do in the bedroom?  Use night lights.  Make sure that you have a light by your bed that is easy to reach.  Do not use any sheets or blankets that are too big for your bed. They should not hang down onto the floor.  Have a firm chair that has side arms. You can use this for support while you get dressed.  Do not have throw rugs and other things on the floor that can make you trip. What can I do in the kitchen?  Clean up any spills right away.  Avoid walking on wet floors.  Keep items that you use a lot in easy-to-reach places.  If you need to reach something above you, use a strong step stool that has a grab bar.  Keep electrical cords out of the way.  Do not use floor polish or wax that makes floors slippery. If you must use wax, use non-skid floor wax.  Do not have throw rugs and other things on the floor that can make you trip. What can I do with my stairs?  Do not leave any items on the stairs.  Make sure that there are handrails on both sides of the stairs and use them. Fix handrails that are broken or loose. Make sure that handrails are as long as the stairways.  Check any carpeting to make sure that it is firmly attached to the stairs. Fix any carpet that is loose or worn.  Avoid having throw rugs at the top or bottom of the stairs. If you do have throw rugs, attach them to the floor with carpet tape.  Make sure that you have a light switch at the top of the stairs and the bottom of the stairs. If you do not have them, ask someone to add them for you. What else can I do to help prevent falls?  Wear shoes that:  Do not have high heels.  Have rubber bottoms.  Are comfortable and fit you well.  Are closed at the toe. Do not wear sandals.  If you use a stepladder:  Make sure that it is fully opened. Do not climb a closed stepladder.  Make sure that both sides of the stepladder are locked into place.  Ask someone to hold it for you, if  possible.  Clearly mark and make sure that you can see:  Any grab bars or handrails.  First and last steps.  Where the edge of each step is.  Use tools that help you move around (mobility aids) if they are needed. These include:  Canes.  Walkers.  Scooters.  Crutches.  Turn on the lights when you go into a dark area. Replace any light bulbs as soon as they burn out.  Set up your furniture so you have a clear path. Avoid moving your furniture around.  If any of your floors are uneven, fix them.  If there are any pets around you, be aware of where they are.  Review your medicines with your doctor. Some medicines can make you feel dizzy. This can increase your chance of falling. Ask your doctor what other things that you can do to help prevent falls. This information is not intended to replace advice given to you by your health care provider. Make sure you discuss any questions you have with your health care provider. Document Released: 07/22/2009 Document Revised: 03/02/2016 Document Reviewed: 10/30/2014 Elsevier Interactive Patient Education  2017 [employer].

## 2018-04-30 NOTE — Progress Notes (Signed)
Subjective:   [name] is a 77 y.o. male who presents for Medicare Annual/Subsequent preventive examination at [facility];.incapacitated patient unable to answer questions appropriately  Last AWV-04/26/2017    Objective:    Vitals: BP 138/68 (BP Location: Left Arm, Patient Position: Sitting)   Pulse 65   Temp 98.2 F (36.8 C) (Oral)   Ht 6\' 3"  (1.905 m)   Wt 269 lb (122 kg)   SpO2 98%   BMI 33.62 kg/m   Body mass index is 33.62 kg/m.  Advanced Directives 04/30/2018 03/26/2018 01/01/2018 11/20/2017 11/12/2017 07/28/2015 04/05/2015  Does Patient Have a Medical Advance Directive? Yes Yes Yes Yes No Yes Yes  Type of Paramedic of [place];Living will;Out of facility DNR (pink MOST or yellow form) [place];Living will;Out of facility DNR (pink MOST or yellow form) Out of facility DNR (pink MOST or yellow form);Living will;Healthcare Power of Attorney Out of facility DNR (pink MOST or yellow form);[place];Living will - [place];Living will [place];Living will  Does patient want to make changes to medical advance directive? No - Patient declined No - Patient declined No - Patient declined No - Patient declined - - -  Copy of [place] in Chart? Yes Yes Yes Yes - - -  Would patient like information on creating a medical advance directive? - - No - Patient declined No - Patient declined No - Patient declined - -  Pre-existing out of facility DNR order (yellow form or pink MOST form) Yellow form placed in chart (order not valid for inpatient use) Yellow form placed in chart (order not valid for inpatient use) Yellow form placed in chart (order not valid for inpatient use) Yellow form placed in chart (order not valid for inpatient use) - - -    Tobacco Social History   Tobacco Use  Smoking Status Never Smoker  Smokeless Tobacco Never Used     Counseling  given: Not Answered   Clinical Intake:  Pre-visit preparation completed: No  Pain : No/denies pain     Nutritional Risks: None Diabetes: Yes CBG done?: No Did pt. bring in CBG monitor from home?: No  How often do you need to have someone help you when you read instructions, pamphlets, or other written materials from your doctor or pharmacy?: 4 - Often  Interpreter Needed?: No  Information entered by :: Tyson Dense, RN  Past Medical History:  Diagnosis Date  . Abnormality of gait 04/05/2015  . Benign prostatic hypertrophy   . Dementia   . Diabetes mellitus type II    no meds  . Elevated PSA   . Erectile dysfunction   . Gout   . Hereditary and idiopathic peripheral neuropathy 08/02/2016  . Hypertension   . Memory changes   . Memory difficulties 04/05/2015  . Nephrolithiasis    hx of  . Obstructive sleep apnea   . Partial Achilles tendon tear   . Rosacea, acne   . Tremor 04/05/2015   Past Surgical History:  Procedure Laterality Date  . CARPAL TUNNEL RELEASE  2004  . CARPAL TUNNEL RELEASE  10/31/2011   Procedure: CARPAL TUNNEL RELEASE;  Surgeon: Cammie Sickle., MD;  Location: [facility];  Service: Orthopedics;  Laterality: Left;  . EYE SURGERY     cataracts  . HAND SURGERY  2007   right hand  . INGUINAL HERNIA REPAIR    . JOINT  REPLACEMENT     left total knee  . KNEE SURGERY  1961   left knee  . TONSILLECTOMY    . TONSILLECTOMY AND ADENOIDECTOMY    . TRANSURETHRAL RESECTION OF PROSTATE     Family History  Problem Relation Age of Onset  . Heart disease Mother   . Dementia Sister   . COPD Sister   . Diabetes Sister   . Thalassemia Son   . Heart disease Paternal Grandfather   . Dementia Cousin   . Colon cancer Neg Hx   . Stomach cancer Neg Hx    Social History   Socioeconomic History  . Marital status: Married    Spouse name: Not on file  . Number of children: 1  . Years of education: MBA  . Highest education level: Not on  file  Occupational History    Employer: RETIRED  Social Needs  . Financial resource strain: Not hard at all  . Food insecurity:    Worry: Never true    Inability: Never true  . Transportation needs:    Medical: No    Non-medical: No  Tobacco Use  . Smoking status: Never Smoker  . Smokeless tobacco: Never Used  Substance and Sexual Activity  . Alcohol use: No    Comment: rarely  . Drug use: No  . Sexual activity: Not on file  Lifestyle  . Physical activity:    Days per week: 0 days    Minutes per session: 0 min  . Stress: Not on file  Relationships  . Social connections:    Talks on phone: Not on file    Gets together: Not on file    Attends religious service: Not on file    Active member of club or organization: Not on file    Attends meetings of clubs or organizations: Not on file    Relationship status: Not on file  Other Topics Concern  . Not on file  Social History Narrative   Regular exercise.    Avoid Ibuprofen, naproxen, NSAIDS.   Designated Party Release signed on 03/03/2010.      Lives at [place] w/ his wife   Patient drinks about 4-5 cups of caffeine daily.   Patient is right handed.    Outpatient Encounter Medications as of 04/30/2018  Medication Sig  . allopurinol (ZYLOPRIM) 100 MG tablet Take 100 mg by mouth daily.   Marland Kitchen amLODipine (NORVASC) 10 MG tablet Take 10 mg by mouth daily.  Marland Kitchen aspirin 81 MG tablet Take 81 mg by mouth daily.  . Cholecalciferol (VITAMIN D-3 PO) Take 2,000 Units by mouth daily.   . diclofenac sodium (VOLTAREN) 1 % GEL Apply 2 g topically daily as needed.  . donepezil (ARICEPT) 10 MG tablet Take 10 mg by mouth at bedtime.  . finasteride (PROSCAR) 5 MG tablet Take 5 mg by mouth at bedtime.   Marland Kitchen lisinopril (PRINIVIL,ZESTRIL) 40 MG tablet Take 40 mg by mouth daily.   . memantine (NAMENDA) 10 MG tablet Take 1 tablet by mouth 2 (two) times daily.  . metFORMIN (GLUCOPHAGE) 500 MG tablet Take 500 mg by mouth daily with breakfast.  .  pantoprazole (PROTONIX) 40 MG tablet Take 40 mg by mouth daily.   . sertraline (ZOLOFT) 100 MG tablet Take 100 mg by mouth at bedtime.   No facility-administered encounter medications on file as of 04/30/2018.     Activities of Daily Living In your present state of health, do you have any difficulty performing the following  activities: 04/30/2018  Hearing? N  Vision? N  Difficulty concentrating or making decisions? Y  Walking or climbing stairs? Y  Dressing or bathing? Y  Doing errands, shopping? Y  Preparing Food and eating ? Y  Using the Toilet? Y  In the past six months, have you accidently leaked urine? Y  Do you have problems with loss of bowel control? Y  Managing your Medications? Y  Managing your Finances? Y  Housekeeping or managing your Housekeeping? Y  Some recent data might be hidden    Patient Care Team: Gayland Curry, DO as PCP - General (Geriatric Medicine)   Assessment:   This is a routine wellness examination for Salahuddin.  Exercise Activities and Dietary recommendations Current Exercise Habits: The patient does not participate in regular exercise at present, Exercise limited by: orthopedic condition(s);neurologic condition(s)  Goals    None      Fall Risk Fall Risk  04/30/2018  Falls in the past year? No   Is the patient's home free of loose throw rugs in walkways, pet beds, electrical cords, etc?   yes      Grab bars in the bathroom? yes      Handrails on the stairs?   yes      Adequate lighting?   yes  Depression Screen PHQ 2/9 Scores 04/30/2018  Exception Documentation Medical reason    Cognitive Function MMSE - Mini Mental State Exam 11/16/2017 07/17/2016 01/27/2016 07/28/2015 04/05/2015  Orientation to time 0 0 2 2 3   Orientation to Place 0 4 3 4 4   Registration 2 3 3 3 3   Attention/ Calculation 0 1 2 3 2   Recall 0 0 0 0 0  Language- name 2 objects 1 2 2 2 2   Language- repeat 0 0 1 1 1   Language- follow 3 step command 0 3 3 3 3   Language- read  & follow direction 1 1 1 1 1   Write a sentence 0 1 1 1  0  Copy design 0 1 1 1 1   Total score 4 16 19 21 20         Immunization History  Administered Date(s) Administered  . Influenza Split 08/18/2011, 08/08/2012  . Influenza Whole 08/21/2006, 07/11/2007, 09/13/2009, 07/13/2010  . Influenza, Seasonal, Injecte, Preservative Fre 07/10/2013  . Influenza-Unspecified 08/08/2012, 07/17/2014, 08/10/2014, 06/16/2015, 07/30/2017  . Pneumococcal Conjugate-13 01/06/2014  . Pneumococcal Polysaccharide-23 03/12/2012, 03/10/2014  . Td 10/09/1992  . Tdap 03/12/2012, 01/08/2014  . Zoster 12/26/2007, 03/10/2014  . Zoster Recombinat (Shingrix) 01/23/2018    Qualifies for Shingles Vaccine? Waiting for second shot  Screening Tests Health Maintenance  Topic Date Due  . INFLUENZA VACCINE  05/09/2018  . HEMOGLOBIN A1C  05/21/2018  . FOOT EXAM  01/08/2019  . OPHTHALMOLOGY EXAM  04/04/2019  . TETANUS/TDAP  01/09/2024  . PNA vac Low Risk Adult  Completed  . COLONOSCOPY  Discontinued   Cancer Screenings: Lung: Low Dose CT Chest recommended if Age 48-80 years, 30 pack-year currently smoking OR have quit w/in 15years. Patient does not qualify. Colorectal: excluded  Additional Screenings:  Hepatitis C Screening:declined      Plan:    I have personally reviewed and addressed the Medicare Annual Wellness questionnaire and have noted the following in the patient's chart:  A. Medical and social history B. Use of alcohol, tobacco or illicit drugs  C. Current medications and supplements D. Functional ability and status E.  Nutritional status F.  Physical activity G. Advance directives H. List of other physicians I.  Hospitalizations, surgeries, and ER visits in previous 12 months J.  [place] to include hearing, vision, cognitive, depression L. Referrals and appointments - none  In addition, I am unable to review and discuss with incapacitated patient certain preventive protocols,  quality metrics, and best practice recommendations. A written personalized care plan for preventive services as well as general preventive health recommendations were provided to patient.   See attached scanned questionnaire for additional information.   Signed,   Tyson Dense, RN Nurse Health Advisor  Patient Concerns: None

## 2018-05-09 DIAGNOSIS — R319 Hematuria, unspecified: Secondary | ICD-10-CM | POA: Diagnosis not present

## 2018-05-09 DIAGNOSIS — N390 Urinary tract infection, site not specified: Secondary | ICD-10-CM | POA: Diagnosis not present

## 2018-05-16 ENCOUNTER — Non-Acute Institutional Stay (SKILLED_NURSING_FACILITY): Payer: Medicare Other | Admitting: Adult Health

## 2018-05-16 ENCOUNTER — Encounter: Payer: Self-pay | Admitting: Adult Health

## 2018-05-16 DIAGNOSIS — N183 Chronic kidney disease, stage 3 unspecified: Secondary | ICD-10-CM

## 2018-05-16 DIAGNOSIS — F329 Major depressive disorder, single episode, unspecified: Secondary | ICD-10-CM | POA: Diagnosis not present

## 2018-05-16 DIAGNOSIS — F0150 Vascular dementia without behavioral disturbance: Secondary | ICD-10-CM

## 2018-05-16 DIAGNOSIS — G4733 Obstructive sleep apnea (adult) (pediatric): Secondary | ICD-10-CM

## 2018-05-16 DIAGNOSIS — G309 Alzheimer's disease, unspecified: Secondary | ICD-10-CM | POA: Diagnosis not present

## 2018-05-16 DIAGNOSIS — R634 Abnormal weight loss: Secondary | ICD-10-CM

## 2018-05-16 DIAGNOSIS — N200 Calculus of kidney: Secondary | ICD-10-CM | POA: Diagnosis not present

## 2018-05-16 DIAGNOSIS — F0280 Dementia in other diseases classified elsewhere without behavioral disturbance: Secondary | ICD-10-CM

## 2018-05-16 DIAGNOSIS — E119 Type 2 diabetes mellitus without complications: Secondary | ICD-10-CM

## 2018-05-16 NOTE — Progress Notes (Signed)
Location:  Occupational psychologist of Service:  SNF (31) Provider:   Cindi Carbon, ANP [place] [phone]   Gayland Curry, DO  Patient Care Team: Gayland Curry, DO as PCP - General (Geriatric Medicine)  Extended Emergency Contact Information Primary Emergency Contact: Aicher,[name] Address: [address]          [place], [place] [address] [name] of [place] Phone: [phone] Mobile Phone: [phone] Relation: Spouse  Code Status:  DNR Goals of care: Advanced Directive information Advanced Directives 04/30/2018  Does Patient Have a Medical Advance Directive? Yes  Type of Paramedic of [place];Living will;Out of facility DNR (pink MOST or yellow form)  Does patient want to make changes to medical advance directive? No - Patient declined  Copy of [place] in Chart? Yes  Would patient like information on creating a medical advance directive? -  Pre-existing out of facility DNR order (yellow form or pink MOST form) Yellow form placed in chart (order not valid for inpatient use)     Chief Complaint  Patient presents with  . Medical Management of Chronic Issues    HPI:  Pt is a 77 y.o. male seen today for medical management of chronic diseases.    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 He can be redirected but it is difficult to do so per the staff. He has thoughts that his wife is leaving him or cheating on him. Zoloft was increased due to this with minimal benefit.  Weight loss: He has lost 10 lbs in the past month and staff report decreased intake Wt Readings from Last 3 Encounters:  05/16/18 258 lb 4.8 oz (117.2 kg)  04/30/18 269 lb (122 kg)  04/15/18 268 lb 8 oz (121.8 kg)   DMII:  Lab Results  Component Value Date   HGBA1C 7.8 11/21/2017    CBG range 141-165 Lots of candy at the bedside  Sleep apnea: wears cpap, no reports of somnolence or decreased 02 sats  Recurrent nephrolithiasis: no current pain or gross hematuria. Recent UA C and S showed no infection but 3+ blood Hx of elevated PSA and TURP followed by urology  Past Medical History:  Diagnosis Date  . Abnormality of gait 04/05/2015  . Benign prostatic hypertrophy   . Dementia   . Diabetes mellitus type II    no meds  . Elevated PSA   . Erectile dysfunction   . Gout   . Hereditary and idiopathic peripheral neuropathy 08/02/2016  . Hypertension   . Memory changes   . Memory difficulties 04/05/2015  . Nephrolithiasis    hx of  . Obstructive sleep apnea   . Partial Achilles tendon tear   . Rosacea, acne   . Tremor 04/05/2015   Past Surgical History:  Procedure Laterality Date  . CARPAL TUNNEL RELEASE  2004  . CARPAL TUNNEL RELEASE  10/31/2011   Procedure: CARPAL TUNNEL RELEASE;  Surgeon: Cammie Sickle., MD;  Location: [facility];  Service: Orthopedics;  Laterality: Left;  . EYE SURGERY     cataracts  . HAND SURGERY  2007   right hand  . INGUINAL HERNIA REPAIR    . JOINT REPLACEMENT     left total knee  . KNEE SURGERY  1961   left knee  .  TONSILLECTOMY    . TONSILLECTOMY AND ADENOIDECTOMY    . TRANSURETHRAL RESECTION OF PROSTATE      Allergies  Allergen Reactions  . Ciprofloxacin     REACTION: Diarrhea,l rectal irritation    Outpatient Encounter Medications as of 05/16/2018  Medication Sig  . allopurinol (ZYLOPRIM) 100 MG tablet Take 100 mg by mouth daily.   Marland Kitchen amLODipine (NORVASC) 10 MG tablet Take 10 mg by mouth daily.  Marland Kitchen aspirin 81 MG tablet Take 81 mg by mouth daily.  . Cholecalciferol (VITAMIN D-3 PO) Take 2,000 Units by mouth daily.   . diclofenac sodium (VOLTAREN) 1 % GEL Apply 2 g topically daily as needed.  . donepezil (ARICEPT) 10 MG tablet Take 10 mg by mouth at bedtime.  . finasteride (PROSCAR) 5 MG tablet Take 5  mg by mouth at bedtime.   Marland Kitchen lisinopril (PRINIVIL,ZESTRIL) 40 MG tablet Take 40 mg by mouth daily.   . memantine (NAMENDA) 10 MG tablet Take 1 tablet by mouth 2 (two) times daily.  . metFORMIN (GLUCOPHAGE) 500 MG tablet Take 500 mg by mouth daily with breakfast.  . pantoprazole (PROTONIX) 40 MG tablet Take 40 mg by mouth daily.   . sertraline (ZOLOFT) 100 MG tablet Take 100 mg by mouth at bedtime.   No facility-administered encounter medications on file as of 05/16/2018.     Review of Systems  Unable to perform ROS: Dementia    Immunization History  Administered Date(s) Administered  . Influenza Split 08/18/2011, 08/08/2012  . Influenza Whole 08/21/2006, 07/11/2007, 09/13/2009, 07/13/2010  . Influenza, Seasonal, Injecte, Preservative Fre 07/10/2013  . Influenza-Unspecified 08/08/2012, 07/17/2014, 08/10/2014, 06/16/2015, 07/30/2017  . Pneumococcal Conjugate-13 01/06/2014  . Pneumococcal Polysaccharide-23 03/12/2012, 03/10/2014  . Td 10/09/1992  . Tdap 03/12/2012, 01/08/2014  . Zoster 12/26/2007, 03/10/2014  . Zoster Recombinat (Shingrix) 01/23/2018   Pertinent  Health Maintenance Due  Topic Date Due  . INFLUENZA VACCINE  05/09/2018  . HEMOGLOBIN A1C  05/21/2018  . FOOT EXAM  01/08/2019  . OPHTHALMOLOGY EXAM  04/04/2019  . PNA vac Low Risk Adult  Completed  . COLONOSCOPY  Discontinued   Fall Risk  04/30/2018  Falls in the past year? No   Functional Status Survey:    Vitals:   05/16/18 1211  Weight: 258 lb 4.8 oz (117.2 kg)   Body mass index is 32.29 kg/m. Physical Exam  Constitutional: No distress.  HENT:  Head: Normocephalic and atraumatic.  Nose: Nose normal.  Mouth/Throat: Oropharynx is clear and moist. No oropharyngeal exudate.  Neck: No JVD present. No tracheal deviation present. No thyromegaly present.  Cardiovascular: Normal rate and regular rhythm.  No murmur heard. Pulmonary/Chest: Effort normal and breath sounds normal. No respiratory distress. He has no  wheezes.  Abdominal: Soft. Bowel sounds are normal. He exhibits no distension. There is no tenderness.  Lymphadenopathy:    He has no cervical adenopathy.  Neurological: He is alert. No cranial nerve deficit.  Oriented to self only. Able to f/c  Skin: Skin is warm and dry. He is not diaphoretic.  Psychiatric: He has a normal mood and affect.    Labs reviewed: Recent Labs    11/12/17 1718 11/21/17  NA 138 143  K 4.0 4.7  CL 105  --   CO2 25  --   GLUCOSE 182*  --   BUN 18 22*  CREATININE 1.47* 1.4*  CALCIUM 8.8*  --    Recent Labs    11/12/17 1718  AST 24  ALT 20  ALKPHOS  72  BILITOT 0.5  PROT 6.5  ALBUMIN 3.8   Recent Labs    11/12/17 1718  WBC 7.8  NEUTROABS 4.7  HGB 11.3*  HCT 34.8*  MCV 65.3*  PLT 172   Lab Results  Component Value Date   TSH 0.51 10/01/2012   Lab Results  Component Value Date   HGBA1C 7.8 11/21/2017   Lab Results  Component Value Date   CHOL 151 11/11/2013   HDL 33.00 (L) 11/11/2013   LDLCALC 96 11/11/2013   LDLDIRECT 97.8 05/26/2009   TRIG 112.0 11/11/2013   CHOLHDL 5 11/11/2013    Significant Diagnostic Results in last 30 days:  No results found.  Assessment/Plan  Mixed Alzheimer's and vascular dementia Seems to show signs of decline with weight loss and agitation in the evening. Continue non pharm measures to treat evening agitation and supportive care in the skilled environment.   Severe obstructive sleep apnea Continue cpap  Diabetes mellitus type 2 without retinopathy (HCC) Goal A1C <8%, check A1C  Chronic kidney disease, stage III (moderate) (HCC) Continue to periodically monitor BMP and avoid nephrotoxic agents. Currently on metformin and ace.    Depression Difficult to assess given his decreased verbalization. He is not tearful or expressing these symptoms during my visit but does have agitation and weight loss. Would continue zoloft and reassess next month.   Recurrent nephrolithiasis He has a hx of this  as well as hematuria. This is not currently a problem. His recent urine showed no infection but microscopic blood 3+.  Follow up due with urology in Jan of 2020 per notes.  Weight loss: Wdue to decreased intake,  Check TSH  Family/ staff Communication: staff/resident/wife  Labs/tests ordered:  TSH BMP A1C

## 2018-05-16 NOTE — Assessment & Plan Note (Signed)
Continue cpap.  

## 2018-05-16 NOTE — Assessment & Plan Note (Signed)
Difficult to assess given his decreased verbalization. He is not tearful or expressing these symptoms during my visit but does have agitation and weight loss. Would continue zoloft and reassess next month.

## 2018-05-16 NOTE — Assessment & Plan Note (Signed)
He has a hx of this as well as hematuria. This is not currently a problem. His recent urine showed no infection but microscopic blood 3+.  Follow up due with urology in Jan of 2020 per notes.

## 2018-05-16 NOTE — Assessment & Plan Note (Signed)
Goal A1C <8%, check A1C

## 2018-05-16 NOTE — Assessment & Plan Note (Addendum)
Continue to periodically monitor BMP and avoid nephrotoxic agents. Currently on metformin and ace.

## 2018-05-16 NOTE — Assessment & Plan Note (Signed)
Seems to show signs of decline with weight loss and agitation in the evening. Continue non pharm measures to treat evening agitation and supportive care in the skilled environment.

## 2018-05-17 DIAGNOSIS — E039 Hypothyroidism, unspecified: Secondary | ICD-10-CM | POA: Diagnosis not present

## 2018-05-17 DIAGNOSIS — D649 Anemia, unspecified: Secondary | ICD-10-CM | POA: Diagnosis not present

## 2018-05-17 DIAGNOSIS — E119 Type 2 diabetes mellitus without complications: Secondary | ICD-10-CM | POA: Diagnosis not present

## 2018-05-17 DIAGNOSIS — Z79899 Other long term (current) drug therapy: Secondary | ICD-10-CM | POA: Diagnosis not present

## 2018-05-17 DIAGNOSIS — N390 Urinary tract infection, site not specified: Secondary | ICD-10-CM | POA: Diagnosis not present

## 2018-05-17 LAB — BASIC METABOLIC PANEL
BUN: 25 — AB (ref 4–21)
Creatinine: 1.3 (ref 0.6–1.3)
Glucose: 209
Potassium: 4.3 (ref 3.4–5.3)
Sodium: 140 (ref 137–147)

## 2018-05-17 LAB — TSH: TSH: 1.14 (ref 0.41–5.90)

## 2018-05-17 LAB — HEMOGLOBIN A1C: Hemoglobin A1C: 6.8

## 2018-07-02 ENCOUNTER — Non-Acute Institutional Stay (SKILLED_NURSING_FACILITY): Payer: Medicare Other | Admitting: Internal Medicine

## 2018-07-02 ENCOUNTER — Encounter: Payer: Self-pay | Admitting: Internal Medicine

## 2018-07-02 DIAGNOSIS — F0280 Dementia in other diseases classified elsewhere without behavioral disturbance: Secondary | ICD-10-CM

## 2018-07-02 DIAGNOSIS — M170 Bilateral primary osteoarthritis of knee: Secondary | ICD-10-CM | POA: Diagnosis not present

## 2018-07-02 DIAGNOSIS — N183 Chronic kidney disease, stage 3 unspecified: Secondary | ICD-10-CM

## 2018-07-02 DIAGNOSIS — F329 Major depressive disorder, single episode, unspecified: Secondary | ICD-10-CM

## 2018-07-02 DIAGNOSIS — F0150 Vascular dementia without behavioral disturbance: Secondary | ICD-10-CM | POA: Diagnosis not present

## 2018-07-02 DIAGNOSIS — G309 Alzheimer's disease, unspecified: Secondary | ICD-10-CM

## 2018-07-02 DIAGNOSIS — I129 Hypertensive chronic kidney disease with stage 1 through stage 4 chronic kidney disease, or unspecified chronic kidney disease: Secondary | ICD-10-CM | POA: Diagnosis not present

## 2018-07-02 DIAGNOSIS — E119 Type 2 diabetes mellitus without complications: Secondary | ICD-10-CM

## 2018-07-02 NOTE — Progress Notes (Signed)
Patient ID: [name], male   DOB: 06-Jan-1941, 77 y.o.   MRN: [id number]  Location:  [facility] Room Number: 120  Place of Service:  SNF [phone]) Provider:   Gayland Curry, DO  Patient Care Team: Gayland Curry, DO as PCP - General (Geriatric Medicine)  Extended Emergency Contact Information Primary Emergency Contact: Bloor,[name] Address: [address]          [place], [place] [address] [name] of [place] Phone: [phone] Mobile Phone: [phone] Relation: Spouse  Code Status:  DNR Goals of care: Advanced Directive information Advanced Directives 07/02/2018  Does Patient Have a Medical Advance Directive? Yes  Type of Paramedic of [place];Living will;Out of facility DNR (pink MOST or yellow form)  Does patient want to make changes to medical advance directive? No - Patient declined  Copy of [place] in Chart? Yes  Would patient like information on creating a medical advance directive? -  Pre-existing out of facility DNR order (yellow form or pink MOST form) Yellow form placed in chart (order not valid for inpatient use)     Chief Complaint  Patient presents with  . Medical Management of Chronic Issues    Routine Visit    HPI:  Pt is a 77 y.o. male seen today for medical management of chronic diseases.    When seen, pt reported still feeling sad and down with being unable to watch his sports live and struggling to get the right channel on the tv. [name] feels lonely and bored.  However, as I talked with him and his caregiver shared more information, [name] did cheer up considerably.  When asked if [name] wanted to try another medication that might help his spirits, [name] did agree.  [name] already takes zoloft 100mg .  [name] has pain in bilateral knees which is improved with voltaren gel, but not entirely resolved.    [name] still eats well.  His weight has actually trended up 4.7 lbs since his last  visit early august.    [name] does sleep well.  Bowels are moving without medications.  [name]'s had no gout flares.    BP today satisfactory with lisinopril and norvasc.  [name] remains on aricept for his dementia along with namenda.  [name] has caregivers part time in SNF.    For his DMII, [name]'s on metformin.   Lab Results  Component Value Date   HGBA1C 6.8 05/17/2018   Past Medical History:  Diagnosis Date  . Abnormality of gait 04/05/2015  . Benign prostatic hypertrophy   . Dementia   . Diabetes mellitus type II    no meds  . Elevated PSA   . Erectile dysfunction   . Gout   . Hereditary and idiopathic peripheral neuropathy 08/02/2016  . Hypertension   . Memory changes   . Memory difficulties 04/05/2015  . Nephrolithiasis    hx of  . Obstructive sleep apnea   . Partial Achilles tendon tear   . Rosacea, acne   . Tremor 04/05/2015   Past Surgical History:  Procedure Laterality Date  . CARPAL TUNNEL RELEASE  2004  . CARPAL TUNNEL RELEASE  10/31/2011   Procedure: CARPAL TUNNEL RELEASE;  Surgeon: Cammie Sickle., MD;  Location: [facility];  Service: Orthopedics;  Laterality: Left;  . EYE SURGERY     cataracts  . HAND SURGERY  2007   right hand  . INGUINAL HERNIA REPAIR    .  JOINT REPLACEMENT     left total knee  . KNEE SURGERY  1961   left knee  . TONSILLECTOMY    . TONSILLECTOMY AND ADENOIDECTOMY    . TRANSURETHRAL RESECTION OF PROSTATE      Allergies  Allergen Reactions  . Ciprofloxacin     REACTION: Diarrhea,l rectal irritation    Outpatient Encounter Medications as of 07/02/2018  Medication Sig  . allopurinol (ZYLOPRIM) 100 MG tablet Take 100 mg by mouth daily.   Marland Kitchen amLODipine (NORVASC) 10 MG tablet Take 10 mg by mouth daily.  Marland Kitchen aspirin 81 MG tablet Take 81 mg by mouth daily.  . Cholecalciferol (VITAMIN D-3 PO) Take 2,000 Units by mouth daily.   . diclofenac sodium (VOLTAREN) 1 % GEL Apply 2 g topically daily as needed.  . donepezil (ARICEPT) 10 MG  tablet Take 10 mg by mouth at bedtime.  . finasteride (PROSCAR) 5 MG tablet Take 5 mg by mouth at bedtime.   . hydrocortisone 2.5 % cream Apply 1 application topically as needed.  Marland Kitchen lisinopril (PRINIVIL,ZESTRIL) 40 MG tablet Take 40 mg by mouth daily.   . memantine (NAMENDA) 10 MG tablet Take 1 tablet by mouth 2 (two) times daily.  . metFORMIN (GLUCOPHAGE) 500 MG tablet Take 500 mg by mouth daily with breakfast.  . pantoprazole (PROTONIX) 40 MG tablet Take 40 mg by mouth daily.   . sertraline (ZOLOFT) 100 MG tablet Take 100 mg by mouth at bedtime.   No facility-administered encounter medications on file as of 07/02/2018.     Review of Systems  Constitutional: Positive for fatigue. Negative for activity change, appetite change, chills and fever.  HENT: Negative for congestion.   Eyes: Negative for visual disturbance.  Respiratory: Negative for chest tightness and shortness of breath.   Cardiovascular: Negative for chest pain, palpitations and leg swelling.  Gastrointestinal: Negative for constipation.  Genitourinary: Negative for dysuria.  Musculoskeletal: Positive for arthralgias and gait problem.  Neurological: Positive for weakness. Negative for dizziness.  Psychiatric/Behavioral: Positive for confusion.    Immunization History  Administered Date(s) Administered  . Influenza Split 08/18/2011, 08/08/2012  . Influenza Whole 08/21/2006, 07/11/2007, 09/13/2009, 07/13/2010  . Influenza, Seasonal, Injecte, Preservative Fre 07/10/2013  . Influenza-Unspecified 08/08/2012, 07/17/2014, 08/10/2014, 06/16/2015, 07/30/2017  . Pneumococcal Conjugate-13 01/06/2014  . Pneumococcal Polysaccharide-23 03/12/2012, 03/10/2014  . Td 10/09/1992  . Tdap 03/12/2012, 01/08/2014  . Zoster 12/26/2007, 03/10/2014  . Zoster Recombinat (Shingrix) 01/23/2018   Pertinent  Health Maintenance Due  Topic Date Due  . INFLUENZA VACCINE  05/09/2018  . HEMOGLOBIN A1C  05/21/2018  . FOOT EXAM  01/08/2019  .  OPHTHALMOLOGY EXAM  04/04/2019  . PNA vac Low Risk Adult  Completed  . COLONOSCOPY  Discontinued   Fall Risk  04/30/2018  Falls in the past year? No   Functional Status Survey:  dependent except with feeding self  Vitals:   07/02/18 1408  BP: 138/74  Pulse: 61  Resp: 17  Temp: 99 F (37.2 C)  TempSrc: Oral  SpO2: 98%  Weight: 263 lb (119.3 kg)  Height: 6\' 3"  (1.905 m)   Body mass index is 32.87 kg/m. Physical Exam  Constitutional: [name] appears well-developed and well-nourished. No distress.  HENT:  Head: Normocephalic and atraumatic.  Cardiovascular: Normal rate, regular rhythm, normal heart sounds and intact distal pulses.  Pulmonary/Chest: Effort normal and breath sounds normal. No respiratory distress.  Abdominal: Bowel sounds are normal. [name] exhibits no distension. There is no tenderness.  Musculoskeletal: Normal range of motion.  [name] exhibits no edema or tenderness.  Uses high backed manual wheelchair  Neurological: [name] is alert.  Skin: Skin is warm and dry. Capillary refill takes less than 2 seconds.  Psychiatric:  Flat affect until [name] got more into conversation--caregiver was quite helpful getting him started    Labs reviewed: Recent Labs    11/12/17 1718 11/21/17 11/21/17 0730 05/17/18 0900  NA 138 143 143 140  K 4.0 4.7 4.7 4.3  CL 105  --   --   --   CO2 25  --   --   --   GLUCOSE 182*  --   --   --   BUN 18 22* 22* 25*  CREATININE 1.47* 1.4* 1.4* 1.3  CALCIUM 8.8*  --   --   --    Recent Labs    11/12/17 1718  AST 24  ALT 20  ALKPHOS 72  BILITOT 0.5  PROT 6.5  ALBUMIN 3.8   Recent Labs    11/12/17 1718  WBC 7.8  NEUTROABS 4.7  HGB 11.3*  HCT 34.8*  MCV 65.3*  PLT 172   Lab Results  Component Value Date   TSH 1.14 05/17/2018   Lab Results  Component Value Date   HGBA1C 6.8 05/17/2018   Lab Results  Component Value Date   CHOL 151 11/11/2013   HDL 33.00 (L) 11/11/2013   LDLCALC 96 11/11/2013   LDLDIRECT 97.8 05/26/2009   TRIG  112.0 11/11/2013   CHOLHDL 5 11/11/2013    Assessment/Plan 1. Mixed Alzheimer's and vascular dementia ([place]) -cont aricept, namenda and increased caregiver support--[name] seems to do really well with this caregiver  2. Reactive depression -cont zoloft, recommended trying to add wellbutrin 150mg  to energize him and help his mood--wrote for nursing to inquire with his wife first -pt actually seemed to be doing better when I saw him one on one than [name] did out in the group when I'd been observing him and the caregiver seemed to be improving his spirits  3. Diabetes mellitus type 2 without retinopathy ([place]) -has been well controlled, cont same regimen and monitor  4. Benign hypertension with chronic kidney disease, stage III (HCC) -Avoid nephrotoxic agents like nsaids, dose adjust renally excreted meds, hydrate. -bp good today, monitor and we should be made aware if staying over 150/90 consistently so adjustments can be made  5. Primary osteoarthritis of both knees -cont voltaren gel, tylenol  Family/ staff Communication: discussed with snf 3 nurse and pt's CNA/caregiver  Labs/tests ordered:  No new  Coburn Knaus L. Faisal Stradling, D.O. [facility] [address], [place] [address] Cell Phone (Mon-Fri 8am-5pm):  [phone] On Call:  [phone] & follow prompts after 5pm & weekends Office Phone:  [phone] Office Fax:  [phone]

## 2018-07-29 ENCOUNTER — Encounter: Payer: Self-pay | Admitting: Adult Health

## 2018-07-29 NOTE — Progress Notes (Signed)
Erroneous

## 2018-07-31 ENCOUNTER — Non-Acute Institutional Stay (SKILLED_NURSING_FACILITY): Payer: Medicare Other | Admitting: Adult Health

## 2018-07-31 ENCOUNTER — Encounter: Payer: Self-pay | Admitting: Adult Health

## 2018-07-31 DIAGNOSIS — E119 Type 2 diabetes mellitus without complications: Secondary | ICD-10-CM | POA: Diagnosis not present

## 2018-07-31 DIAGNOSIS — F0280 Dementia in other diseases classified elsewhere without behavioral disturbance: Secondary | ICD-10-CM | POA: Diagnosis not present

## 2018-07-31 DIAGNOSIS — F0150 Vascular dementia without behavioral disturbance: Secondary | ICD-10-CM | POA: Diagnosis not present

## 2018-07-31 DIAGNOSIS — G309 Alzheimer's disease, unspecified: Secondary | ICD-10-CM

## 2018-07-31 DIAGNOSIS — M48061 Spinal stenosis, lumbar region without neurogenic claudication: Secondary | ICD-10-CM | POA: Diagnosis not present

## 2018-07-31 DIAGNOSIS — N183 Chronic kidney disease, stage 3 unspecified: Secondary | ICD-10-CM

## 2018-07-31 DIAGNOSIS — F329 Major depressive disorder, single episode, unspecified: Secondary | ICD-10-CM | POA: Diagnosis not present

## 2018-07-31 DIAGNOSIS — I129 Hypertensive chronic kidney disease with stage 1 through stage 4 chronic kidney disease, or unspecified chronic kidney disease: Secondary | ICD-10-CM | POA: Diagnosis not present

## 2018-07-31 NOTE — Progress Notes (Signed)
Location:  [facility] of Service:  SNF (31) Provider:   Cindi Ortiz, ANP [name] [phone]   Ryan Curry, DO  Patient Care Team: Ryan Curry, DO as PCP - General (Geriatric Medicine)  Extended Emergency Contact Information Primary Emergency Contact: [name],[name] Address: [address]          [place], [place] [address] [name] of [place] Phone: [phone] Mobile Phone: [phone] Relation: Spouse  Code Status:  DNR Goals of care: Advanced Directive information Advanced Directives 07/02/2018  Does Patient Have a Medical Advance Directive? Yes  Type of Paramedic of [place];Living will;Out of facility DNR (pink MOST or yellow form)  Does patient want to make changes to medical advance directive? No - Patient declined  Copy of [place] in Chart? Yes  Would patient like information on creating a medical advance directive? -  Pre-existing out of facility DNR order (yellow form or pink MOST form) Yellow form placed in chart (order not valid for inpatient use)     Chief Complaint  Patient presents with  . Medical Management of Chronic Issues    HPI:  Pt is a 77 y.o. male seen today for medical management of chronic diseases.    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 He is no longer tested for MMSE scoring as he is minimally verbal. He requires assistance for all ADls and a lift for transfers.   Depression: he was started on wellbutrin last month for continued depression despite zoloft. Staff report they have not noticed a difference yet and feel his affect is more flat. The express concern that he may miss his wife as she no longer  lives on campus.   He has a long history of joint related issues such as spinal stenosis, cervicalgia, OA, etc but has not had any pain per staff  Staff also report a small furuncle to the right thigh Past Medical History:  Diagnosis Date  . Abnormality of gait 04/05/2015  . Benign prostatic hypertrophy   . Dementia ([place])   . Diabetes mellitus type II    no meds  . Elevated PSA   . Erectile dysfunction   . Gout   . Hereditary and idiopathic peripheral neuropathy 08/02/2016  . Hypertension   . Memory changes   . Memory difficulties 04/05/2015  . Nephrolithiasis    hx of  . Obstructive sleep apnea   . Partial Achilles tendon tear   . Rosacea, acne   . Tremor 04/05/2015   Past Surgical History:  Procedure Laterality Date  . CARPAL TUNNEL RELEASE  2004  . CARPAL TUNNEL RELEASE  10/31/2011   Procedure: CARPAL TUNNEL RELEASE;  Surgeon: [name]., MD;  Location: [facility];  Service: Orthopedics;  Laterality: Left;  . EYE SURGERY     cataracts  . HAND SURGERY  2007   right hand  . INGUINAL HERNIA REPAIR    . JOINT REPLACEMENT     left total knee  . KNEE SURGERY  1961   left knee  . TONSILLECTOMY    . TONSILLECTOMY AND ADENOIDECTOMY    . TRANSURETHRAL RESECTION OF PROSTATE  Allergies  Allergen Reactions  . Ciprofloxacin     REACTION: Diarrhea,l rectal irritation    Outpatient Encounter Medications as of 07/31/2018  Medication Sig  . allopurinol (ZYLOPRIM) 100 MG tablet Take 100 mg by mouth daily.   Marland Kitchen amLODipine (NORVASC) 10 MG tablet Take 10 mg by mouth daily.  Marland Kitchen aspirin 81 MG tablet Take 81 mg by mouth daily.  Marland Kitchen buPROPion (WELLBUTRIN XL) 150 MG 24 hr tablet Take 150 mg by mouth daily.  . Cholecalciferol (VITAMIN D-3 PO) Take 2,000 Units by mouth daily.   . diclofenac sodium (VOLTAREN) 1 % GEL Apply 2 g topically daily as needed.  . donepezil (ARICEPT) 10 MG tablet Take 10 mg by mouth at bedtime.  . finasteride (PROSCAR) 5 MG tablet Take 5  mg by mouth at bedtime.   . hydrocortisone 2.5 % cream Apply 1 application topically as needed.  Marland Kitchen lisinopril (PRINIVIL,ZESTRIL) 40 MG tablet Take 40 mg by mouth daily.   . memantine (NAMENDA) 10 MG tablet Take 1 tablet by mouth 2 (two) times daily.  . metFORMIN (GLUCOPHAGE) 500 MG tablet Take 500 mg by mouth daily with breakfast.  . pantoprazole (PROTONIX) 40 MG tablet Take 40 mg by mouth daily.   . sertraline (ZOLOFT) 100 MG tablet Take 100 mg by mouth at bedtime.   No facility-administered encounter medications on file as of 07/31/2018.     Review of Systems  Unable to perform ROS: Dementia    Immunization History  Administered Date(s) Administered  . Influenza Split 08/18/2011, 08/08/2012  . Influenza Whole 08/21/2006, 07/11/2007, 09/13/2009, 07/13/2010  . Influenza, Seasonal, Injecte, Preservative Fre 07/10/2013  . Influenza-Unspecified 08/08/2012, 07/17/2014, 08/10/2014, 06/16/2015, 07/30/2017  . Pneumococcal Conjugate-13 01/06/2014  . Pneumococcal Polysaccharide-23 03/12/2012, 03/10/2014  . Td 10/09/1992  . Tdap 03/12/2012, 01/08/2014  . Zoster 12/26/2007, 03/10/2014  . Zoster Recombinat (Shingrix) 01/23/2018   Pertinent  Health Maintenance Due  Topic Date Due  . INFLUENZA VACCINE  05/09/2018  . HEMOGLOBIN A1C  11/17/2018  . FOOT EXAM  01/08/2019  . OPHTHALMOLOGY EXAM  04/04/2019  . PNA vac Low Risk Adult  Completed  . COLONOSCOPY  Discontinued   Fall Risk  04/30/2018  Falls in the past year? No   Functional Status Survey:    Vitals:   07/31/18 1640  Weight: 263 lb (119.3 kg)   Body mass index is 32.87 kg/m. Physical Exam  Constitutional: No distress.  HENT:  Head: Normocephalic and atraumatic.  Mouth/Throat: No oropharyngeal exudate.  Neck: No JVD present. No tracheal deviation present. No thyromegaly present.  Cardiovascular: Normal rate and regular rhythm.  No murmur heard. Pulmonary/Chest: Effort normal and breath sounds normal. No respiratory  distress. He has no wheezes.  Abdominal: Soft. Bowel sounds are normal. He exhibits no distension. There is no tenderness.  Lymphadenopathy:    He has no cervical adenopathy.  Neurological: He is alert. No cranial nerve deficit.  Oriented x1, MAE, intermittently able to f/c  Skin: Skin is warm and dry. He is not diaphoretic.  Right upper thigh with small furuncle noted  Psychiatric: He has a normal mood and affect.  Nursing note and vitals reviewed.   Labs reviewed: Recent Labs    11/12/17 1718 11/21/17 11/21/17 0730 05/17/18 0900  NA 138 143 143 140  K 4.0 4.7 4.7 4.3  CL 105  --   --   --   CO2 25  --   --   --   GLUCOSE 182*  --   --   --  BUN 18 22* 22* 25*  CREATININE 1.47* 1.4* 1.4* 1.3  CALCIUM 8.8*  --   --   --    Recent Labs    11/12/17 1718  AST 24  ALT 20  ALKPHOS 72  BILITOT 0.5  PROT 6.5  ALBUMIN 3.8   Recent Labs    11/12/17 1718  WBC 7.8  NEUTROABS 4.7  HGB 11.3*  HCT 34.8*  MCV 65.3*  PLT 172   Lab Results  Component Value Date   TSH 1.14 05/17/2018   Lab Results  Component Value Date   HGBA1C 6.8 05/17/2018   Lab Results  Component Value Date   CHOL 151 11/11/2013   HDL 33.00 (L) 11/11/2013   LDLCALC 96 11/11/2013   LDLDIRECT 97.8 05/26/2009   TRIG 112.0 11/11/2013   CHOLHDL 5 11/11/2013    Significant Diagnostic Results in last 30 days:  No results found.  Assessment/Plan  1. Mixed Alzheimer's and vascular dementia ([place]) Continue supportive care in the skilled environment Continue Aricept   2. Benign hypertension with chronic kidney disease, stage III (HCC) Controlled, Norvasc 10 mg qd and Zestril 40 mg qd  3. Chronic kidney disease, stage III (moderate) (HCC) Continue to periodically monitor BMP and avoid nephrotoxic agents  4. Diabetes mellitus type 2 without retinopathy (HCC) Continue metformin 500 mg qd and monitor A1C  5. Spinal stenosis of lumbar region at multiple levels No reports of apin  6.  Depression Continue Zoloft and Wellbutrin and re evaluate next month   Family/ staff Communication: staff/resident  Labs/tests ordered:  NA

## 2018-08-08 DIAGNOSIS — Z23 Encounter for immunization: Secondary | ICD-10-CM | POA: Diagnosis not present

## 2018-08-29 ENCOUNTER — Non-Acute Institutional Stay (SKILLED_NURSING_FACILITY): Payer: Medicare Other | Admitting: Adult Health

## 2018-08-29 ENCOUNTER — Encounter: Payer: Self-pay | Admitting: Adult Health

## 2018-08-29 DIAGNOSIS — E119 Type 2 diabetes mellitus without complications: Secondary | ICD-10-CM | POA: Diagnosis not present

## 2018-08-29 DIAGNOSIS — IMO0002 Reserved for concepts with insufficient information to code with codable children: Secondary | ICD-10-CM

## 2018-08-29 DIAGNOSIS — I129 Hypertensive chronic kidney disease with stage 1 through stage 4 chronic kidney disease, or unspecified chronic kidney disease: Secondary | ICD-10-CM

## 2018-08-29 DIAGNOSIS — M1710 Unilateral primary osteoarthritis, unspecified knee: Secondary | ICD-10-CM

## 2018-08-29 DIAGNOSIS — N401 Enlarged prostate with lower urinary tract symptoms: Secondary | ICD-10-CM

## 2018-08-29 DIAGNOSIS — F0150 Vascular dementia without behavioral disturbance: Secondary | ICD-10-CM

## 2018-08-29 DIAGNOSIS — G309 Alzheimer's disease, unspecified: Secondary | ICD-10-CM

## 2018-08-29 DIAGNOSIS — F0280 Dementia in other diseases classified elsewhere without behavioral disturbance: Secondary | ICD-10-CM | POA: Diagnosis not present

## 2018-08-29 DIAGNOSIS — N183 Chronic kidney disease, stage 3 unspecified: Secondary | ICD-10-CM

## 2018-08-29 DIAGNOSIS — F329 Major depressive disorder, single episode, unspecified: Secondary | ICD-10-CM | POA: Diagnosis not present

## 2018-08-29 NOTE — Progress Notes (Signed)
Location:  Occupational psychologist of Service:  SNF (31) Provider:   Cindi Carbon, ANP [place] [phone]   Gayland Curry, DO  Patient Care Team: Gayland Curry, DO as PCP - General (Geriatric Medicine)  Extended Emergency Contact Information Primary Emergency Contact: Burdell,[name] Address: [address]          [place], [place] [address] [name] of [place] Phone: [phone] Mobile Phone: [phone] Relation: Spouse  Code Status:  DNR Goals of care: Advanced Directive information Advanced Directives 07/02/2018  Does Patient Have a Medical Advance Directive? Yes  Type of Paramedic of [place];Living will;Out of facility DNR (pink MOST or yellow form)  Does patient want to make changes to medical advance directive? No - Patient declined  Copy of [place] in Chart? Yes  Would patient like information on creating a medical advance directive? -  Pre-existing out of facility DNR order (yellow form or pink MOST form) Yellow form placed in chart (order not valid for inpatient use)     Chief Complaint  Patient presents with  . Medical Management of Chronic Issues    HPI:  Pt is a 77 y.o. male seen today for medical management of chronic diseases.    CBGS in the am 140-150's with one outlier in the past week of 200 Lab Results  Component Value Date   HGBA1C 6.8 05/17/2018   HTN: SBP range 130-140  Depression: started on wellbutrin in addition to zoloft and seems to be more talkative and smiles  AD: he has a hx of playing football which likely contributed to his symptoms per previous notes. Also was +APO4  Left knee OA with hx of knee surgery: no s/s of pain  BPH s/p TURP: on proscar with no symptoms  Past Medical History:  Diagnosis Date  . Abnormality of gait 04/05/2015  . Benign prostatic hypertrophy   . Dementia ([place])   . Diabetes mellitus type II    no meds  .  Elevated PSA   . Erectile dysfunction   . Gout   . Hereditary and idiopathic peripheral neuropathy 08/02/2016  . Hypertension   . Memory changes   . Memory difficulties 04/05/2015  . Nephrolithiasis    hx of  . Obstructive sleep apnea   . Partial Achilles tendon tear   . Rosacea, acne   . Tremor 04/05/2015   Past Surgical History:  Procedure Laterality Date  . CARPAL TUNNEL RELEASE  2004  . CARPAL TUNNEL RELEASE  10/31/2011   Procedure: CARPAL TUNNEL RELEASE;  Surgeon: Cammie Sickle., MD;  Location: [facility];  Service: Orthopedics;  Laterality: Left;  . EYE SURGERY     cataracts  . HAND SURGERY  2007   right hand  . INGUINAL HERNIA REPAIR    . JOINT REPLACEMENT     left total knee  . KNEE SURGERY  1961   left knee  . TONSILLECTOMY    . TONSILLECTOMY AND ADENOIDECTOMY    . TRANSURETHRAL RESECTION OF PROSTATE      Allergies  Allergen Reactions  . Ciprofloxacin     REACTION: Diarrhea,l rectal irritation    Outpatient Encounter Medications as of 08/29/2018  Medication Sig  . allopurinol (ZYLOPRIM) 100 MG tablet Take 100 mg by mouth daily.   Marland Kitchen amLODipine (NORVASC) 10 MG tablet Take 10 mg by mouth daily.  Marland Kitchen aspirin 81 MG tablet Take 81 mg by mouth daily.  Marland Kitchen  buPROPion (WELLBUTRIN XL) 150 MG 24 hr tablet Take 150 mg by mouth daily.  . Cholecalciferol (VITAMIN D-3 PO) Take 2,000 Units by mouth daily.   . diclofenac sodium (VOLTAREN) 1 % GEL Apply 2 g topically daily as needed.  . donepezil (ARICEPT) 10 MG tablet Take 10 mg by mouth at bedtime.  . finasteride (PROSCAR) 5 MG tablet Take 5 mg by mouth at bedtime.   . hydrocortisone 2.5 % cream Apply 1 application topically as needed.  Marland Kitchen lisinopril (PRINIVIL,ZESTRIL) 40 MG tablet Take 40 mg by mouth daily.   . memantine (NAMENDA) 10 MG tablet Take 1 tablet by mouth 2 (two) times daily.  . metFORMIN (GLUCOPHAGE) 500 MG tablet Take 500 mg by mouth daily with breakfast.  . pantoprazole (PROTONIX) 40 MG tablet  Take 40 mg by mouth daily.   . sertraline (ZOLOFT) 100 MG tablet Take 100 mg by mouth at bedtime.   No facility-administered encounter medications on file as of 08/29/2018.     Review of Systems  Unable to perform ROS: Dementia    Immunization History  Administered Date(s) Administered  . Influenza Split 08/18/2011, 08/08/2012  . Influenza Whole 08/21/2006, 07/11/2007, 09/13/2009, 07/13/2010  . Influenza, Seasonal, Injecte, Preservative Fre 07/10/2013  . Influenza,inj,Quad PF,6+ Mos 07/30/2018  . Influenza-Unspecified 08/08/2012, 07/17/2014, 08/10/2014, 06/16/2015, 07/30/2017  . Pneumococcal Conjugate-13 01/06/2014  . Pneumococcal Polysaccharide-23 03/12/2012, 03/10/2014  . Td 10/09/1992  . Tdap 03/12/2012, 01/08/2014  . Zoster 12/26/2007, 03/10/2014  . Zoster Recombinat (Shingrix) 01/23/2018   Pertinent  Health Maintenance Due  Topic Date Due  . HEMOGLOBIN A1C  11/17/2018  . FOOT EXAM  01/08/2019  . OPHTHALMOLOGY EXAM  04/04/2019  . INFLUENZA VACCINE  Completed  . PNA vac Low Risk Adult  Completed  . COLONOSCOPY  Discontinued   Fall Risk  04/30/2018  Falls in the past year? No   Functional Status Survey:    Vitals:   08/29/18 1445  Weight: 264 lb 11.2 oz (120.1 kg)   Body mass index is 33.09 kg/m.  Wt Readings from Last 3 Encounters:  08/29/18 264 lb 11.2 oz (120.1 kg)  07/31/18 263 lb (119.3 kg)  07/02/18 263 lb (119.3 kg)   Physical Exam  Constitutional: No distress.  HENT:  Head: Normocephalic and atraumatic.  Mouth/Throat: No oropharyngeal exudate.  Neck: No JVD present. No tracheal deviation present. No thyromegaly present.  Cardiovascular: Normal rate and regular rhythm.  No murmur heard. Pulmonary/Chest: Effort normal and breath sounds normal. No respiratory distress. He has no wheezes.  Abdominal: Soft. Bowel sounds are normal. He exhibits no distension. There is no tenderness.  Musculoskeletal: He exhibits no edema, tenderness or deformity.    Lymphadenopathy:    He has no cervical adenopathy.  Neurological: He is alert.  Minimally verbal, smiles and able to intermittently follow commands.   Skin: Skin is warm and dry. He is not diaphoretic.  Psychiatric: He has a normal mood and affect.  Nursing note and vitals reviewed.   Labs reviewed: Recent Labs    11/12/17 1718 11/21/17 11/21/17 0730 05/17/18 0900  NA 138 143 143 140  K 4.0 4.7 4.7 4.3  CL 105  --   --   --   CO2 25  --   --   --   GLUCOSE 182*  --   --   --   BUN 18 22* 22* 25*  CREATININE 1.47* 1.4* 1.4* 1.3  CALCIUM 8.8*  --   --   --  Recent Labs    11/12/17 1718  AST 24  ALT 20  ALKPHOS 72  BILITOT 0.5  PROT 6.5  ALBUMIN 3.8   Recent Labs    11/12/17 1718  WBC 7.8  NEUTROABS 4.7  HGB 11.3*  HCT 34.8*  MCV 65.3*  PLT 172   Lab Results  Component Value Date   TSH 1.14 05/17/2018   Lab Results  Component Value Date   HGBA1C 6.8 05/17/2018   Lab Results  Component Value Date   CHOL 151 11/11/2013   HDL 33.00 (L) 11/11/2013   LDLCALC 96 11/11/2013   LDLDIRECT 97.8 05/26/2009   TRIG 112.0 11/11/2013   CHOLHDL 5 11/11/2013    Significant Diagnostic Results in last 30 days:  No results found.  Assessment/Plan  1. Mixed Alzheimer's and vascular dementia ([place]) Severe with assistance needed in all ADLs Continue Aricept and Namenda   2. Diabetes mellitus type 2 without retinopathy ([place]) Controlled, continue metformin  A1C q 6 mo  3. Benign non-nodular prostatic hyperplasia with lower urinary tract symptoms No symptoms Continue proscar  4. Reactive depression Improved  Continue Wellbutrin and Zoloft  5. Benign hypertension with chronic kidney disease, stage III ([facility]) Controlled Continue Norvasc 10 mg qd and lisinopril 40 mg qd  6. Osteoarthrosis involving lower leg No current issues  7. CKD III Continue to periodically monitor BMP and avoid nephrotoxic agents     Family/ staff Communication:  staff  Labs/tests ordered: NA

## 2018-09-24 ENCOUNTER — Non-Acute Institutional Stay (SKILLED_NURSING_FACILITY): Payer: Medicare Other | Admitting: Internal Medicine

## 2018-09-24 ENCOUNTER — Encounter: Payer: Self-pay | Admitting: Internal Medicine

## 2018-09-24 DIAGNOSIS — F329 Major depressive disorder, single episode, unspecified: Secondary | ICD-10-CM | POA: Diagnosis not present

## 2018-09-24 DIAGNOSIS — G309 Alzheimer's disease, unspecified: Secondary | ICD-10-CM

## 2018-09-24 DIAGNOSIS — F0150 Vascular dementia without behavioral disturbance: Secondary | ICD-10-CM

## 2018-09-24 DIAGNOSIS — F0280 Dementia in other diseases classified elsewhere without behavioral disturbance: Secondary | ICD-10-CM | POA: Diagnosis not present

## 2018-09-24 DIAGNOSIS — G459 Transient cerebral ischemic attack, unspecified: Secondary | ICD-10-CM | POA: Diagnosis not present

## 2018-09-24 NOTE — Progress Notes (Signed)
Patient ID: [name], male   DOB: [birth year], 77 y.o.   MRN: [id number]  Location:  [name] Room Number: 120  Place of Service:  SNF ([phone]) Provider:   Gayland Curry, DO  Patient Care Team: Gayland Curry, DO as PCP - General (Geriatric Medicine)  Extended Emergency Contact Information Primary Emergency Contact: [name] Address: [address]          [place], [place] [address] [name] of [place] Phone: [phone] Mobile Phone: [phone] Relation: Spouse  Code Status:  DNR; need MOST Goals of care: Advanced Directive information Advanced Directives 09/24/2018  Does Patient Have a Medical Advance Directive? Yes  Type of Advance Directive Out of facility DNR (pink MOST or yellow form);Living will;Healthcare Power of Attorney  Does patient want to make changes to medical advance directive? No - Patient declined  Copy of [place] in Chart? Yes - validated most recent copy scanned in chart (See row information)  Would patient like information on creating a medical advance directive? -  Pre-existing out of facility DNR order (yellow form or pink MOST form) Yellow form placed in chart (order not valid for inpatient use)   Chief Complaint  Patient presents with  . Acute Visit    possible TIA   HPI:  Pt is a 77 y.o. male with h/o mixed AD and vascular dementia, DMII, OSA, BPH, tremor, peripheral neuropathy seen today for an acute visit for possible TIA over the weekend.  His wife, [name], asked if he could be checked.   12/14, nurse manager was called to the unit due to patient's change in condition--he  called to unit for change in condition. Per the nursing notes, pt had left sided facial droop and unequal left hand grip (weak) and could not follow commands to lift his legs.  Per his aides, he was unable to spit the toothpaste out of his left side and he was less responsive. Vitals were normal outside of some  mild hypertension in the 150s.  His wife was notified. The hand grip improved, but he maintained the facial droop a while longer.  He opened his eyes and smiled when she arrived and asymmetry resolved.  He ate a late breakfasts feeding himself w/o difficulty.   He also swallowed his medications w/o a problem. It appears that after that, outside of not eating much breakfast the following morning, he was back to baseline.  When I saw him, he interacted as usual.  He does seem to struggle a little more each visit with his word-finding.  He had not complaints.  As soon as [name] was mentioned, he lit up, smiled and laughed.    Past Medical History:  Diagnosis Date  . Abnormality of gait 04/05/2015  . Benign prostatic hypertrophy   . Dementia ([place])   . Diabetes mellitus type II    no meds  . Elevated PSA   . Erectile dysfunction   . Gout   . Hereditary and idiopathic peripheral neuropathy 08/02/2016  . Hypertension   . Memory changes   . Memory difficulties 04/05/2015  . Nephrolithiasis    hx of  . Obstructive sleep apnea   . Partial Achilles tendon tear   . Rosacea, acne   . Tremor 04/05/2015   Past Surgical History:  Procedure Laterality Date  . CARPAL TUNNEL RELEASE  2004  . CARPAL TUNNEL RELEASE  10/31/2011   Procedure: CARPAL TUNNEL RELEASE;  Surgeon: Youlanda Mighty Sypher  Brooke Bonito., MD;  Location: [facility];  Service: Orthopedics;  Laterality: Left;  . EYE SURGERY     cataracts  . HAND SURGERY  2007   right hand  . INGUINAL HERNIA REPAIR    . JOINT REPLACEMENT     left total knee  . KNEE SURGERY  1961   left knee  . TONSILLECTOMY    . TONSILLECTOMY AND ADENOIDECTOMY    . TRANSURETHRAL RESECTION OF PROSTATE      Allergies  Allergen Reactions  . Ciprofloxacin     REACTION: Diarrhea,l rectal irritation    Outpatient Encounter Medications as of 09/24/2018  Medication Sig  . allopurinol (ZYLOPRIM) 100 MG tablet Take 100 mg by mouth daily.   Marland Kitchen amLODipine (NORVASC) 10 MG  tablet Take 10 mg by mouth daily.  Marland Kitchen aspirin 81 MG tablet Take 81 mg by mouth daily.  Marland Kitchen buPROPion (WELLBUTRIN XL) 150 MG 24 hr tablet Take 150 mg by mouth daily.  . Cholecalciferol (VITAMIN D-3 PO) Take 2,000 Units by mouth daily.   . diclofenac sodium (VOLTAREN) 1 % GEL Apply 2 g topically daily as needed.  . donepezil (ARICEPT) 10 MG tablet Take 10 mg by mouth at bedtime.  . finasteride (PROSCAR) 5 MG tablet Take 5 mg by mouth at bedtime.   . hydrocortisone 2.5 % cream Apply 1 application topically as needed.  Marland Kitchen lisinopril (PRINIVIL,ZESTRIL) 40 MG tablet Take 40 mg by mouth daily.   . memantine (NAMENDA) 10 MG tablet Take 1 tablet by mouth 2 (two) times daily.  . metFORMIN (GLUCOPHAGE) 500 MG tablet Take 500 mg by mouth daily with breakfast.  . pantoprazole (PROTONIX) 40 MG tablet Take 40 mg by mouth daily.   . sertraline (ZOLOFT) 100 MG tablet Take 100 mg by mouth at bedtime.   No facility-administered encounter medications on file as of 09/24/2018.     Review of Systems  Constitutional: Negative for activity change, appetite change, chills and fever.  HENT: Negative for congestion and trouble swallowing.   Eyes: Negative for visual disturbance.  Respiratory: Negative for chest tightness and shortness of breath.   Cardiovascular: Negative for chest pain and palpitations.  Gastrointestinal: Negative for abdominal pain, diarrhea, nausea and vomiting.  Genitourinary: Negative for dysuria.  Musculoskeletal: Positive for arthralgias and gait problem. Negative for back pain.       Denied knee pain today  Skin: Negative for color change.  Neurological: Negative for dizziness and weakness.       Has neuropathy of feet and legs  Psychiatric/Behavioral: Positive for confusion. Negative for agitation, behavioral problems and sleep disturbance.    Immunization History  Administered Date(s) Administered  . Influenza Split 08/18/2011, 08/08/2012  . Influenza Whole 08/21/2006, 07/11/2007,  09/13/2009, 07/13/2010  . Influenza, Seasonal, Injecte, Preservative Fre 07/10/2013  . Influenza,inj,Quad PF,6+ Mos 07/30/2018  . Influenza-Unspecified 08/08/2012, 07/17/2014, 08/10/2014, 06/16/2015, 07/30/2017  . Pneumococcal Conjugate-13 01/06/2014  . Pneumococcal Polysaccharide-23 03/12/2012, 03/10/2014  . Td 10/09/1992  . Tdap 03/12/2012, 01/08/2014  . Zoster 12/26/2007, 03/10/2014  . Zoster Recombinat (Shingrix) 01/23/2018   Pertinent  Health Maintenance Due  Topic Date Due  . HEMOGLOBIN A1C  11/17/2018  . FOOT EXAM  01/08/2019  . OPHTHALMOLOGY EXAM  04/04/2019  . INFLUENZA VACCINE  Completed  . PNA vac Low Risk Adult  Completed  . COLONOSCOPY  Discontinued   Fall Risk  04/30/2018  Falls in the past year? No   Functional Status Survey:    Vitals:   09/24/18 1436  BP: (!) 144/77  Pulse: 67  Resp: 17  Temp: 98.4 F (36.9 C)  TempSrc: Oral  SpO2: 96%  Weight: 262 lb (118.8 kg)  Height: 6\' 3"  (1.905 m)   Body mass index is 32.75 kg/m. Physical Exam Vitals signs and nursing note reviewed.  Constitutional:      Appearance: Normal appearance.  HENT:     Head: Normocephalic and atraumatic.  Eyes:     Extraocular Movements: Extraocular movements intact.     Pupils: Pupils are equal, round, and reactive to light.  Cardiovascular:     Rate and Rhythm: Normal rate and regular rhythm.     Pulses: Normal pulses.     Heart sounds: Normal heart sounds.  Pulmonary:     Effort: Pulmonary effort is normal.     Breath sounds: Normal breath sounds. No wheezing, rhonchi or rales.  Abdominal:     General: Bowel sounds are normal.     Palpations: Abdomen is soft.  Neurological:     General: No focal deficit present.     Mental Status: He is alert. Mental status is at baseline.     Cranial Nerves: No cranial nerve deficit.     Motor: No weakness.     Coordination: Coordination normal.     Comments: Able to move all 4 extremities including lifting feet, equal handgrips, no  facial droop, symmetric smile and midline tongue; usual aphasia   Psychiatric:        Mood and Affect: Mood normal.     Labs reviewed: Recent Labs    11/12/17 1718 11/21/17 11/21/17 0730 05/17/18 0900  NA 138 143 143 140  K 4.0 4.7 4.7 4.3  CL 105  --   --   --   CO2 25  --   --   --   GLUCOSE 182*  --   --   --   BUN 18 22* 22* 25*  CREATININE 1.47* 1.4* 1.4* 1.3  CALCIUM 8.8*  --   --   --    Recent Labs    11/12/17 1718  AST 24  ALT 20  ALKPHOS 72  BILITOT 0.5  PROT 6.5  ALBUMIN 3.8   Recent Labs    11/12/17 1718  WBC 7.8  NEUTROABS 4.7  HGB 11.3*  HCT 34.8*  MCV 65.3*  PLT 172   Lab Results  Component Value Date   TSH 1.14 05/17/2018   Lab Results  Component Value Date   HGBA1C 6.8 05/17/2018   Lab Results  Component Value Date   CHOL 151 11/11/2013   HDL 33.00 (L) 11/11/2013   LDLCALC 96 11/11/2013   LDLDIRECT 97.8 05/26/2009   TRIG 112.0 11/11/2013   CHOLHDL 5 11/11/2013    Assessment/Plan 1. TIA (transient ischemic attack) -seems he did have a TIA over the weekend, but is back to baseline -continue secondary prevention of stroke--should keep bp under 150/90 for sure on a regular basis so if bps over this, they should be rechecked manually and reported to myself or NP -cont current regimen right now as automatic readings inadequate to determine adjustment need  2. Mixed Alzheimer's and vascular dementia ([place]) -gradually progressive with aphasia component being most prominent for him -cont current regimen and SNF support, caregiver  3. Reactive depression -seems improved to me last two visits with consistent caregiver support--cont zoloft and wellbutrin therapy  Family/ staff Communication: discussed with SNF nurse  Labs/tests ordered:  No new  Jovannie Ulibarri L. Aneeka Bowden, D.O. Geriatrics [employer]  [facility] [address], [place] [address] Cell Phone (Mon-Fri 8am-5pm):  [phone] On Call:  [phone] &  follow prompts after 5pm & weekends Office Phone:  [phone] Office Fax:  [phone]

## 2018-09-27 ENCOUNTER — Encounter: Payer: Self-pay | Admitting: Internal Medicine

## 2018-09-27 DIAGNOSIS — G459 Transient cerebral ischemic attack, unspecified: Secondary | ICD-10-CM | POA: Insufficient documentation

## 2018-10-28 ENCOUNTER — Non-Acute Institutional Stay (SKILLED_NURSING_FACILITY): Payer: Medicare Other | Admitting: Adult Health

## 2018-10-28 ENCOUNTER — Encounter: Payer: Self-pay | Admitting: Adult Health

## 2018-10-28 DIAGNOSIS — F329 Major depressive disorder, single episode, unspecified: Secondary | ICD-10-CM

## 2018-10-28 DIAGNOSIS — N183 Chronic kidney disease, stage 3 unspecified: Secondary | ICD-10-CM

## 2018-10-28 DIAGNOSIS — F0280 Dementia in other diseases classified elsewhere without behavioral disturbance: Secondary | ICD-10-CM | POA: Diagnosis not present

## 2018-10-28 DIAGNOSIS — G4733 Obstructive sleep apnea (adult) (pediatric): Secondary | ICD-10-CM

## 2018-10-28 DIAGNOSIS — E119 Type 2 diabetes mellitus without complications: Secondary | ICD-10-CM

## 2018-10-28 DIAGNOSIS — I129 Hypertensive chronic kidney disease with stage 1 through stage 4 chronic kidney disease, or unspecified chronic kidney disease: Secondary | ICD-10-CM

## 2018-10-28 DIAGNOSIS — M1A30X0 Chronic gout due to renal impairment, unspecified site, without tophus (tophi): Secondary | ICD-10-CM | POA: Diagnosis not present

## 2018-10-28 DIAGNOSIS — G309 Alzheimer's disease, unspecified: Secondary | ICD-10-CM

## 2018-10-28 DIAGNOSIS — F0150 Vascular dementia without behavioral disturbance: Secondary | ICD-10-CM | POA: Diagnosis not present

## 2018-10-28 DIAGNOSIS — G459 Transient cerebral ischemic attack, unspecified: Secondary | ICD-10-CM | POA: Diagnosis not present

## 2018-10-28 NOTE — Progress Notes (Signed)
Location:  [facility] of Service:  SNF (31) Provider:   Cindi Carbon, ANP [place] [phone]   Gayland Curry, DO  Patient Care Team: Gayland Curry, DO as PCP - General (Geriatric Medicine)  Extended Emergency Contact Information Primary Emergency Contact: Hoos,[name] Address: [address]          [place], [place] [address] [name] of [place] Phone: [phone] Mobile Phone: [phone] Relation: Spouse  Code Status:  DNR Goals of care: Advanced Directive information Advanced Directives 09/24/2018  Does Patient Have a Medical Advance Directive? Yes  Type of Advance Directive Out of facility DNR (pink MOST or yellow form);Living will;Healthcare Power of Attorney  Does patient want to make changes to medical advance directive? No - Patient declined  Copy of [place] in Chart? Yes - validated most recent copy scanned in chart (See row information)  Would patient like information on creating a medical advance directive? -  Pre-existing out of facility DNR order (yellow form or pink MOST form) Yellow form placed in chart (order not valid for inpatient use)     Chief Complaint  Patient presents with  . Medical Management of Chronic Issues    HPI:  Pt is a 78 y.o. male seen today for medical management of chronic diseases.    He had a TIA last month but has not had any further issues.  CBGS in the am 135-147  Lab Results  Component Value Date   HGBA1C 6.8 05/17/2018   HTN: SBP range 130-140  Depression: improvement in mood noted   AD/Vascular dementia: he has a hx of playing football which likely contributed to his symptoms per previous notes. Also was +APO4. No issues with behaviors. Incontinent and requires a lift for transfers.Very low MMSE scores due to aphasia.   No bouts of gout  CKD Lab Results  Component Value Date   BUN 25 (A) 05/17/2018    Lab Results  Component  Value Date   CREATININE 1.3 05/17/2018     Functional status: hoyer lift, and incontinent  Past Medical History:  Diagnosis Date  . Abnormality of gait 04/05/2015  . Benign prostatic hypertrophy   . Dementia ([place])   . Diabetes mellitus type II    no meds  . Elevated PSA   . Erectile dysfunction   . Gout   . Hereditary and idiopathic peripheral neuropathy 08/02/2016  . Hypertension   . Memory changes   . Memory difficulties 04/05/2015  . Nephrolithiasis    hx of  . Obstructive sleep apnea   . Partial Achilles tendon tear   . Rosacea, acne   . Tremor 04/05/2015   Past Surgical History:  Procedure Laterality Date  . CARPAL TUNNEL RELEASE  2004  . CARPAL TUNNEL RELEASE  10/31/2011   Procedure: CARPAL TUNNEL RELEASE;  Surgeon: Cammie Sickle., MD;  Location: [facility];  Service: Orthopedics;  Laterality: Left;  . EYE SURGERY     cataracts  . HAND SURGERY  2007   right hand  . INGUINAL HERNIA REPAIR    . JOINT REPLACEMENT     left total knee  . KNEE SURGERY  1961   left knee  . TONSILLECTOMY    . TONSILLECTOMY AND ADENOIDECTOMY    . TRANSURETHRAL RESECTION OF PROSTATE      Allergies  Allergen Reactions  . Ciprofloxacin     REACTION: Diarrhea,l rectal irritation    Outpatient  Encounter Medications as of 10/28/2018  Medication Sig  . allopurinol (ZYLOPRIM) 100 MG tablet Take 100 mg by mouth daily.   Marland Kitchen amLODipine (NORVASC) 10 MG tablet Take 10 mg by mouth daily.  Marland Kitchen aspirin 81 MG tablet Take 81 mg by mouth daily.  Marland Kitchen buPROPion (WELLBUTRIN XL) 150 MG 24 hr tablet Take 150 mg by mouth daily.  . carboxymethylcellulose (REFRESH PLUS) 0.5 % SOLN 2 drops 2 (two) times daily as needed.  . Cholecalciferol (VITAMIN D-3 PO) Take 2,000 Units by mouth daily.   . diclofenac sodium (VOLTAREN) 1 % GEL Apply 2 g topically daily as needed.  . donepezil (ARICEPT) 10 MG tablet Take 10 mg by mouth at bedtime.  . finasteride (PROSCAR) 5 MG tablet Take 5 mg by mouth at  bedtime.   . hydrocortisone 2.5 % cream Apply 1 application topically as needed.  Marland Kitchen lisinopril (PRINIVIL,ZESTRIL) 40 MG tablet Take 40 mg by mouth daily.   . memantine (NAMENDA) 10 MG tablet Take 1 tablet by mouth 2 (two) times daily.  . metFORMIN (GLUCOPHAGE) 500 MG tablet Take 500 mg by mouth daily with breakfast.  . pantoprazole (PROTONIX) 40 MG tablet Take 40 mg by mouth daily.   . sertraline (ZOLOFT) 100 MG tablet Take 100 mg by mouth at bedtime.   No facility-administered encounter medications on file as of 10/28/2018.     Review of Systems  Unable to perform ROS: Dementia    Immunization History  Administered Date(s) Administered  . Influenza Split 08/18/2011, 08/08/2012  . Influenza Whole 08/21/2006, 07/11/2007, 09/13/2009, 07/13/2010  . Influenza, Seasonal, Injecte, Preservative Fre 07/10/2013  . Influenza,inj,Quad PF,6+ Mos 07/30/2018  . Influenza-Unspecified 08/08/2012, 07/17/2014, 08/10/2014, 06/16/2015, 07/30/2017  . Pneumococcal Conjugate-13 01/06/2014  . Pneumococcal Polysaccharide-23 03/12/2012, 03/10/2014  . Td 10/09/1992  . Tdap 03/12/2012, 01/08/2014  . Zoster 12/26/2007, 03/10/2014  . Zoster Recombinat (Shingrix) 01/23/2018   Pertinent  Health Maintenance Due  Topic Date Due  . HEMOGLOBIN A1C  11/17/2018  . FOOT EXAM  01/08/2019  . OPHTHALMOLOGY EXAM  04/04/2019  . INFLUENZA VACCINE  Completed  . PNA vac Low Risk Adult  Completed  . COLONOSCOPY  Discontinued   Fall Risk  04/30/2018  Falls in the past year? No   Functional Status Survey:    Vitals:   10/28/18 1621  Weight: 262 lb (118.8 kg)   Body mass index is 32.75 kg/m.  Wt Readings from Last 3 Encounters:  10/28/18 262 lb (118.8 kg)  09/24/18 262 lb (118.8 kg)  08/29/18 264 lb 11.2 oz (120.1 kg)   Physical Exam Vitals signs and nursing note reviewed.  Constitutional:      General: He is not in acute distress.    Appearance: He is not diaphoretic.  HENT:     Head: Normocephalic and  atraumatic.     Mouth/Throat:     Pharynx: No oropharyngeal exudate.  Neck:     Thyroid: No thyromegaly.     Vascular: No JVD.     Trachea: No tracheal deviation.  Cardiovascular:     Rate and Rhythm: Normal rate and regular rhythm.     Heart sounds: No murmur.  Pulmonary:     Effort: Pulmonary effort is normal. No respiratory distress.     Breath sounds: Normal breath sounds. No wheezing.  Abdominal:     General: Bowel sounds are normal. There is no distension.     Palpations: Abdomen is soft.     Tenderness: There is no abdominal tenderness.  Musculoskeletal:  General: No tenderness or deformity.  Lymphadenopathy:     Cervical: No cervical adenopathy.  Skin:    General: Skin is warm and dry.  Neurological:     General: No focal deficit present.     Mental Status: He is alert. Mental status is at baseline.     Cranial Nerves: No cranial nerve deficit.     Comments: Minimally verbal, smiles and able to intermittently follow commands.   Psychiatric:        Mood and Affect: Mood normal.     Labs reviewed: Recent Labs    11/12/17 1718 11/21/17 11/21/17 0730 05/17/18 0900  NA 138 143 143 140  K 4.0 4.7 4.7 4.3  CL 105  --   --   --   CO2 25  --   --   --   GLUCOSE 182*  --   --   --   BUN 18 22* 22* 25*  CREATININE 1.47* 1.4* 1.4* 1.3  CALCIUM 8.8*  --   --   --    Recent Labs    11/12/17 1718  AST 24  ALT 20  ALKPHOS 72  BILITOT 0.5  PROT 6.5  ALBUMIN 3.8   Recent Labs    11/12/17 1718  WBC 7.8  NEUTROABS 4.7  HGB 11.3*  HCT 34.8*  MCV 65.3*  PLT 172   Lab Results  Component Value Date   TSH 1.14 05/17/2018   Lab Results  Component Value Date   HGBA1C 6.8 05/17/2018   Lab Results  Component Value Date   CHOL 151 11/11/2013   HDL 33.00 (L) 11/11/2013   LDLCALC 96 11/11/2013   LDLDIRECT 97.8 05/26/2009   TRIG 112.0 11/11/2013   CHOLHDL 5 11/11/2013    Significant Diagnostic Results in last 30 days:  No results  found.  Assessment/Plan  1. 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 Continue supportive care. Continue Namenda.   2. TIA (transient ischemic attack) No further issues.  Continue baby aspirin  3. Severe obstructive sleep apnea Continue cpap qhs  4. Diabetes mellitus type 2 without retinopathy ([place]) Controlled Continue metformin and diet  5. Chronic kidney disease, stage III (moderate) (HCC) Continue to periodically monitor BMP and avoid nephrotoxic agents  6. Chronic gout due to renal impairment without tophus, unspecified site Continue Allopurinol 100 mg qd  7. Reactive depression Improved, continue wellbutrin and zoloft  8. Benign hypertension with chronic kidney disease, stage III (HCC) Controlled Continue lisinopril at 40 mg qd   Family/ staff Communication: staff  Labs/tests ordered: Labs due in Feb

## 2018-11-21 ENCOUNTER — Non-Acute Institutional Stay (SKILLED_NURSING_FACILITY): Payer: Medicare Other | Admitting: Adult Health

## 2018-11-21 ENCOUNTER — Encounter: Payer: Self-pay | Admitting: Adult Health

## 2018-11-21 DIAGNOSIS — F329 Major depressive disorder, single episode, unspecified: Secondary | ICD-10-CM

## 2018-11-21 DIAGNOSIS — N401 Enlarged prostate with lower urinary tract symptoms: Secondary | ICD-10-CM | POA: Diagnosis not present

## 2018-11-21 DIAGNOSIS — E119 Type 2 diabetes mellitus without complications: Secondary | ICD-10-CM

## 2018-11-21 DIAGNOSIS — M1A30X0 Chronic gout due to renal impairment, unspecified site, without tophus (tophi): Secondary | ICD-10-CM | POA: Diagnosis not present

## 2018-11-21 DIAGNOSIS — F0150 Vascular dementia without behavioral disturbance: Secondary | ICD-10-CM

## 2018-11-21 DIAGNOSIS — N183 Chronic kidney disease, stage 3 unspecified: Secondary | ICD-10-CM

## 2018-11-21 DIAGNOSIS — G4733 Obstructive sleep apnea (adult) (pediatric): Secondary | ICD-10-CM | POA: Diagnosis not present

## 2018-11-21 DIAGNOSIS — F0280 Dementia in other diseases classified elsewhere without behavioral disturbance: Secondary | ICD-10-CM | POA: Diagnosis not present

## 2018-11-21 DIAGNOSIS — G309 Alzheimer's disease, unspecified: Secondary | ICD-10-CM

## 2018-11-21 NOTE — Progress Notes (Signed)
Location:  [facility] of Service:  SNF (31) Provider:   Cindi Carbon, ANP [place] [phone]   Gayland Curry, DO  Patient Care Team: Gayland Curry, DO as PCP - General (Geriatric Medicine)  Extended Emergency Contact Information Primary Emergency Contact: Froberg,[name] Address: [address]          [place], [place] [address] [name] of [place] Phone: [phone] Mobile Phone: [phone] Relation: Spouse  Code Status:  DNR Goals of care: Advanced Directive information Advanced Directives 09/24/2018  Does Patient Have a Medical Advance Directive? Yes  Type of Advance Directive Out of facility DNR (pink MOST or yellow form);Living will;Healthcare Power of Attorney  Does patient want to make changes to medical advance directive? No - Patient declined  Copy of [place] in Chart? Yes - validated most recent copy scanned in chart (See row information)  Would patient like information on creating a medical advance directive? -  Pre-existing out of facility DNR order (yellow form or pink MOST form) Yellow form placed in chart (order not valid for inpatient use)     Chief Complaint  Patient presents with  . Medical Management of Chronic Issues    HPI:  Pt is a 78 y.o. male seen today for medical management of chronic diseases.    CBGS in the am 130's Lab Results  Component Value Date   HGBA1C 6.8 05/17/2018   HTN: SBP controlled with no edema, sob or cp  BPH on proscar with incontinence. No issues with frequency or urinary retention  AD/Vascular dementia: he has a hx of playing football which likely contributed to his symptoms per previous notes. Also was +APO4. No issues with behaviors. Incontinent and requires a lift for transfers.Very low MMSE scores due to aphasia.   No bouts of gout  CKD Lab Results  Component Value Date   BUN 25 (A) 05/17/2018    Lab Results  Component Value  Date   CREATININE 1.3 05/17/2018   No issues with daytime sleepiness or fit of mask used for cpap  Mood seems to be fairly good with no weight loss or difficulty sleeping, or angry outburst reported.   Functional status: hoyer lift, and incontinent  Past Medical History:  Diagnosis Date  . Abnormality of gait 04/05/2015  . Benign prostatic hypertrophy   . Dementia ([place])   . Diabetes mellitus type II    no meds  . Elevated PSA   . Erectile dysfunction   . Gout   . Hereditary and idiopathic peripheral neuropathy 08/02/2016  . Hypertension   . Memory changes   . Memory difficulties 04/05/2015  . Nephrolithiasis    hx of  . Obstructive sleep apnea   . Partial Achilles tendon tear   . Rosacea, acne   . Tremor 04/05/2015   Past Surgical History:  Procedure Laterality Date  . CARPAL TUNNEL RELEASE  2004  . CARPAL TUNNEL RELEASE  10/31/2011   Procedure: CARPAL TUNNEL RELEASE;  Surgeon: Cammie Sickle., MD;  Location: [facility];  Service: Orthopedics;  Laterality: Left;  . EYE SURGERY     cataracts  . HAND SURGERY  2007   right hand  . INGUINAL HERNIA REPAIR    . JOINT REPLACEMENT     left total knee  . KNEE SURGERY  1961   left knee  . TONSILLECTOMY    . TONSILLECTOMY AND ADENOIDECTOMY    . TRANSURETHRAL RESECTION  OF PROSTATE      Allergies  Allergen Reactions  . Ciprofloxacin     REACTION: Diarrhea,l rectal irritation    Outpatient Encounter Medications as of 11/21/2018  Medication Sig  . allopurinol (ZYLOPRIM) 100 MG tablet Take 100 mg by mouth daily.   Marland Kitchen amLODipine (NORVASC) 10 MG tablet Take 10 mg by mouth daily.  Marland Kitchen aspirin 81 MG tablet Take 81 mg by mouth daily.  Marland Kitchen buPROPion (WELLBUTRIN XL) 150 MG 24 hr tablet Take 150 mg by mouth daily.  . carboxymethylcellulose (REFRESH PLUS) 0.5 % SOLN 2 drops 2 (two) times daily as needed.  . Cholecalciferol (VITAMIN D-3 PO) Take 2,000 Units by mouth daily.   . diclofenac sodium (VOLTAREN) 1 % GEL Apply 2  g topically daily as needed.  . donepezil (ARICEPT) 10 MG tablet Take 10 mg by mouth at bedtime.  . finasteride (PROSCAR) 5 MG tablet Take 5 mg by mouth at bedtime.   . hydrocortisone 2.5 % cream Apply 1 application topically as needed.  Marland Kitchen lisinopril (PRINIVIL,ZESTRIL) 40 MG tablet Take 40 mg by mouth daily.   . memantine (NAMENDA) 10 MG tablet Take 1 tablet by mouth 2 (two) times daily.  . metFORMIN (GLUCOPHAGE) 500 MG tablet Take 500 mg by mouth daily with breakfast.  . pantoprazole (PROTONIX) 40 MG tablet Take 40 mg by mouth daily.   . sertraline (ZOLOFT) 100 MG tablet Take 100 mg by mouth at bedtime.   No facility-administered encounter medications on file as of 11/21/2018.     Review of Systems  Unable to perform ROS: Dementia    Immunization History  Administered Date(s) Administered  . Influenza Split 08/18/2011, 08/08/2012  . Influenza Whole 08/21/2006, 07/11/2007, 09/13/2009, 07/13/2010  . Influenza, Seasonal, Injecte, Preservative Fre 07/10/2013  . Influenza,inj,Quad PF,6+ Mos 07/30/2018  . Influenza-Unspecified 08/08/2012, 07/17/2014, 08/10/2014, 06/16/2015, 07/30/2017  . Pneumococcal Conjugate-13 01/06/2014  . Pneumococcal Polysaccharide-23 03/12/2012, 03/10/2014  . Td 10/09/1992  . Tdap 03/12/2012, 01/08/2014  . Zoster 12/26/2007, 03/10/2014  . Zoster Recombinat (Shingrix) 01/23/2018   Pertinent  Health Maintenance Due  Topic Date Due  . HEMOGLOBIN A1C  11/17/2018  . FOOT EXAM  01/08/2019  . OPHTHALMOLOGY EXAM  04/04/2019  . INFLUENZA VACCINE  Completed  . PNA vac Low Risk Adult  Completed  . COLONOSCOPY  Discontinued   Fall Risk  04/30/2018  Falls in the past year? No   Functional Status Survey:    Vitals:   11/21/18 1648  Weight: 264 lb 8 oz (120 kg)   Body mass index is 33.06 kg/m.  Wt Readings from Last 3 Encounters:  11/21/18 264 lb 8 oz (120 kg)  10/28/18 262 lb (118.8 kg)  09/24/18 262 lb (118.8 kg)   Physical Exam Vitals signs and nursing  note reviewed.  Constitutional:      General: He is not in acute distress.    Appearance: He is not diaphoretic.  HENT:     Head: Normocephalic and atraumatic.     Mouth/Throat:     Pharynx: No oropharyngeal exudate.  Neck:     Thyroid: No thyromegaly.     Vascular: No JVD.     Trachea: No tracheal deviation.  Cardiovascular:     Rate and Rhythm: Normal rate and regular rhythm.     Heart sounds: No murmur.  Pulmonary:     Effort: Pulmonary effort is normal. No respiratory distress.     Breath sounds: Normal breath sounds. No wheezing.  Abdominal:     General:  Bowel sounds are normal. There is no distension.     Palpations: Abdomen is soft.     Tenderness: There is no abdominal tenderness.  Musculoskeletal:        General: No tenderness or deformity.  Lymphadenopathy:     Cervical: No cervical adenopathy.  Skin:    General: Skin is warm and dry.  Neurological:     General: No focal deficit present.     Mental Status: He is alert. Mental status is at baseline.     Cranial Nerves: No cranial nerve deficit.     Comments: Minimally verbal, smiles and able to intermittently follow commands.   Psychiatric:        Mood and Affect: Mood normal.     Labs reviewed: Recent Labs    05/17/18 0900  NA 140  K 4.3  BUN 25*  CREATININE 1.3   No results for input(s): AST, ALT, ALKPHOS, BILITOT, PROT, ALBUMIN in the last 8760 hours. No results for input(s): WBC, NEUTROABS, HGB, HCT, MCV, PLT in the last 8760 hours. Lab Results  Component Value Date   TSH 1.14 05/17/2018   Lab Results  Component Value Date   HGBA1C 6.8 05/17/2018   Lab Results  Component Value Date   CHOL 151 11/11/2013   HDL 33.00 (L) 11/11/2013   LDLCALC 96 11/11/2013   LDLDIRECT 97.8 05/26/2009   TRIG 112.0 11/11/2013   CHOLHDL 5 11/11/2013    Significant Diagnostic Results in last 30 days:  No results found.  Assessment/Plan  1. Mixed Alzheimer's and vascular dementia ([place]) Severe in nature and  requires assistance with all ADLs Continue aricept and namenda combo  2. Severe obstructive sleep apnea No reported issues with current mask/therapy  3. Diabetes mellitus type 2 without retinopathy ([place]) Needs A1C Current CBGS are within acceptable range Continue metformin  4. Chronic kidney disease, stage III (moderate) (HCC) Continue to periodically monitor BMP and avoid nephrotoxic agents  5. Benign non-nodular prostatic hyperplasia with lower urinary tract symptoms Controlled with prosca 5 mg qhs  6. Chronic gout due to renal impairment without tophus, unspecified site No flares, continue allopurinol   7. Reactive depression Mood seems improved with current regiment of Wellbutrin and Zoloft    Family/ staff Communication: staff  Labs/tests ordered: BMP CBC TSH A1C

## 2018-11-22 DIAGNOSIS — I10 Essential (primary) hypertension: Secondary | ICD-10-CM | POA: Diagnosis not present

## 2018-11-22 DIAGNOSIS — E039 Hypothyroidism, unspecified: Secondary | ICD-10-CM | POA: Diagnosis not present

## 2018-11-22 DIAGNOSIS — E119 Type 2 diabetes mellitus without complications: Secondary | ICD-10-CM | POA: Diagnosis not present

## 2018-11-22 DIAGNOSIS — D649 Anemia, unspecified: Secondary | ICD-10-CM | POA: Diagnosis not present

## 2018-11-22 DIAGNOSIS — N19 Unspecified kidney failure: Secondary | ICD-10-CM | POA: Diagnosis not present

## 2018-11-22 LAB — CBC AND DIFFERENTIAL
HCT: 35 — AB (ref 41–53)
Hemoglobin: 11.5 — AB (ref 13.5–17.5)
Platelets: 191 (ref 150–399)
WBC: 7

## 2018-11-22 LAB — BASIC METABOLIC PANEL
BUN: 24 — AB (ref 4–21)
Creatinine: 1.5 — AB (ref 0.6–1.3)
Glucose: 128
Potassium: 4.1 (ref 3.4–5.3)
Sodium: 140 (ref 137–147)

## 2018-11-22 LAB — HEMOGLOBIN A1C: Hemoglobin A1C: 6.6

## 2018-11-22 LAB — TSH: TSH: 1.74 (ref 0.41–5.90)

## 2018-12-17 ENCOUNTER — Encounter: Payer: Self-pay | Admitting: Internal Medicine

## 2018-12-17 ENCOUNTER — Non-Acute Institutional Stay (SKILLED_NURSING_FACILITY): Payer: Medicare Other | Admitting: Internal Medicine

## 2018-12-17 ENCOUNTER — Other Ambulatory Visit: Payer: Self-pay | Admitting: Internal Medicine

## 2018-12-17 DIAGNOSIS — R4189 Other symptoms and signs involving cognitive functions and awareness: Secondary | ICD-10-CM | POA: Diagnosis not present

## 2018-12-17 DIAGNOSIS — G4733 Obstructive sleep apnea (adult) (pediatric): Secondary | ICD-10-CM | POA: Diagnosis not present

## 2018-12-17 DIAGNOSIS — F0150 Vascular dementia without behavioral disturbance: Secondary | ICD-10-CM | POA: Diagnosis not present

## 2018-12-17 DIAGNOSIS — R1310 Dysphagia, unspecified: Secondary | ICD-10-CM | POA: Diagnosis not present

## 2018-12-17 DIAGNOSIS — F0280 Dementia in other diseases classified elsewhere without behavioral disturbance: Secondary | ICD-10-CM | POA: Diagnosis not present

## 2018-12-17 DIAGNOSIS — G309 Alzheimer's disease, unspecified: Secondary | ICD-10-CM

## 2018-12-17 DIAGNOSIS — R4689 Other symptoms and signs involving appearance and behavior: Secondary | ICD-10-CM

## 2018-12-17 NOTE — Progress Notes (Signed)
Patient ID: [name], male   DOB: Dec 28, 1940, 78 y.o.   MRN: [id number]  Location:  [facility] Room Number: 120 Place of Service:  SNF ([phone]) Provider:   Gayland Curry, DO  Patient Care Team: Gayland Curry, DO as PCP - General (Geriatric Medicine)  Extended Emergency Contact Information Primary Emergency Contact: Huntsman,[name] Address: [name]          [place], [place] [address] [name] of [place] Phone: [phone] Mobile Phone: [phone] Relation: Spouse  Code Status:  DNR Goals of care: Advanced Directive information Advanced Directives 12/17/2018  Does Patient Have a Medical Advance Directive? Yes  Type of Paramedic of [place];Living will;Out of facility DNR (pink MOST or yellow form)  Does patient want to make changes to medical advance directive? No - Patient declined  Copy of [place] in Chart? Yes - validated most recent copy scanned in chart (See row information)  Would patient like information on creating a medical advance directive? -  Pre-existing out of facility DNR order (yellow form or pink MOST form) Yellow form placed in chart (order not valid for inpatient use)     Chief Complaint  Patient presents with  . Acute Visit    HPI:  Pt is a 78 y.o. male seen today for an acute visit for changes in his behavior recently, decreased po intake, decreased verbal communication, difficulty swallowing and spitting out his medications, agitated with care.  He was telling his wife he cannot speak above a whisper and he was the same way during our visit.  He remained pleasant but spoke considerably less than he has and voice was mostly too soft to understand even in close proximity.  Affect remains flat, he smiled less.  The changes have been going on for about a week now.  He's been more agitated on bath days.  He's getting tylenol for pain, but always denies having pain.   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  He is down 20 lbs over the past year.  [name] notes he has had a diminished appetite which is definitely not like Ed.  I spoke with his wife, [name], by phone, and she also mentioned it's been a struggle to get pt's CPAP supplies through pulmonary at [facility] (Dr. Johna Sheriff office)--nurse looked up number and I requested that the nursing station call their office rather than pt's wife being asked to do this when he lives in skilled care.    Past Medical History:  Diagnosis Date  . Abnormality of gait 04/05/2015  . Benign prostatic hypertrophy   . Dementia ([place])   . Diabetes mellitus type II    no meds  . Elevated PSA   . Erectile dysfunction   . Gout   . Hereditary and idiopathic peripheral neuropathy 08/02/2016  . Hypertension   . Memory changes   . Memory difficulties 04/05/2015  . Nephrolithiasis    hx of  . Obstructive sleep apnea   . Partial Achilles tendon tear   . Rosacea, acne   . Tremor 04/05/2015   Past Surgical History:  Procedure Laterality Date  . CARPAL TUNNEL RELEASE  2004  . CARPAL TUNNEL RELEASE  10/31/2011   Procedure: CARPAL TUNNEL RELEASE;  Surgeon: Cammie Sickle., MD;  Location: [facility];  Service: Orthopedics;  Laterality: Left;  . EYE SURGERY  cataracts  . HAND SURGERY  2007   right hand  . INGUINAL HERNIA REPAIR    . JOINT REPLACEMENT     left total knee  . KNEE SURGERY  1961   left knee  . TONSILLECTOMY    . TONSILLECTOMY AND ADENOIDECTOMY    . TRANSURETHRAL RESECTION OF PROSTATE      Allergies  Allergen Reactions  . Ciprofloxacin     REACTION: Diarrhea,l rectal irritation    Outpatient Encounter Medications as of 12/17/2018  Medication Sig  . acetaminophen (TYLENOL) 500 MG tablet Take 500 mg by mouth 2 (two) times daily.  Marland Kitchen allopurinol (ZYLOPRIM) 100 MG tablet Take 100 mg by mouth daily.   Marland Kitchen amLODipine (NORVASC) 10 MG tablet  Take 10 mg by mouth daily.  Marland Kitchen aspirin 81 MG tablet Take 81 mg by mouth daily.  . bisacodyl (DULCOLAX) 10 MG suppository Place 10 mg rectally as needed for moderate constipation.  . carboxymethylcellulose (REFRESH PLUS) 0.5 % SOLN 2 drops 2 (two) times daily as needed.  . Cholecalciferol (VITAMIN D-3 PO) Take 2,000 Units by mouth daily.   . diclofenac sodium (VOLTAREN) 1 % GEL Apply 2 g topically daily as needed.  . Eyelid Cleansers (OCUSOFT EYELID CLEANSING) PADS Apply topically every morning.  . finasteride (PROSCAR) 5 MG tablet Take 5 mg by mouth at bedtime.   . hydrocortisone 2.5 % cream Apply 1 application topically as needed.  Marland Kitchen lisinopril (PRINIVIL,ZESTRIL) 40 MG tablet Take 40 mg by mouth daily.   . metFORMIN (GLUCOPHAGE) 500 MG tablet Take 500 mg by mouth daily with breakfast.  . pantoprazole (PROTONIX) 40 MG tablet Take 40 mg by mouth daily.   . sertraline (ZOLOFT) 100 MG tablet Take 100 mg by mouth at bedtime.  . [DISCONTINUED] donepezil (ARICEPT) 10 MG tablet Take 10 mg by mouth at bedtime.  . [DISCONTINUED] memantine (NAMENDA) 10 MG tablet Take 1 tablet by mouth 2 (two) times daily.   No facility-administered encounter medications on file as of 12/17/2018.     Review of Systems  Constitutional: Positive for activity change, appetite change and fatigue. Negative for chills and fever.    Immunization History  Administered Date(s) Administered  . Influenza Split 08/18/2011, 08/08/2012  . Influenza Whole 08/21/2006, 07/11/2007, 09/13/2009, 07/13/2010  . Influenza, Seasonal, Injecte, Preservative Fre 07/10/2013  . Influenza,inj,Quad PF,6+ Mos 07/30/2018  . Influenza-Unspecified 08/08/2012, 07/17/2014, 08/10/2014, 06/16/2015, 07/30/2017  . Pneumococcal Conjugate-13 01/06/2014  . Pneumococcal Polysaccharide-23 03/12/2012, 03/10/2014  . Td 10/09/1992  . Tdap 03/12/2012, 01/08/2014  . Zoster 12/26/2007, 03/10/2014  . Zoster Recombinat (Shingrix) 01/23/2018   Pertinent  Health  Maintenance Due  Topic Date Due  . HEMOGLOBIN A1C  11/17/2018  . FOOT EXAM  01/08/2019  . OPHTHALMOLOGY EXAM  04/04/2019  . INFLUENZA VACCINE  Completed  . PNA vac Low Risk Adult  Completed  . COLONOSCOPY  Discontinued   Fall Risk  04/30/2018  Falls in the past year? No   Functional Status Survey:    Vitals:   12/17/18 1517  BP: 123/73  Pulse: 79  Resp: 18  Temp: 98.4 F (36.9 C)  TempSrc: Oral  SpO2: 94%  Weight: 260 lb (117.9 kg)  Height: 6\' 3"  (1.905 m)   Body mass index is 32.5 kg/m. Physical Exam Vitals signs and nursing note reviewed.  Constitutional:      General: He is not in acute distress.    Appearance: Normal appearance. He is obese. He is not ill-appearing or toxic-appearing.  HENT:  Head: Normocephalic and atraumatic.  Cardiovascular:     Rate and Rhythm: Normal rate and regular rhythm.     Pulses: Normal pulses.     Heart sounds: Normal heart sounds.  Pulmonary:     Effort: Pulmonary effort is normal.     Breath sounds: Normal breath sounds. No rhonchi.  Abdominal:     General: Bowel sounds are normal.     Palpations: Abdomen is soft.  Musculoskeletal: Normal range of motion.        General: No tenderness.     Comments: No reported tenderness of shoulders, knees or hands on exam  Skin:    General: Skin is warm and dry.  Neurological:     Mental Status: He is alert.     Comments: Uses manual wheelchair, no new focal deficits, speech very soft and almost inaudible  Psychiatric:     Comments: Flat affect with less smiling and verbal interaction than baseline for him     Labs reviewed: Recent Labs    05/17/18 0900  NA 140  K 4.3  BUN 25*  CREATININE 1.3   No results for input(s): AST, ALT, ALKPHOS, BILITOT, PROT, ALBUMIN in the last 8760 hours. No results for input(s): WBC, NEUTROABS, HGB, HCT, MCV, PLT in the last 8760 hours. Lab Results  Component Value Date   TSH 1.14 05/17/2018   Lab Results  Component Value Date   HGBA1C  6.8 05/17/2018   Lab Results  Component Value Date   CHOL 151 11/11/2013   HDL 33.00 (L) 11/11/2013   LDLCALC 96 11/11/2013   LDLDIRECT 97.8 05/26/2009   TRIG 112.0 11/11/2013   CHOLHDL 5 11/11/2013    Significant Diagnostic Results in last 30 days:  No results found.  Assessment/Plan 1. Cognitive and behavioral changes -we are questioning if pt had a small stroke with increased dysphagia or if this is a late effect of the event he had noted in dec? OR simply progression of his dementia OR an effect of his stimulatory wellbutrin I prescribed in hopes of improving his mood and activity level -we opted to stop the wellbutrin to see if it helped his behaviors first  2. Mixed Alzheimer's and vascular dementia ([place]) -progressing, suspect that is the primary issue, but he does have new difficulty with spitting out pills and softer speech so ST will be consulted  3. Severe obstructive sleep apnea -asked nursing staff to help with getting in touch with pulmonary at [facility] for pt's sleep apnea cpap supplies as in hpi -equipment is still being used, but the usual replacement parts have been slow to receive  4. Pill dysphagia -consult ST to eval and tx  Family/ staff Communication:  Discussed with SNF nurse and his wife, [name]  Labs/tests ordered:  No new  Amauri Medellin L. Right[employer]orne, D.O. [facility] [address], [place] [address] Cell Phone (Mon-Fri 8am-5pm):  [phone] On Call:  [phone] & follow prompts after 5pm & weekends Office Phone:  [phone] Office Fax:  [phone]

## 2018-12-23 DIAGNOSIS — R482 Apraxia: Secondary | ICD-10-CM | POA: Diagnosis not present

## 2018-12-23 DIAGNOSIS — F0150 Vascular dementia without behavioral disturbance: Secondary | ICD-10-CM | POA: Diagnosis not present

## 2018-12-23 DIAGNOSIS — G309 Alzheimer's disease, unspecified: Secondary | ICD-10-CM | POA: Diagnosis not present

## 2018-12-23 DIAGNOSIS — F0280 Dementia in other diseases classified elsewhere without behavioral disturbance: Secondary | ICD-10-CM | POA: Diagnosis not present

## 2018-12-23 DIAGNOSIS — R41841 Cognitive communication deficit: Secondary | ICD-10-CM | POA: Diagnosis not present

## 2018-12-23 DIAGNOSIS — R1312 Dysphagia, oropharyngeal phase: Secondary | ICD-10-CM | POA: Diagnosis not present

## 2018-12-24 DIAGNOSIS — R41841 Cognitive communication deficit: Secondary | ICD-10-CM | POA: Diagnosis not present

## 2018-12-24 DIAGNOSIS — R482 Apraxia: Secondary | ICD-10-CM | POA: Diagnosis not present

## 2018-12-24 DIAGNOSIS — R1312 Dysphagia, oropharyngeal phase: Secondary | ICD-10-CM | POA: Diagnosis not present

## 2018-12-24 DIAGNOSIS — F0280 Dementia in other diseases classified elsewhere without behavioral disturbance: Secondary | ICD-10-CM | POA: Diagnosis not present

## 2018-12-24 DIAGNOSIS — G309 Alzheimer's disease, unspecified: Secondary | ICD-10-CM | POA: Diagnosis not present

## 2018-12-24 DIAGNOSIS — F0150 Vascular dementia without behavioral disturbance: Secondary | ICD-10-CM | POA: Diagnosis not present

## 2018-12-30 DIAGNOSIS — F0280 Dementia in other diseases classified elsewhere without behavioral disturbance: Secondary | ICD-10-CM | POA: Diagnosis not present

## 2018-12-30 DIAGNOSIS — R41841 Cognitive communication deficit: Secondary | ICD-10-CM | POA: Diagnosis not present

## 2018-12-30 DIAGNOSIS — R482 Apraxia: Secondary | ICD-10-CM | POA: Diagnosis not present

## 2018-12-30 DIAGNOSIS — F0150 Vascular dementia without behavioral disturbance: Secondary | ICD-10-CM | POA: Diagnosis not present

## 2018-12-30 DIAGNOSIS — R1312 Dysphagia, oropharyngeal phase: Secondary | ICD-10-CM | POA: Diagnosis not present

## 2018-12-30 DIAGNOSIS — G309 Alzheimer's disease, unspecified: Secondary | ICD-10-CM | POA: Diagnosis not present

## 2019-01-02 DIAGNOSIS — R41841 Cognitive communication deficit: Secondary | ICD-10-CM | POA: Diagnosis not present

## 2019-01-02 DIAGNOSIS — R482 Apraxia: Secondary | ICD-10-CM | POA: Diagnosis not present

## 2019-01-02 DIAGNOSIS — F0150 Vascular dementia without behavioral disturbance: Secondary | ICD-10-CM | POA: Diagnosis not present

## 2019-01-02 DIAGNOSIS — G309 Alzheimer's disease, unspecified: Secondary | ICD-10-CM | POA: Diagnosis not present

## 2019-01-02 DIAGNOSIS — R1312 Dysphagia, oropharyngeal phase: Secondary | ICD-10-CM | POA: Diagnosis not present

## 2019-01-02 DIAGNOSIS — F0280 Dementia in other diseases classified elsewhere without behavioral disturbance: Secondary | ICD-10-CM | POA: Diagnosis not present

## 2019-01-06 DIAGNOSIS — R1312 Dysphagia, oropharyngeal phase: Secondary | ICD-10-CM | POA: Diagnosis not present

## 2019-01-06 DIAGNOSIS — R41841 Cognitive communication deficit: Secondary | ICD-10-CM | POA: Diagnosis not present

## 2019-01-06 DIAGNOSIS — F0150 Vascular dementia without behavioral disturbance: Secondary | ICD-10-CM | POA: Diagnosis not present

## 2019-01-06 DIAGNOSIS — F0280 Dementia in other diseases classified elsewhere without behavioral disturbance: Secondary | ICD-10-CM | POA: Diagnosis not present

## 2019-01-06 DIAGNOSIS — G309 Alzheimer's disease, unspecified: Secondary | ICD-10-CM | POA: Diagnosis not present

## 2019-01-06 DIAGNOSIS — R482 Apraxia: Secondary | ICD-10-CM | POA: Diagnosis not present

## 2019-01-08 DIAGNOSIS — R1312 Dysphagia, oropharyngeal phase: Secondary | ICD-10-CM | POA: Diagnosis not present

## 2019-01-08 DIAGNOSIS — R41841 Cognitive communication deficit: Secondary | ICD-10-CM | POA: Diagnosis not present

## 2019-01-08 DIAGNOSIS — R482 Apraxia: Secondary | ICD-10-CM | POA: Diagnosis not present

## 2019-01-08 DIAGNOSIS — G309 Alzheimer's disease, unspecified: Secondary | ICD-10-CM | POA: Diagnosis not present

## 2019-01-08 DIAGNOSIS — F0280 Dementia in other diseases classified elsewhere without behavioral disturbance: Secondary | ICD-10-CM | POA: Diagnosis not present

## 2019-01-08 DIAGNOSIS — F0150 Vascular dementia without behavioral disturbance: Secondary | ICD-10-CM | POA: Diagnosis not present

## 2019-01-10 ENCOUNTER — Non-Acute Institutional Stay (SKILLED_NURSING_FACILITY): Payer: Medicare Other | Admitting: Adult Health

## 2019-01-10 ENCOUNTER — Encounter: Payer: Self-pay | Admitting: Adult Health

## 2019-01-10 DIAGNOSIS — R1312 Dysphagia, oropharyngeal phase: Secondary | ICD-10-CM | POA: Diagnosis not present

## 2019-01-10 DIAGNOSIS — N183 Chronic kidney disease, stage 3 unspecified: Secondary | ICD-10-CM

## 2019-01-10 DIAGNOSIS — I129 Hypertensive chronic kidney disease with stage 1 through stage 4 chronic kidney disease, or unspecified chronic kidney disease: Secondary | ICD-10-CM | POA: Diagnosis not present

## 2019-01-10 DIAGNOSIS — E119 Type 2 diabetes mellitus without complications: Secondary | ICD-10-CM

## 2019-01-10 DIAGNOSIS — F0280 Dementia in other diseases classified elsewhere without behavioral disturbance: Secondary | ICD-10-CM

## 2019-01-10 DIAGNOSIS — G4733 Obstructive sleep apnea (adult) (pediatric): Secondary | ICD-10-CM

## 2019-01-10 DIAGNOSIS — F0150 Vascular dementia without behavioral disturbance: Secondary | ICD-10-CM

## 2019-01-10 DIAGNOSIS — R41841 Cognitive communication deficit: Secondary | ICD-10-CM | POA: Diagnosis not present

## 2019-01-10 DIAGNOSIS — F329 Major depressive disorder, single episode, unspecified: Secondary | ICD-10-CM

## 2019-01-10 DIAGNOSIS — R482 Apraxia: Secondary | ICD-10-CM | POA: Diagnosis not present

## 2019-01-10 DIAGNOSIS — K5901 Slow transit constipation: Secondary | ICD-10-CM

## 2019-01-10 DIAGNOSIS — G309 Alzheimer's disease, unspecified: Secondary | ICD-10-CM | POA: Diagnosis not present

## 2019-01-10 NOTE — Progress Notes (Signed)
Location:  Occupational psychologist of Service:  SNF (31) Provider:   Cindi Carbon, ANP [facility] [phone]   Gayland Curry, DO  Patient Care Team: Gayland Curry, DO as PCP - General (Geriatric Medicine)  Extended Emergency Contact Information Primary Emergency Contact: Schweers,[name] Address: [address]          [place], [place] [address] [name] of [place] Phone: [phone] Mobile Phone: [phone] Relation: Spouse  Code Status:  DNR Goals of care: Advanced Directive information Advanced Directives 12/17/2018  Does Patient Have a Medical Advance Directive? Yes  Type of Paramedic of [place];Living will;Out of facility DNR (pink MOST or yellow form)  Does patient want to make changes to medical advance directive? No - Patient declined  Copy of [place] in Chart? Yes - validated most recent copy scanned in chart (See row information)  Would patient like information on creating a medical advance directive? -  Pre-existing out of facility DNR order (yellow form or pink MOST form) Yellow form placed in chart (order not valid for inpatient use)     Chief Complaint  Patient presents with  . Medical Management of Chronic Issues    HPI:  Pt is a 78 y.o. male seen today for medical management of chronic diseases.   Nurse reports reduced periods of agitation on day shift. He is sleeping more and has lost 9 lbs in the last month due to decreased intake. Appetite has been reduced and he is not wanting to open his mouth for feeding at times. He now requires assistance for meals. He is more aphasic per staff. He was hoarse at his last visit with Dr. Mariea Clonts but this has improved per the nurse. He is periodically using the cpap machine but at times he removes it himself. He was not given new orders or new equipment when the staff called last month. He has been seen by speech and they did not find  evidence of aspiration. His diet was downgraded to D 3 with thin liquid.   Functional status: hoyer lift, incontinent.   Past Medical History:  Diagnosis Date  . Abnormality of gait 04/05/2015  . Benign prostatic hypertrophy   . Dementia ([place])   . Diabetes mellitus type II    no meds  . Elevated PSA   . Erectile dysfunction   . Gout   . Hereditary and idiopathic peripheral neuropathy 08/02/2016  . Hypertension   . Memory changes   . Memory difficulties 04/05/2015  . Nephrolithiasis    hx of  . Obstructive sleep apnea   . Partial Achilles tendon tear   . Rosacea, acne   . Tremor 04/05/2015   Past Surgical History:  Procedure Laterality Date  . CARPAL TUNNEL RELEASE  2004  . CARPAL TUNNEL RELEASE  10/31/2011   Procedure: CARPAL TUNNEL RELEASE;  Surgeon: Cammie Sickle., MD;  Location: [facility];  Service: Orthopedics;  Laterality: Left;  . EYE SURGERY     cataracts  . HAND SURGERY  2007   right hand  . INGUINAL HERNIA REPAIR    . JOINT REPLACEMENT     left total knee  . KNEE SURGERY  1961   left knee  . TONSILLECTOMY    . TONSILLECTOMY AND ADENOIDECTOMY    . TRANSURETHRAL RESECTION OF PROSTATE      Allergies  Allergen Reactions  . Ciprofloxacin     REACTION: Diarrhea,l rectal  irritation    Outpatient Encounter Medications as of 01/10/2019  Medication Sig  . amLODipine (NORVASC) 10 MG tablet Take 10 mg by mouth daily.  Marland Kitchen aspirin 81 MG tablet Take 81 mg by mouth daily.  . carboxymethylcellulose (REFRESH PLUS) 0.5 % SOLN 2 drops 2 (two) times daily as needed.  . Cholecalciferol (VITAMIN D-3 PO) Take 2,000 Units by mouth daily.   . diclofenac sodium (VOLTAREN) 1 % GEL Apply 2 g topically daily as needed.  . Eyelid Cleansers (OCUSOFT EYELID CLEANSING) PADS Apply topically every morning.  . finasteride (PROSCAR) 5 MG tablet Take 5 mg by mouth at bedtime.   . hydrocortisone 2.5 % cream Apply 1 application topically as needed.  Marland Kitchen lisinopril  (PRINIVIL,ZESTRIL) 40 MG tablet Take 40 mg by mouth daily.   . metFORMIN (GLUCOPHAGE) 500 MG tablet Take 500 mg by mouth daily with breakfast.  . pantoprazole (PROTONIX) 40 MG tablet Take 40 mg by mouth daily.   . sertraline (ZOLOFT) 100 MG tablet Take 100 mg by mouth at bedtime.  Marland Kitchen acetaminophen (TYLENOL) 500 MG tablet Take 500 mg by mouth 2 (two) times daily.  Marland Kitchen allopurinol (ZYLOPRIM) 100 MG tablet Take 100 mg by mouth daily.   . [DISCONTINUED] bisacodyl (DULCOLAX) 10 MG suppository Place 10 mg rectally as needed for moderate constipation.   No facility-administered encounter medications on file as of 01/10/2019.     Review of Systems  Unable to perform ROS: Dementia    Immunization History  Administered Date(s) Administered  . Influenza Split 08/18/2011, 08/08/2012  . Influenza Whole 08/21/2006, 07/11/2007, 09/13/2009, 07/13/2010  . Influenza, Seasonal, Injecte, Preservative Fre 07/10/2013  . Influenza,inj,Quad PF,6+ Mos 07/30/2018  . Influenza-Unspecified 08/08/2012, 07/17/2014, 08/10/2014, 06/16/2015, 07/30/2017  . Pneumococcal Conjugate-13 01/06/2014  . Pneumococcal Polysaccharide-23 03/12/2012, 03/10/2014  . Td 10/09/1992  . Tdap 03/12/2012, 01/08/2014  . Zoster 12/26/2007, 03/10/2014  . Zoster Recombinat (Shingrix) 01/23/2018   Pertinent  Health Maintenance Due  Topic Date Due  . HEMOGLOBIN A1C  11/17/2018  . FOOT EXAM  01/08/2019  . OPHTHALMOLOGY EXAM  04/04/2019  . INFLUENZA VACCINE  05/10/2019  . PNA vac Low Risk Adult  Completed  . COLONOSCOPY  Discontinued   Fall Risk  04/30/2018  Falls in the past year? No   Functional Status Survey:    Vitals:   01/10/19 1009  Weight: 251 lb 1.6 oz (113.9 kg)   Body mass index is 31.39 kg/m. Physical Exam Vitals signs and nursing note reviewed.  Constitutional:      General: He is not in acute distress. HENT:     Head: Normocephalic and atraumatic.  Neck:     Musculoskeletal: No muscular tenderness.   Cardiovascular:     Rate and Rhythm: Normal rate.     Heart sounds: No murmur.  Pulmonary:     Effort: Pulmonary effort is normal. No respiratory distress.     Breath sounds: Normal breath sounds. No wheezing.  Abdominal:     General: Abdomen is flat. Bowel sounds are normal. There is no distension.     Palpations: Abdomen is soft.  Musculoskeletal:        General: No swelling or deformity.     Right lower leg: No edema.     Left lower leg: No edema.  Lymphadenopathy:     Cervical: No cervical adenopathy.  Skin:    General: Skin is warm and dry.  Neurological:     General: No focal deficit present.     Mental Status: He  is alert. Mental status is at baseline.     Cranial Nerves: No cranial nerve deficit.     Comments: Generally weak and had difficulty following commands for neuro exam. No facial droop.  Psychiatric:        Mood and Affect: Mood normal.     Labs reviewed: Recent Labs    05/17/18 0900 11/22/18  NA 140 140  K 4.3 4.1  BUN 25* 24*  CREATININE 1.3 1.5*   No results for input(s): AST, ALT, ALKPHOS, BILITOT, PROT, ALBUMIN in the last 8760 hours. Recent Labs    11/22/18  WBC 7.0  HGB 11.5*  HCT 35*  PLT 191   Lab Results  Component Value Date   TSH 1.74 11/22/2018   Lab Results  Component Value Date   HGBA1C 6.6 11/22/2018   Lab Results  Component Value Date   CHOL 151 11/11/2013   HDL 33.00 (L) 11/11/2013   LDLCALC 96 11/11/2013   LDLDIRECT 97.8 05/26/2009   TRIG 112.0 11/11/2013   CHOLHDL 5 11/11/2013    Significant Diagnostic Results in last 30 days:  No results found.  Assessment/Plan  1. Mixed Alzheimer's and vascular dementia ([place]) His dementia appears to be progressing. He has also had a TIA (Dec of 2019) and possibly more recently a CVA which led to his decline. This is difficult to assess given the advanced nature of his underlying dementia. He is now off memory meds due to a lack of benefit. His wife is aware of theses changes.  We will continue to provide supportive care in the facility. He is a DNR.  2. Severe obstructive sleep apnea Continue cpap machine qhs  3. Benign hypertension with chronic kidney disease, stage III (HCC) Controlled Lab Results  Component Value Date   BUN 24 (A) 11/22/2018   Lab Results  Component Value Date   CREATININE 1.5 (A) 11/22/2018    4. Constipation by delayed colonic transit No reported issues with bowel movements  5. Diabetes mellitus type 2 without retinopathy ([place]) Controlled with glucophage Lab Results  Component Value Date   HGBA1C 6.6 11/22/2018  Eye exam due in June  6. Chronic kidney disease, stage III (moderate) (HCC) Continue to periodically monitor BMP and avoid nephrotoxic agents  7. Reactive depression Improved mood off wellbutrin. Continue Zoloft 100 mg qhs   Family/ staff Communication: discussed with nurse, record review Labs/tests ordered: NA

## 2019-02-07 ENCOUNTER — Non-Acute Institutional Stay (SKILLED_NURSING_FACILITY): Payer: Medicare Other | Admitting: Adult Health

## 2019-02-07 DIAGNOSIS — I129 Hypertensive chronic kidney disease with stage 1 through stage 4 chronic kidney disease, or unspecified chronic kidney disease: Secondary | ICD-10-CM | POA: Diagnosis not present

## 2019-02-07 DIAGNOSIS — G309 Alzheimer's disease, unspecified: Secondary | ICD-10-CM | POA: Diagnosis not present

## 2019-02-07 DIAGNOSIS — N200 Calculus of kidney: Secondary | ICD-10-CM

## 2019-02-07 DIAGNOSIS — F0150 Vascular dementia without behavioral disturbance: Secondary | ICD-10-CM

## 2019-02-07 DIAGNOSIS — N183 Chronic kidney disease, stage 3 unspecified: Secondary | ICD-10-CM

## 2019-02-07 DIAGNOSIS — N401 Enlarged prostate with lower urinary tract symptoms: Secondary | ICD-10-CM

## 2019-02-07 DIAGNOSIS — E119 Type 2 diabetes mellitus without complications: Secondary | ICD-10-CM

## 2019-02-07 DIAGNOSIS — F0280 Dementia in other diseases classified elsewhere without behavioral disturbance: Secondary | ICD-10-CM

## 2019-02-11 ENCOUNTER — Encounter: Payer: Self-pay | Admitting: Adult Health

## 2019-02-11 ENCOUNTER — Other Ambulatory Visit: Payer: Self-pay

## 2019-02-11 NOTE — Progress Notes (Signed)
Location:  [facility] of Service:  SNF (31) Provider:   Cindi Carbon, ANP [place] [phone]  Gayland Curry, DO  Patient Care Team: Gayland Curry, DO as PCP - General (Geriatric Medicine)  Extended Emergency Contact Information Primary Emergency Contact: Horst,[name] Address: [address]          [place], [place] [address] [name] of [place] Phone: [phone] Mobile Phone: [phone] Relation: Spouse  Code Status:  DNR Goals of care: Advanced Directive information Advanced Directives 12/17/2018  Does Patient Have a Medical Advance Directive? Yes  Type of Paramedic of [place];Living will;Out of facility DNR (pink MOST or yellow form)  Does patient want to make changes to medical advance directive? No - Patient declined  Copy of [place] in Chart? Yes - validated most recent copy scanned in chart (See row information)  Would patient like information on creating a medical advance directive? -  Pre-existing out of facility DNR order (yellow form or pink MOST form) Yellow form placed in chart (order not valid for inpatient use)     Chief Complaint  Patient presents with  . Medical Management of Chronic Issues    HPI:  Pt is a 78 y.o. male seen today for medical management of chronic diseases.   He has a hx of Mixed AD and vascular dementia with a hx of head injury associated with playing foot ball in college. He is minimally verbal at this point. The staff report he is less interactive over time. Nsg notes reviewed in matrix report one incidence of resistance to care. The nurse on the floor reports this is not common. He has lost 9 lbs since March of 2020.  BP 048-889 systolic  CBGs range 169-450 fasting  Hx of BPH and nephrolithiasis No reports of issues urinating or back pain  Functional status: hoyer lift, incontinent   Past Medical History:  Diagnosis  Date  . Abnormality of gait 04/05/2015  . Benign prostatic hypertrophy   . Dementia ([place])   . Diabetes mellitus type II    no meds  . Elevated PSA   . Erectile dysfunction   . Gout   . Hereditary and idiopathic peripheral neuropathy 08/02/2016  . Hypertension   . Memory changes   . Memory difficulties 04/05/2015  . Nephrolithiasis    hx of  . Obstructive sleep apnea   . Partial Achilles tendon tear   . Rosacea, acne   . Tremor 04/05/2015   Past Surgical History:  Procedure Laterality Date  . CARPAL TUNNEL RELEASE  2004  . CARPAL TUNNEL RELEASE  10/31/2011   Procedure: CARPAL TUNNEL RELEASE;  Surgeon: Cammie Sickle., MD;  Location: [facility];  Service: Orthopedics;  Laterality: Left;  . EYE SURGERY     cataracts  . HAND SURGERY  2007   right hand  . INGUINAL HERNIA REPAIR    . JOINT REPLACEMENT     left total knee  . KNEE SURGERY  1961   left knee  . TONSILLECTOMY    . TONSILLECTOMY AND ADENOIDECTOMY    . TRANSURETHRAL RESECTION OF PROSTATE      Allergies  Allergen Reactions  . Ciprofloxacin     REACTION: Diarrhea,l rectal irritation    Outpatient Encounter Medications as of 02/07/2019  Medication Sig  . aspirin 81 MG tablet Take 81 mg by mouth daily.  . carboxymethylcellulose (REFRESH PLUS) 0.5 % SOLN 2  drops 2 (two) times daily as needed.  . Cholecalciferol (VITAMIN D-3 PO) Take 2,000 Units by mouth daily.   . diclofenac sodium (VOLTAREN) 1 % GEL Apply 2 g topically daily as needed.  . Eyelid Cleansers (OCUSOFT EYELID CLEANSING) PADS Apply topically every morning.  . finasteride (PROSCAR) 5 MG tablet Take 5 mg by mouth at bedtime.   . hydrocortisone 2.5 % cream Apply 1 application topically as needed.  Marland Kitchen lisinopril (PRINIVIL,ZESTRIL) 40 MG tablet Take 40 mg by mouth daily.   . metFORMIN (GLUCOPHAGE) 500 MG tablet Take 500 mg by mouth daily with breakfast.  . pantoprazole (PROTONIX) 40 MG tablet Take 40 mg by mouth daily.   . sertraline (ZOLOFT)  100 MG tablet Take 100 mg by mouth at bedtime.  Marland Kitchen acetaminophen (TYLENOL) 500 MG tablet Take 500 mg by mouth 2 (two) times daily.  Marland Kitchen allopurinol (ZYLOPRIM) 100 MG tablet Take 100 mg by mouth daily.   Marland Kitchen amLODipine (NORVASC) 10 MG tablet Take 10 mg by mouth daily.   No facility-administered encounter medications on file as of 02/07/2019.     Review of Systems  Unable to perform ROS: Dementia    Immunization History  Administered Date(s) Administered  . Influenza Split 08/18/2011, 08/08/2012  . Influenza Whole 08/21/2006, 07/11/2007, 09/13/2009, 07/13/2010  . Influenza, Seasonal, Injecte, Preservative Fre 07/10/2013  . Influenza,inj,Quad PF,6+ Mos 07/30/2018  . Influenza-Unspecified 08/08/2012, 07/17/2014, 08/10/2014, 06/16/2015, 07/30/2017  . Pneumococcal Conjugate-13 01/06/2014  . Pneumococcal Polysaccharide-23 03/12/2012, 03/10/2014  . Td 10/09/1992  . Tdap 03/12/2012, 01/08/2014  . Zoster 12/26/2007, 03/10/2014  . Zoster Recombinat (Shingrix) 01/23/2018   Pertinent  Health Maintenance Due  Topic Date Due  . FOOT EXAM  01/08/2019  . OPHTHALMOLOGY EXAM  04/04/2019  . INFLUENZA VACCINE  05/10/2019  . HEMOGLOBIN A1C  05/23/2019  . PNA vac Low Risk Adult  Completed  . COLONOSCOPY  Discontinued   Fall Risk  01/10/2019 04/30/2018  Falls in the past year? 0 No  Number falls in past yr: 0 -  Injury with Fall? 0 -  Risk for fall due to : Impaired balance/gait;Mental status change -  Follow up Falls evaluation completed -   Functional Status Survey:    Vitals:   02/11/19 0946  Weight: 251 lb 1.6 oz (113.9 kg)   Body mass index is 31.39 kg/m.  Wt Readings from Last 3 Encounters:  02/11/19 251 lb 1.6 oz (113.9 kg)  01/10/19 251 lb 1.6 oz (113.9 kg)  12/17/18 260 lb (117.9 kg)    Physical Exam Vitals signs and nursing note reviewed.  Constitutional:      Appearance: Normal appearance.  Pulmonary:     Effort: Pulmonary effort is normal. No respiratory distress.   Neurological:     Mental Status: He is alert. Mental status is at baseline.     Comments: Not verbal, not able to f/c  Psychiatric:     Comments: flat     Labs reviewed: Recent Labs    05/17/18 0900 11/22/18  NA 140 140  K 4.3 4.1  BUN 25* 24*  CREATININE 1.3 1.5*   No results for input(s): AST, ALT, ALKPHOS, BILITOT, PROT, ALBUMIN in the last 8760 hours. Recent Labs    11/22/18  WBC 7.0  HGB 11.5*  HCT 35*  PLT 191   Lab Results  Component Value Date   TSH 1.74 11/22/2018   Lab Results  Component Value Date   HGBA1C 6.6 11/22/2018   Lab Results  Component Value Date  CHOL 151 11/11/2013   HDL 33.00 (L) 11/11/2013   LDLCALC 96 11/11/2013   LDLDIRECT 97.8 05/26/2009   TRIG 112.0 11/11/2013   CHOLHDL 5 11/11/2013    Significant Diagnostic Results in last 30 days:  No results found.  Assessment/Plan 1. Mixed Alzheimer's and vascular dementia ([place]) Progressively declining with less verbalization and interaction.  Continue supportive care in the skilled unit.   2. Diabetes mellitus type 2 without retinopathy ([place]) Lab Results  Component Value Date   HGBA1C 6.6 11/22/2018  Continue metformin 500 mg qd Controlled Monitor A1C q 6 months Eye exam annually  3. Chronic kidney disease, stage III (moderate) (HCC) Continue to periodically monitor BMP and avoid nephrotoxic agents  4. Recurrent nephrolithiasis No new issues  5. Benign non-nodular prostatic hyperplasia with lower urinary tract symptoms No symptoms reported although he is incontinent so it is difficult evaluate.Would continue Proscar 5 mg qd;  6. Benign hypertension with chronic kidney disease, stage III (HCC) Controlled  Continue lisinopril 40 mg qd and Norvasc 10 mg qd    Family/ staff Communication: staff   Labs/tests ordered:  NA

## 2019-03-04 ENCOUNTER — Non-Acute Institutional Stay (SKILLED_NURSING_FACILITY): Payer: Medicare Other | Admitting: Internal Medicine

## 2019-03-04 ENCOUNTER — Encounter: Payer: Self-pay | Admitting: Internal Medicine

## 2019-03-04 DIAGNOSIS — L711 Rhinophyma: Secondary | ICD-10-CM | POA: Diagnosis not present

## 2019-03-04 DIAGNOSIS — L738 Other specified follicular disorders: Secondary | ICD-10-CM

## 2019-03-04 NOTE — Progress Notes (Signed)
Patient ID: [name], male   DOB: 1941/10/04, 78 y.o.   MRN: [id number]  Location:  [facility] Room Number: 120 Place of Service:  SNF [phone]) Provider:   Gayland Curry, DO  Patient Care Team: Gayland Curry, DO as PCP - General (Geriatric Medicine)  Extended Emergency Contact Information Primary Emergency Contact: Hertzberg,[name] Address: [address]          [place], [place] [address] [name] of [place] Phone: [phone] Mobile Phone: [phone] Relation: Spouse  Code Status:  DNR Goals of care: Advanced Directive information Advanced Directives 12/17/2018  Does Patient Have a Medical Advance Directive? Yes  Type of Paramedic of [place];Living will;Out of facility DNR (pink MOST or yellow form)  Does patient want to make changes to medical advance directive? No - Patient declined  Copy of [place] in Chart? Yes - validated most recent copy scanned in chart (See row information)  Would patient like information on creating a medical advance directive? -  Pre-existing out of facility DNR order (yellow form or pink MOST form) Yellow form placed in chart (order not valid for inpatient use)     Chief Complaint  Patient presents with  . Acute Visit    drainage from nevi on forehead and chest    HPI:  Pt is a 78 y.o. male skilled nursing resident with h/o dementia, constipation, severe OSA, hypertension, ckd, DMII with neuropathy, cervical spondylosis, lumbar spinal stenosis, BPH with LUTS and OA of knees seen today for an acute visit for possibly infected nevi on his forehead and chest.  These were noted 5/24 to be draining pus.  He has chronically had multiple umbilicated flesh-colored papules on his nose, forehead and some on his upper chest.  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  He has prn hydrocortisone cream, but no other topicals for dermatologic conditions.  There was concern for  infection.  Current nurse and I saw patient and it appears that any drainage has resolved.  Pt denies pain and appears comfortable  There is no redness, warmth or drainage of his forehead or chest.     Past Medical History:  Diagnosis Date  . Abnormality of gait 04/05/2015  . Benign prostatic hypertrophy   . Dementia ([place])   . Diabetes mellitus type II    no meds  . Elevated PSA   . Erectile dysfunction   . Gout   . Hereditary and idiopathic peripheral neuropathy 08/02/2016  . Hypertension   . Memory changes   . Memory difficulties 04/05/2015  . Nephrolithiasis    hx of  . Obstructive sleep apnea   . Partial Achilles tendon tear   . Rosacea, acne   . Tremor 04/05/2015   Past Surgical History:  Procedure Laterality Date  . CARPAL TUNNEL RELEASE  2004  . CARPAL TUNNEL RELEASE  10/31/2011   Procedure: CARPAL TUNNEL RELEASE;  Surgeon: Cammie Sickle., MD;  Location: [facility];  Service: Orthopedics;  Laterality: Left;  . EYE SURGERY     cataracts  . HAND SURGERY  2007   right hand  . INGUINAL HERNIA REPAIR    . JOINT REPLACEMENT     left total knee  . KNEE SURGERY  1961   left knee  . TONSILLECTOMY    . TONSILLECTOMY AND ADENOIDECTOMY    . TRANSURETHRAL RESECTION OF PROSTATE      Allergies  Allergen Reactions  . Ciprofloxacin  REACTION: Diarrhea,l rectal irritation    Outpatient Encounter Medications as of 03/04/2019  Medication Sig  . acetaminophen (TYLENOL) 500 MG tablet Take 500 mg by mouth 2 (two) times daily.  Marland Kitchen allopurinol (ZYLOPRIM) 100 MG tablet Take 100 mg by mouth daily.   Marland Kitchen amLODipine (NORVASC) 10 MG tablet Take 10 mg by mouth daily.  Marland Kitchen aspirin 81 MG tablet Take 81 mg by mouth daily.  . carboxymethylcellulose (REFRESH PLUS) 0.5 % SOLN 2 drops 2 (two) times daily as needed.  . Cholecalciferol (VITAMIN D-3 PO) Take 2,000 Units by mouth daily.   . diclofenac sodium (VOLTAREN) 1 % GEL Apply 2 g topically daily as needed.  . Eyelid  Cleansers (OCUSOFT EYELID CLEANSING) PADS Apply topically every morning.  . finasteride (PROSCAR) 5 MG tablet Take 5 mg by mouth at bedtime.   . hydrocortisone 2.5 % cream Apply 1 application topically as needed.  Marland Kitchen lisinopril (PRINIVIL,ZESTRIL) 40 MG tablet Take 40 mg by mouth daily.   . metFORMIN (GLUCOPHAGE) 500 MG tablet Take 500 mg by mouth daily with breakfast.  . pantoprazole (PROTONIX) 40 MG tablet Take 40 mg by mouth daily.   . sertraline (ZOLOFT) 100 MG tablet Take 100 mg by mouth at bedtime.   No facility-administered encounter medications on file as of 03/04/2019.     Review of Systems  Constitutional: Negative for chills and fever.  Respiratory: Negative for cough and shortness of breath.   Skin:       Umbilicated papules flesh-colored on forehead, nose  + rhinophyma, upper chest, cheeks some also; no visible erythema, warmth or drainage  Psychiatric/Behavioral: Positive for confusion. Negative for agitation and behavioral problems.       Very pleasant during visit    Immunization History  Administered Date(s) Administered  . Influenza Split 08/18/2011, 08/08/2012  . Influenza Whole 08/21/2006, 07/11/2007, 09/13/2009, 07/13/2010  . Influenza, Seasonal, Injecte, Preservative Fre 07/10/2013  . Influenza,inj,Quad PF,6+ Mos 07/30/2018  . Influenza-Unspecified 08/08/2012, 07/17/2014, 08/10/2014, 06/16/2015, 07/30/2017  . Pneumococcal Conjugate-13 01/06/2014  . Pneumococcal Polysaccharide-23 03/12/2012, 03/10/2014  . Td 10/09/1992  . Tdap 03/12/2012, 01/08/2014  . Zoster 12/26/2007, 03/10/2014  . Zoster Recombinat (Shingrix) 01/23/2018   Pertinent  Health Maintenance Due  Topic Date Due  . FOOT EXAM  01/08/2019  . OPHTHALMOLOGY EXAM  04/04/2019  . INFLUENZA VACCINE  05/10/2019  . HEMOGLOBIN A1C  05/23/2019  . PNA vac Low Risk Adult  Completed  . COLONOSCOPY  Discontinued   Fall Risk  01/10/2019 04/30/2018  Falls in the past year? 0 No  Number falls in past yr: 0 -    Injury with Fall? 0 -  Risk for fall due to : Impaired balance/gait;Mental status change -  Follow up Falls evaluation completed -   Functional Status Survey:    Vitals:   03/04/19 1139  BP: 118/69  Pulse: (!) 59  Resp: 16  Temp: (!) 97 F (36.1 C)  TempSrc: Oral  SpO2: 97%  Weight: 249 lb (112.9 kg)  Height: 6\' 3"  (1.905 m)   Body mass index is 31.12 kg/m. Physical Exam Vitals signs reviewed.  Constitutional:      Appearance: Normal appearance.  HENT:     Head: Normocephalic and atraumatic.  Skin:    General: Skin is warm and dry.     Comments: As in ROS  Neurological:     Mental Status: He is alert.     Labs reviewed: Recent Labs    05/17/18 0900 11/22/18  NA 140 140  K 4.3 4.1  BUN 25* 24*  CREATININE 1.3 1.5*   No results for input(s): AST, ALT, ALKPHOS, BILITOT, PROT, ALBUMIN in the last 8760 hours. Recent Labs    11/22/18  WBC 7.0  HGB 11.5*  HCT 35*  PLT 191   Lab Results  Component Value Date   TSH 1.74 11/22/2018   Lab Results  Component Value Date   HGBA1C 6.6 11/22/2018   Lab Results  Component Value Date   CHOL 151 11/11/2013   HDL 33.00 (L) 11/11/2013   LDLCALC 96 11/11/2013   LDLDIRECT 97.8 05/26/2009   TRIG 112.0 11/11/2013   CHOLHDL 5 11/11/2013    Significant Diagnostic Results in last 30 days:  No results found.  Assessment/Plan 1. Sebaceous hyperplasia of face -no new tx needed, has been chronic condition -no current signs of irritation or infection  2. Sebaceous hyperplasia of chest -also appears benign  3. 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 Marvelle Caudill, D.O. [facility] [address], [place] [address] Cell Phone (Mon-Fri 8am-5pm):  [phone] On Call:  [phone] & follow prompts after 5pm & weekends Office Phone:   [phone] Office Fax:  [phone]

## 2019-03-20 DIAGNOSIS — Z20828 Contact with and (suspected) exposure to other viral communicable diseases: Secondary | ICD-10-CM | POA: Diagnosis not present

## 2019-03-25 ENCOUNTER — Non-Acute Institutional Stay (SKILLED_NURSING_FACILITY): Payer: Medicare Other | Admitting: Internal Medicine

## 2019-03-25 ENCOUNTER — Encounter: Payer: Self-pay | Admitting: Internal Medicine

## 2019-03-25 DIAGNOSIS — G309 Alzheimer's disease, unspecified: Secondary | ICD-10-CM

## 2019-03-25 DIAGNOSIS — E119 Type 2 diabetes mellitus without complications: Secondary | ICD-10-CM

## 2019-03-25 DIAGNOSIS — I129 Hypertensive chronic kidney disease with stage 1 through stage 4 chronic kidney disease, or unspecified chronic kidney disease: Secondary | ICD-10-CM | POA: Diagnosis not present

## 2019-03-25 DIAGNOSIS — G4733 Obstructive sleep apnea (adult) (pediatric): Secondary | ICD-10-CM

## 2019-03-25 DIAGNOSIS — N183 Chronic kidney disease, stage 3 unspecified: Secondary | ICD-10-CM

## 2019-03-25 DIAGNOSIS — F0280 Dementia in other diseases classified elsewhere without behavioral disturbance: Secondary | ICD-10-CM | POA: Diagnosis not present

## 2019-03-25 DIAGNOSIS — M48061 Spinal stenosis, lumbar region without neurogenic claudication: Secondary | ICD-10-CM

## 2019-03-25 DIAGNOSIS — F0150 Vascular dementia without behavioral disturbance: Secondary | ICD-10-CM | POA: Diagnosis not present

## 2019-03-25 NOTE — Progress Notes (Signed)
Patient ID: [name], male   DOB: 1940/10/31, 78 y.o.   MRN: [id number]  Location:  [facility] Room Number: 120 Place of Service:  SNF ([phone]) Provider:   Gayland Curry, DO  Patient Care Team: Gayland Curry, DO as PCP - General (Geriatric Medicine)  Extended Emergency Contact Information Primary Emergency Contact: Sopko,[name] Address: [address]          [place], [place] [address] [name] of [place] Phone: [phone] Mobile Phone: [phone] Relation: Spouse  Code Status:  DNR Goals of care: Advanced Directive information Advanced Directives 12/17/2018  Does Patient Have a Medical Advance Directive? Yes  Type of Paramedic of [place];Living will;Out of facility DNR (pink MOST or yellow form)  Does patient want to make changes to medical advance directive? No - Patient declined  Copy of [place] in Chart? Yes - validated most recent copy scanned in chart (See row information)  Would patient like information on creating a medical advance directive? -  Pre-existing out of facility DNR order (yellow form or pink MOST form) Yellow form placed in chart (order not valid for inpatient use)     Chief Complaint  Patient presents with  . Medical Management of Chronic Issues    Routine Visit    HPI:  Pt is a 78 y.o. male seen today for medical management of chronic diseases.    Ed has not had any recent changes per nursing staff.  When I saw him, he did not speak at all today.  He did make eye contact and smile at me.  Staff note no increase in pain.  His weight is now stable (he'd been losing temporarily).  He was eating well at lunch when I saw him.  He continues with his cpap for his OSA.    DMII:  Has been controlled well with metformin, on baby asa and ace. Lab Results  Component Value Date   HGBA1C 6.6 11/22/2018   Past Medical History:  Diagnosis Date  . Abnormality of  gait 04/05/2015  . Benign prostatic hypertrophy   . Dementia ([place])   . Diabetes mellitus type II    no meds  . Elevated PSA   . Erectile dysfunction   . Gout   . Hereditary and idiopathic peripheral neuropathy 08/02/2016  . Hypertension   . Memory changes   . Memory difficulties 04/05/2015  . Nephrolithiasis    hx of  . Obstructive sleep apnea   . Partial Achilles tendon tear   . Rosacea, acne   . Tremor 04/05/2015   Past Surgical History:  Procedure Laterality Date  . CARPAL TUNNEL RELEASE  2004  . CARPAL TUNNEL RELEASE  10/31/2011   Procedure: CARPAL TUNNEL RELEASE;  Surgeon: Cammie Sickle., MD;  Location: [facility];  Service: Orthopedics;  Laterality: Left;  . EYE SURGERY     cataracts  . HAND SURGERY  2007   right hand  . INGUINAL HERNIA REPAIR    . JOINT REPLACEMENT     left total knee  . KNEE SURGERY  1961   left knee  . TONSILLECTOMY    . TONSILLECTOMY AND ADENOIDECTOMY    . TRANSURETHRAL RESECTION OF PROSTATE      Allergies  Allergen Reactions  . Ciprofloxacin     REACTION: Diarrhea,l rectal irritation    Outpatient Encounter Medications as of 03/25/2019  Medication Sig  . acetaminophen (TYLENOL) 500 MG tablet  Take 500 mg by mouth 2 (two) times daily.  Marland Kitchen allopurinol (ZYLOPRIM) 100 MG tablet Take 100 mg by mouth daily.   Marland Kitchen amLODipine (NORVASC) 10 MG tablet Take 10 mg by mouth daily.  Marland Kitchen aspirin 81 MG tablet Take 81 mg by mouth daily.  . carboxymethylcellulose (REFRESH PLUS) 0.5 % SOLN 2 drops 2 (two) times daily as needed.  . Cholecalciferol (VITAMIN D-3 PO) Take 2,000 Units by mouth daily.   . diclofenac sodium (VOLTAREN) 1 % GEL Apply 2 g topically daily as needed.  . Eyelid Cleansers (OCUSOFT EYELID CLEANSING) PADS Apply topically every morning.  . finasteride (PROSCAR) 5 MG tablet Take 5 mg by mouth at bedtime.   . hydrocortisone 2.5 % cream Apply 1 application topically as needed.  Marland Kitchen lisinopril (PRINIVIL,ZESTRIL) 40 MG tablet Take 40  mg by mouth daily.   . metFORMIN (GLUCOPHAGE) 500 MG tablet Take 500 mg by mouth daily with breakfast.  . pantoprazole (PROTONIX) 40 MG tablet Take 40 mg by mouth daily.   . sertraline (ZOLOFT) 100 MG tablet Take 100 mg by mouth at bedtime.   No facility-administered encounter medications on file as of 03/25/2019.     Review of Systems  Constitutional: Negative for activity change, appetite change, fever and unexpected weight change.  HENT: Negative for congestion.   Eyes: Negative for visual disturbance.       Retinopathy  Respiratory: Negative for chest tightness and shortness of breath.   Cardiovascular: Negative for chest pain, palpitations and leg swelling.  Gastrointestinal: Negative for abdominal pain, constipation, diarrhea, nausea and vomiting.  Genitourinary: Negative for dysuria.  Musculoskeletal: Positive for arthralgias and gait problem.  Neurological: Positive for weakness. Negative for dizziness.       Aphasia progressing  Psychiatric/Behavioral: Positive for confusion.    Immunization History  Administered Date(s) Administered  . Influenza Split 08/18/2011, 08/08/2012  . Influenza Whole 08/21/2006, 07/11/2007, 09/13/2009, 07/13/2010  . Influenza, Seasonal, Injecte, Preservative Fre 07/10/2013  . Influenza,inj,Quad PF,6+ Mos 07/30/2018  . Influenza-Unspecified 08/08/2012, 07/17/2014, 08/10/2014, 06/16/2015, 07/30/2017  . Pneumococcal Conjugate-13 01/06/2014  . Pneumococcal Polysaccharide-23 03/12/2012, 03/10/2014  . Td 10/09/1992  . Tdap 03/12/2012, 01/08/2014  . Zoster 12/26/2007, 03/10/2014  . Zoster Recombinat (Shingrix) 01/23/2018   Pertinent  Health Maintenance Due  Topic Date Due  . FOOT EXAM  01/08/2019  . OPHTHALMOLOGY EXAM  04/04/2019  . INFLUENZA VACCINE  05/10/2019  . HEMOGLOBIN A1C  05/23/2019  . PNA vac Low Risk Adult  Completed  . COLONOSCOPY  Discontinued   Fall Risk  01/10/2019 04/30/2018  Falls in the past year? 0 No  Number falls in past  yr: 0 -  Injury with Fall? 0 -  Risk for fall due to : Impaired balance/gait;Mental status change -  Follow up Falls evaluation completed -   Functional Status Survey:    Vitals:   03/25/19 1358  BP: 121/73  Pulse: 60  Resp: 17  Temp: 98.2 F (36.8 C)  TempSrc: Oral  SpO2: 99%  Weight: 249 lb (112.9 kg)  Height: 6\' 3"  (1.905 m)   Body mass index is 31.12 kg/m. Physical Exam Vitals signs and nursing note reviewed.  Constitutional:      General: He is not in acute distress.    Appearance: Normal appearance. He is obese. He is not toxic-appearing.  HENT:     Head: Normocephalic and atraumatic.  Cardiovascular:     Rate and Rhythm: Normal rate and regular rhythm.     Pulses: Normal pulses.  Heart sounds: Normal heart sounds.  Pulmonary:     Effort: Pulmonary effort is normal.     Breath sounds: Normal breath sounds.  Musculoskeletal: Normal range of motion.        General: No swelling or tenderness.     Right lower leg: No edema.     Left lower leg: No edema.  Skin:    General: Skin is warm and dry.  Neurological:     Mental Status: He is alert.     Comments: Less speech this visit than last  Psychiatric:        Mood and Affect: Mood normal.     Comments: Smiles and appears pleasant, nursing did not report behaviors to me this time     Labs reviewed: Recent Labs    05/17/18 0900 11/22/18  NA 140 140  K 4.3 4.1  BUN 25* 24*  CREATININE 1.3 1.5*   No results for input(s): AST, ALT, ALKPHOS, BILITOT, PROT, ALBUMIN in the last 8760 hours. Recent Labs    11/22/18  WBC 7.0  HGB 11.5*  HCT 35*  PLT 191   Lab Results  Component Value Date   TSH 1.74 11/22/2018   Lab Results  Component Value Date   HGBA1C 6.6 11/22/2018   Lab Results  Component Value Date   CHOL 151 11/11/2013   HDL 33.00 (L) 11/11/2013   LDLCALC 96 11/11/2013   LDLDIRECT 97.8 05/26/2009   TRIG 112.0 11/11/2013   CHOLHDL 5 11/11/2013    Assessment/Plan 1. Mixed Alzheimer's  and vascular dementia ([place]) -aphasia progressing -last time, there were concerns about refusing meds and baths and agitation, but none of this was mentioned today -cont snf level of care  2. Benign hypertension with chronic kidney disease, stage III ([place]) -bp well controlled, cont same regimen  3. Severe obstructive sleep apnea -cont cpap therapy  4. Diabetes mellitus type 2 without retinopathy ([place]) -has been well controlled with metformin use -cont to monitor hba1c twice a year  5.  CKD3:  Avoid nephrotoxic agents like nsaids, dose adjust renally excreted meds, hydrate.  6.  Spinal stenosis lumbar region:  Chronic back pain from prior football and other athletic pursuits -cont tylenol bid; has voltaren more for knees but could be used here if needed   Family/ staff Communication: discussed with snf nurse   Labs/tests ordered:  No new today  Xela Oregel L. Jovannie Ulibarri, D.O. [facility] [address], [place] [address] Cell Phone (Mon-Fri 8am-5pm):  [phone] On Call:  [phone] & follow prompts after 5pm & weekends Office Phone:  [phone] Office Fax:  [phone]

## 2019-04-17 ENCOUNTER — Non-Acute Institutional Stay (SKILLED_NURSING_FACILITY): Payer: Medicare Other | Admitting: Adult Health

## 2019-04-17 ENCOUNTER — Encounter: Payer: Self-pay | Admitting: Adult Health

## 2019-04-17 DIAGNOSIS — I129 Hypertensive chronic kidney disease with stage 1 through stage 4 chronic kidney disease, or unspecified chronic kidney disease: Secondary | ICD-10-CM

## 2019-04-17 DIAGNOSIS — N401 Enlarged prostate with lower urinary tract symptoms: Secondary | ICD-10-CM

## 2019-04-17 DIAGNOSIS — F0150 Vascular dementia without behavioral disturbance: Secondary | ICD-10-CM | POA: Diagnosis not present

## 2019-04-17 DIAGNOSIS — E119 Type 2 diabetes mellitus without complications: Secondary | ICD-10-CM | POA: Diagnosis not present

## 2019-04-17 DIAGNOSIS — F0280 Dementia in other diseases classified elsewhere without behavioral disturbance: Secondary | ICD-10-CM | POA: Diagnosis not present

## 2019-04-17 DIAGNOSIS — N183 Chronic kidney disease, stage 3 unspecified: Secondary | ICD-10-CM

## 2019-04-17 DIAGNOSIS — F329 Major depressive disorder, single episode, unspecified: Secondary | ICD-10-CM | POA: Diagnosis not present

## 2019-04-17 DIAGNOSIS — G309 Alzheimer's disease, unspecified: Secondary | ICD-10-CM | POA: Diagnosis not present

## 2019-04-17 LAB — HM DIABETES FOOT EXAM

## 2019-04-17 NOTE — Progress Notes (Signed)
Location:  Occupational psychologist of Service:  SNF (31) Provider:  Cindi Carbon, ANP [place] [phone]   Gayland Curry, DO  Patient Care Team: Gayland Curry, DO as PCP - General (Geriatric Medicine)  Extended Emergency Contact Information Primary Emergency Contact: [place],[name] Address: [address]          [place], [place] [address] [name] of [place] Phone: [phone] Mobile Phone: [phone] Relation: Spouse  Code Status:  DNR Goals of care: Advanced Directive information Advanced Directives 12/17/2018  Does Patient Have a Medical Advance Directive? Yes  Type of Paramedic of [place];Living will;Out of facility DNR (pink MOST or yellow form)  Does patient want to make changes to medical advance directive? No - Patient declined  Copy of [place] in Chart? Yes - validated most recent copy scanned in chart (See row information)  Would patient like information on creating a medical advance directive? -  Pre-existing out of facility DNR order (yellow form or pink MOST form) Yellow form placed in chart (order not valid for inpatient use)     Chief Complaint  Patient presents with  . Medical Management of Chronic Issues    HPI:  Pt is a 78 y.o. male seen today for medical management of chronic diseases.   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  He is mostly pleasant and non verbal. He can follow commands. The nurse reports there have been a couple of rare instances of resistance to personal care  Depression: he is no longer on wellbutrin as it caused worsening agitation. His mood is stable with no reports of crying, decreased appetite, lack of sleep, or anxiety BP controlled No urinary or bowel symptoms are reported.   DM II currently on metformin daily  Lab Results  Component Value Date   HGBA1C 6.6  11/22/2018     Past Medical History:  Diagnosis Date  . Abnormality of gait 04/05/2015  . Benign prostatic hypertrophy   . Dementia ([place])   . Diabetes mellitus type II    no meds  . Elevated PSA   . Erectile dysfunction   . Gout   . Hereditary and idiopathic peripheral neuropathy 08/02/2016  . Hypertension   . Memory changes   . Memory difficulties 04/05/2015  . Nephrolithiasis    hx of  . Obstructive sleep apnea   . Partial Achilles tendon tear   . Rosacea, acne   . Tremor 04/05/2015   Past Surgical History:  Procedure Laterality Date  . CARPAL TUNNEL RELEASE  2004  . CARPAL TUNNEL RELEASE  10/31/2011   Procedure: CARPAL TUNNEL RELEASE;  Surgeon: Cammie Sickle., MD;  Location: [facility];  Service: Orthopedics;  Laterality: Left;  . EYE SURGERY     cataracts  . HAND SURGERY  2007   right hand  . INGUINAL HERNIA REPAIR    . JOINT REPLACEMENT     left total knee  . KNEE SURGERY  1961   left knee  . TONSILLECTOMY    . TONSILLECTOMY AND ADENOIDECTOMY    . TRANSURETHRAL RESECTION OF PROSTATE      Allergies  Allergen Reactions  . Ciprofloxacin     REACTION: Diarrhea,l rectal irritation    Outpatient Encounter Medications as of 04/17/2019  Medication Sig  . acetaminophen (TYLENOL) 500 MG tablet Take 500 mg by mouth 2 (  two) times daily.  Marland Kitchen allopurinol (ZYLOPRIM) 100 MG tablet Take 100 mg by mouth daily.   Marland Kitchen amLODipine (NORVASC) 10 MG tablet Take 10 mg by mouth daily.  Marland Kitchen aspirin 81 MG tablet Take 81 mg by mouth daily.  . carboxymethylcellulose (REFRESH PLUS) 0.5 % SOLN 2 drops 2 (two) times daily as needed.  . Cholecalciferol (VITAMIN D-3 PO) Take 2,000 Units by mouth daily.   . diclofenac sodium (VOLTAREN) 1 % GEL Apply 2 g topically daily as needed.  . Eyelid Cleansers (OCUSOFT EYELID CLEANSING) PADS Apply topically every morning.  . finasteride (PROSCAR) 5 MG tablet Take 5 mg by mouth at bedtime.   . hydrocortisone 2.5 % cream Apply 1 application  topically as needed.  Marland Kitchen lisinopril (PRINIVIL,ZESTRIL) 40 MG tablet Take 40 mg by mouth daily.   . metFORMIN (GLUCOPHAGE) 500 MG tablet Take 500 mg by mouth daily with breakfast.  . pantoprazole (PROTONIX) 40 MG tablet Take 40 mg by mouth daily.   . sertraline (ZOLOFT) 100 MG tablet Take 100 mg by mouth at bedtime.   No facility-administered encounter medications on file as of 04/17/2019.     Review of Systems  Unable to perform ROS: Dementia    Immunization History  Administered Date(s) Administered  . Influenza Split 08/18/2011, 08/08/2012  . Influenza Whole 08/21/2006, 07/11/2007, 09/13/2009, 07/13/2010  . Influenza, Seasonal, Injecte, Preservative Fre 07/10/2013  . Influenza,inj,Quad PF,6+ Mos 07/30/2018  . Influenza-Unspecified 08/08/2012, 07/17/2014, 08/10/2014, 06/16/2015, 07/30/2017  . Pneumococcal Conjugate-13 01/06/2014  . Pneumococcal Polysaccharide-23 03/12/2012, 03/10/2014  . Td 10/09/1992  . Tdap 03/12/2012, 01/08/2014  . Zoster 12/26/2007, 03/10/2014  . Zoster Recombinat (Shingrix) 01/23/2018   Pertinent  Health Maintenance Due  Topic Date Due  . FOOT EXAM  01/08/2019  . INFLUENZA VACCINE  05/10/2019  . HEMOGLOBIN A1C  05/23/2019  . PNA vac Low Risk Adult  Completed  . OPHTHALMOLOGY EXAM  Discontinued  . COLONOSCOPY  Discontinued   Fall Risk  01/10/2019 04/30/2018  Falls in the past year? 0 No  Number falls in past yr: 0 -  Injury with Fall? 0 -  Risk for fall due to : Impaired balance/gait;Mental status change -  Follow up Falls evaluation completed -   Functional Status Survey:    Vitals:   04/17/19 1002  Weight: 248 lb 3.2 oz (112.6 kg)   Wt Readings from Last 3 Encounters:  04/17/19 248 lb 3.2 oz (112.6 kg)  03/25/19 249 lb (112.9 kg)  03/04/19 249 lb (112.9 kg)    Body mass index is 31.02 kg/m. Physical Exam Vitals signs and nursing note reviewed.  Constitutional:      General: He is not in acute distress.    Appearance: He is not  diaphoretic.  HENT:     Head: Normocephalic and atraumatic.     Nose: Nose normal. No congestion.     Mouth/Throat:     Mouth: Mucous membranes are moist.     Pharynx: Oropharynx is clear. No oropharyngeal exudate.  Eyes:     Conjunctiva/sclera: Conjunctivae normal.     Pupils: Pupils are equal, round, and reactive to light.  Neck:     Thyroid: No thyromegaly.     Vascular: No JVD.     Trachea: No tracheal deviation.  Cardiovascular:     Rate and Rhythm: Normal rate and regular rhythm.     Pulses:          Dorsalis pedis pulses are 2+ on the right side and 2+ on the  left side.       Posterior tibial pulses are 2+ on the right side and 2+ on the left side.     Heart sounds: No murmur.  Pulmonary:     Effort: Pulmonary effort is normal. No respiratory distress.     Breath sounds: Normal breath sounds. No wheezing.  Abdominal:     General: Bowel sounds are normal. There is no distension.     Palpations: Abdomen is soft.     Tenderness: There is no abdominal tenderness.  Musculoskeletal:     Right lower leg: No edema.     Left lower leg: No edema.     Right foot: Normal range of motion. No deformity or foot drop.     Left foot: Normal range of motion. No deformity or foot drop.  Feet:     Right foot:     Skin integrity: Skin integrity normal.     Left foot:     Skin integrity: Skin integrity normal.  Lymphadenopathy:     Cervical: No cervical adenopathy.  Skin:    General: Skin is warm and dry.  Neurological:     General: No focal deficit present.     Mental Status: He is alert. Mental status is at baseline.     Comments: Minimally but able to f/c.  MAE  Psychiatric:        Mood and Affect: Mood normal.     Labs reviewed: Recent Labs    05/17/18 0900 11/22/18  NA 140 140  K 4.3 4.1  BUN 25* 24*  CREATININE 1.3 1.5*   No results for input(s): AST, ALT, ALKPHOS, BILITOT, PROT, ALBUMIN in the last 8760 hours. Recent Labs    11/22/18  WBC 7.0  HGB 11.5*  HCT  35*  PLT 191   Lab Results  Component Value Date   TSH 1.74 11/22/2018   Lab Results  Component Value Date   HGBA1C 6.6 11/22/2018   Lab Results  Component Value Date   CHOL 151 11/11/2013   HDL 33.00 (L) 11/11/2013   LDLCALC 96 11/11/2013   LDLDIRECT 97.8 05/26/2009   TRIG 112.0 11/11/2013   CHOLHDL 5 11/11/2013    Significant Diagnostic Results in last 30 days:  No results found.  Assessment/Plan 1. Diabetes mellitus type 2 without retinopathy ([place]) Lab Results  Component Value Date   HGBA1C 6.6 11/22/2018  Recheck due next month, consider discontinuing metformin   2. Mixed Alzheimer's and vascular dementia ([place]) Progressive decline in cognition and physical function c/w the disease. Continue supportive care in the skilled environment.  3. Benign hypertension with chronic kidney disease, stage III (HCC) Controlled, continue lisinopril 40 mg qd   4. Benign non-nodular prostatic hyperplasia with lower urinary tract symptoms No current symptoms, continue finasteride 5 mg qhs  5. Chronic kidney disease, stage III (moderate) (HCC) Continue to periodically monitor BMP and avoid nephrotoxic agents  6. Reactive depression Doing well on zoloft but given his periods of resistant to care and the covid 19 pandemic, would not taper zoloft.     Family/ staff Communication: staff  Labs/tests ordered:  NA

## 2019-04-30 DIAGNOSIS — M6389 Disorders of muscle in diseases classified elsewhere, multiple sites: Secondary | ICD-10-CM | POA: Diagnosis not present

## 2019-04-30 DIAGNOSIS — F0150 Vascular dementia without behavioral disturbance: Secondary | ICD-10-CM | POA: Diagnosis not present

## 2019-04-30 DIAGNOSIS — R293 Abnormal posture: Secondary | ICD-10-CM | POA: Diagnosis not present

## 2019-04-30 DIAGNOSIS — R278 Other lack of coordination: Secondary | ICD-10-CM | POA: Diagnosis not present

## 2019-05-02 DIAGNOSIS — F0150 Vascular dementia without behavioral disturbance: Secondary | ICD-10-CM | POA: Diagnosis not present

## 2019-05-02 DIAGNOSIS — M6389 Disorders of muscle in diseases classified elsewhere, multiple sites: Secondary | ICD-10-CM | POA: Diagnosis not present

## 2019-05-02 DIAGNOSIS — R278 Other lack of coordination: Secondary | ICD-10-CM | POA: Diagnosis not present

## 2019-05-02 DIAGNOSIS — R293 Abnormal posture: Secondary | ICD-10-CM | POA: Diagnosis not present

## 2019-05-05 DIAGNOSIS — R293 Abnormal posture: Secondary | ICD-10-CM | POA: Diagnosis not present

## 2019-05-05 DIAGNOSIS — F0150 Vascular dementia without behavioral disturbance: Secondary | ICD-10-CM | POA: Diagnosis not present

## 2019-05-05 DIAGNOSIS — R278 Other lack of coordination: Secondary | ICD-10-CM | POA: Diagnosis not present

## 2019-05-05 DIAGNOSIS — M6389 Disorders of muscle in diseases classified elsewhere, multiple sites: Secondary | ICD-10-CM | POA: Diagnosis not present

## 2019-05-07 DIAGNOSIS — F0150 Vascular dementia without behavioral disturbance: Secondary | ICD-10-CM | POA: Diagnosis not present

## 2019-05-07 DIAGNOSIS — R293 Abnormal posture: Secondary | ICD-10-CM | POA: Diagnosis not present

## 2019-05-07 DIAGNOSIS — R278 Other lack of coordination: Secondary | ICD-10-CM | POA: Diagnosis not present

## 2019-05-07 DIAGNOSIS — M6389 Disorders of muscle in diseases classified elsewhere, multiple sites: Secondary | ICD-10-CM | POA: Diagnosis not present

## 2019-05-09 DIAGNOSIS — R293 Abnormal posture: Secondary | ICD-10-CM | POA: Diagnosis not present

## 2019-05-09 DIAGNOSIS — R278 Other lack of coordination: Secondary | ICD-10-CM | POA: Diagnosis not present

## 2019-05-09 DIAGNOSIS — M6389 Disorders of muscle in diseases classified elsewhere, multiple sites: Secondary | ICD-10-CM | POA: Diagnosis not present

## 2019-05-09 DIAGNOSIS — F0150 Vascular dementia without behavioral disturbance: Secondary | ICD-10-CM | POA: Diagnosis not present

## 2019-05-12 DIAGNOSIS — R1311 Dysphagia, oral phase: Secondary | ICD-10-CM | POA: Diagnosis not present

## 2019-05-12 DIAGNOSIS — F0280 Dementia in other diseases classified elsewhere without behavioral disturbance: Secondary | ICD-10-CM | POA: Diagnosis not present

## 2019-05-12 DIAGNOSIS — F0150 Vascular dementia without behavioral disturbance: Secondary | ICD-10-CM | POA: Diagnosis not present

## 2019-05-12 DIAGNOSIS — R278 Other lack of coordination: Secondary | ICD-10-CM | POA: Diagnosis not present

## 2019-05-12 DIAGNOSIS — R4189 Other symptoms and signs involving cognitive functions and awareness: Secondary | ICD-10-CM | POA: Diagnosis not present

## 2019-05-12 DIAGNOSIS — M6389 Disorders of muscle in diseases classified elsewhere, multiple sites: Secondary | ICD-10-CM | POA: Diagnosis not present

## 2019-05-12 DIAGNOSIS — I69890 Apraxia following other cerebrovascular disease: Secondary | ICD-10-CM | POA: Diagnosis not present

## 2019-05-12 DIAGNOSIS — R293 Abnormal posture: Secondary | ICD-10-CM | POA: Diagnosis not present

## 2019-05-14 DIAGNOSIS — R278 Other lack of coordination: Secondary | ICD-10-CM | POA: Diagnosis not present

## 2019-05-14 DIAGNOSIS — R4189 Other symptoms and signs involving cognitive functions and awareness: Secondary | ICD-10-CM | POA: Diagnosis not present

## 2019-05-14 DIAGNOSIS — R293 Abnormal posture: Secondary | ICD-10-CM | POA: Diagnosis not present

## 2019-05-14 DIAGNOSIS — M6389 Disorders of muscle in diseases classified elsewhere, multiple sites: Secondary | ICD-10-CM | POA: Diagnosis not present

## 2019-05-14 DIAGNOSIS — R1311 Dysphagia, oral phase: Secondary | ICD-10-CM | POA: Diagnosis not present

## 2019-05-14 DIAGNOSIS — F0150 Vascular dementia without behavioral disturbance: Secondary | ICD-10-CM | POA: Diagnosis not present

## 2019-05-15 ENCOUNTER — Non-Acute Institutional Stay (SKILLED_NURSING_FACILITY): Payer: Medicare Other | Admitting: Adult Health

## 2019-05-15 DIAGNOSIS — N183 Chronic kidney disease, stage 3 unspecified: Secondary | ICD-10-CM

## 2019-05-15 DIAGNOSIS — G309 Alzheimer's disease, unspecified: Secondary | ICD-10-CM | POA: Diagnosis not present

## 2019-05-15 DIAGNOSIS — G609 Hereditary and idiopathic neuropathy, unspecified: Secondary | ICD-10-CM | POA: Diagnosis not present

## 2019-05-15 DIAGNOSIS — E119 Type 2 diabetes mellitus without complications: Secondary | ICD-10-CM

## 2019-05-15 DIAGNOSIS — I129 Hypertensive chronic kidney disease with stage 1 through stage 4 chronic kidney disease, or unspecified chronic kidney disease: Secondary | ICD-10-CM | POA: Diagnosis not present

## 2019-05-15 DIAGNOSIS — F0150 Vascular dementia without behavioral disturbance: Secondary | ICD-10-CM | POA: Diagnosis not present

## 2019-05-15 DIAGNOSIS — G4733 Obstructive sleep apnea (adult) (pediatric): Secondary | ICD-10-CM | POA: Diagnosis not present

## 2019-05-15 DIAGNOSIS — F0280 Dementia in other diseases classified elsewhere without behavioral disturbance: Secondary | ICD-10-CM

## 2019-05-16 DIAGNOSIS — M6389 Disorders of muscle in diseases classified elsewhere, multiple sites: Secondary | ICD-10-CM | POA: Diagnosis not present

## 2019-05-16 DIAGNOSIS — R278 Other lack of coordination: Secondary | ICD-10-CM | POA: Diagnosis not present

## 2019-05-16 DIAGNOSIS — R4189 Other symptoms and signs involving cognitive functions and awareness: Secondary | ICD-10-CM | POA: Diagnosis not present

## 2019-05-16 DIAGNOSIS — R293 Abnormal posture: Secondary | ICD-10-CM | POA: Diagnosis not present

## 2019-05-16 DIAGNOSIS — Z79899 Other long term (current) drug therapy: Secondary | ICD-10-CM | POA: Diagnosis not present

## 2019-05-16 DIAGNOSIS — E119 Type 2 diabetes mellitus without complications: Secondary | ICD-10-CM | POA: Diagnosis not present

## 2019-05-16 DIAGNOSIS — F0150 Vascular dementia without behavioral disturbance: Secondary | ICD-10-CM | POA: Diagnosis not present

## 2019-05-16 DIAGNOSIS — R1311 Dysphagia, oral phase: Secondary | ICD-10-CM | POA: Diagnosis not present

## 2019-05-16 LAB — HEMOGLOBIN A1C: Hemoglobin A1C: 6

## 2019-05-19 DIAGNOSIS — F0150 Vascular dementia without behavioral disturbance: Secondary | ICD-10-CM | POA: Diagnosis not present

## 2019-05-19 DIAGNOSIS — M6389 Disorders of muscle in diseases classified elsewhere, multiple sites: Secondary | ICD-10-CM | POA: Diagnosis not present

## 2019-05-19 DIAGNOSIS — R1311 Dysphagia, oral phase: Secondary | ICD-10-CM | POA: Diagnosis not present

## 2019-05-19 DIAGNOSIS — R278 Other lack of coordination: Secondary | ICD-10-CM | POA: Diagnosis not present

## 2019-05-19 DIAGNOSIS — R293 Abnormal posture: Secondary | ICD-10-CM | POA: Diagnosis not present

## 2019-05-19 DIAGNOSIS — R4189 Other symptoms and signs involving cognitive functions and awareness: Secondary | ICD-10-CM | POA: Diagnosis not present

## 2019-05-20 ENCOUNTER — Encounter: Payer: Self-pay | Admitting: Adult Health

## 2019-05-20 NOTE — Progress Notes (Signed)
Location:  Occupational psychologist of Service:  SNF (31) Provider:   Cindi Carbon, ANP [place] [phone]   Gayland Curry, DO  Patient Care Team: Gayland Curry, DO as PCP - General (Geriatric Medicine)  Extended Emergency Contact Information Primary Emergency Contact: Diez,[name] Address: [address]          [place], [place] [address] [name] of [place] Phone: [phone] Mobile Phone: [phone] Relation: Spouse  Code Status:  DNR Goals of care: Advanced Directive information Advanced Directives 12/17/2018  Does Patient Have a Medical Advance Directive? Yes  Type of Paramedic of [place];Living will;Out of facility DNR (pink MOST or yellow form)  Does patient want to make changes to medical advance directive? No - Patient declined  Copy of [place] in Chart? Yes - validated most recent copy scanned in chart (See row information)  Would patient like information on creating a medical advance directive? -  Pre-existing out of facility DNR order (yellow form or pink MOST form) Yellow form placed in chart (order not valid for inpatient use)     Chief Complaint  Patient presents with  . Medical Management of Chronic Issues    HPI:  Pt is a 78 y.o. male seen today for medical management of chronic diseases.    DM II: CBGs range 97-139 currently on metformin  Sleep apnea: wears cpap at night, takes it off at times. No somnolence or low 02 sats reported  No reports of pain or numbness  Mixed dementia (hx of TIA) : no reports of behavioral issues. Non verbal and not able to contribute to the hx. Dependent on staff for all ADLs and hoyer lift.   Functional status: hoyer lift, incontinent  Past Medical History:  Diagnosis Date  . Abnormality of gait 04/05/2015  . Benign prostatic hypertrophy   . Dementia ([place])   . Diabetes mellitus type II    no meds  . Elevated PSA   .  Erectile dysfunction   . Gout   . Hereditary and idiopathic peripheral neuropathy 08/02/2016  . Hypertension   . Memory changes   . Memory difficulties 04/05/2015  . Nephrolithiasis    hx of  . Obstructive sleep apnea   . Partial Achilles tendon tear   . Rosacea, acne   . Tremor 04/05/2015   Past Surgical History:  Procedure Laterality Date  . CARPAL TUNNEL RELEASE  2004  . CARPAL TUNNEL RELEASE  10/31/2011   Procedure: CARPAL TUNNEL RELEASE;  Surgeon: Cammie Sickle., MD;  Location: [facility];  Service: Orthopedics;  Laterality: Left;  . EYE SURGERY     cataracts  . HAND SURGERY  2007   right hand  . INGUINAL HERNIA REPAIR    . JOINT REPLACEMENT     left total knee  . KNEE SURGERY  1961   left knee  . TONSILLECTOMY    . TONSILLECTOMY AND ADENOIDECTOMY    . TRANSURETHRAL RESECTION OF PROSTATE      Allergies  Allergen Reactions  . Ciprofloxacin     REACTION: Diarrhea,l rectal irritation    Outpatient Encounter Medications as of 05/15/2019  Medication Sig  . allopurinol (ZYLOPRIM) 100 MG tablet Take 100 mg by mouth daily.   Marland Kitchen amLODipine (NORVASC) 10 MG tablet Take 10 mg by mouth daily.  Marland Kitchen aspirin 81 MG tablet Take 81 mg by mouth daily.  . carboxymethylcellulose (REFRESH PLUS) 0.5 % SOLN  2 drops 2 (two) times daily as needed.  . Cholecalciferol (VITAMIN D-3 PO) Take 2,000 Units by mouth daily.   . diclofenac sodium (VOLTAREN) 1 % GEL Apply 2 g topically daily as needed.  . Eyelid Cleansers (OCUSOFT EYELID CLEANSING) PADS Apply topically every morning.  . finasteride (PROSCAR) 5 MG tablet Take 5 mg by mouth at bedtime.   . hydrocortisone 2.5 % cream Apply 1 application topically as needed.  Marland Kitchen lisinopril (PRINIVIL,ZESTRIL) 40 MG tablet Take 40 mg by mouth daily.   . metFORMIN (GLUCOPHAGE) 500 MG tablet Take 500 mg by mouth daily with breakfast.  . pantoprazole (PROTONIX) 40 MG tablet Take 40 mg by mouth daily.   . sertraline (ZOLOFT) 100 MG tablet Take  100 mg by mouth at bedtime.  Marland Kitchen acetaminophen (TYLENOL) 500 MG tablet Take 500 mg by mouth 2 (two) times daily.   No facility-administered encounter medications on file as of 05/15/2019.     Review of Systems  Unable to perform ROS: Dementia    Immunization History  Administered Date(s) Administered  . Influenza Split 08/18/2011, 08/08/2012  . Influenza Whole 08/21/2006, 07/11/2007, 09/13/2009, 07/13/2010  . Influenza, Seasonal, Injecte, Preservative Fre 07/10/2013  . Influenza,inj,Quad PF,6+ Mos 07/30/2018  . Influenza-Unspecified 08/08/2012, 07/17/2014, 08/10/2014, 06/16/2015, 07/30/2017  . Pneumococcal Conjugate-13 01/06/2014  . Pneumococcal Polysaccharide-23 03/12/2012, 03/10/2014  . Td 10/09/1992  . Tdap 03/12/2012, 01/08/2014  . Zoster 12/26/2007, 03/10/2014  . Zoster Recombinat (Shingrix) 01/23/2018   Pertinent  Health Maintenance Due  Topic Date Due  . INFLUENZA VACCINE  05/10/2019  . HEMOGLOBIN A1C  05/23/2019  . FOOT EXAM  04/16/2020  . PNA vac Low Risk Adult  Completed  . OPHTHALMOLOGY EXAM  Discontinued  . COLONOSCOPY  Discontinued   Fall Risk  01/10/2019 04/30/2018  Falls in the past year? 0 No  Number falls in past yr: 0 -  Injury with Fall? 0 -  Risk for fall due to : Impaired balance/gait;Mental status change -  Follow up Falls evaluation completed -   Functional Status Survey:   Wt Readings from Last 3 Encounters:  05/20/19 249 lb 9.6 oz (113.2 kg)  04/17/19 248 lb 3.2 oz (112.6 kg)  03/25/19 249 lb (112.9 kg)   Vitals:   05/20/19 0749  Weight: 249 lb 9.6 oz (113.2 kg)   Body mass index is 31.2 kg/m. Physical Exam Vitals signs and nursing note reviewed.  Constitutional:      General: He is not in acute distress.    Appearance: He is not diaphoretic.  HENT:     Head: Normocephalic and atraumatic.  Eyes:     General:        Right eye: No discharge.        Left eye: No discharge.     Conjunctiva/sclera: Conjunctivae normal.     Pupils: Pupils  are equal, round, and reactive to light.  Neck:     Thyroid: No thyromegaly.     Vascular: No JVD.     Trachea: No tracheal deviation.  Cardiovascular:     Rate and Rhythm: Normal rate and regular rhythm.     Heart sounds: No murmur.  Pulmonary:     Effort: Pulmonary effort is normal. No respiratory distress.     Breath sounds: Normal breath sounds. No wheezing.  Abdominal:     General: Bowel sounds are normal. There is no distension.     Palpations: Abdomen is soft.     Tenderness: There is no abdominal tenderness.  Musculoskeletal:  Right lower leg: No edema.     Left lower leg: No edema.  Lymphadenopathy:     Cervical: No cervical adenopathy.  Skin:    General: Skin is warm and dry.  Neurological:     General: No focal deficit present.     Mental Status: He is alert. Mental status is at baseline.     Comments: Not verbal. Intermittently able to f/c.  Psychiatric:        Mood and Affect: Mood normal.     Labs reviewed: Recent Labs    11/22/18  NA 140  K 4.1  BUN 24*  CREATININE 1.5*   No results for input(s): AST, ALT, ALKPHOS, BILITOT, PROT, ALBUMIN in the last 8760 hours. Recent Labs    11/22/18  WBC 7.0  HGB 11.5*  HCT 35*  PLT 191   Lab Results  Component Value Date   TSH 1.74 11/22/2018   Lab Results  Component Value Date   HGBA1C 6.6 11/22/2018   Lab Results  Component Value Date   CHOL 151 11/11/2013   HDL 33.00 (L) 11/11/2013   LDLCALC 96 11/11/2013   LDLDIRECT 97.8 05/26/2009   TRIG 112.0 11/11/2013   CHOLHDL 5 11/11/2013    Significant Diagnostic Results in last 30 days:  No results found.  Assessment/Plan  1. Benign hypertension with chronic kidney disease, stage III (HCC) BP controlled, continue Norvasc 10 mg qd and lisinopril 40 mg qd    2. Severe obstructive sleep apnea Continue cpap qhs, noted times where he removes it at night.   3. Mixed Alzheimer's and vascular dementia ([place]) Progressive decline in cognition and  physical function c/w the disease. Continue supportive care in the skilled environment. Continue baby aspirin   4. Chronic kidney disease, stage III (moderate) (HCC) Continue to periodically monitor BMP and avoid nephrotoxic agents  5. Hereditary and idiopathic peripheral neuropathy Not current issues regarding this problem.   6. DM II without retinopathy Check A1C   Family/ staff Communication: staff   Labs/tests ordered:  A1C

## 2019-05-22 ENCOUNTER — Encounter: Payer: Self-pay | Admitting: Internal Medicine

## 2019-05-22 DIAGNOSIS — F0150 Vascular dementia without behavioral disturbance: Secondary | ICD-10-CM | POA: Diagnosis not present

## 2019-05-22 DIAGNOSIS — R4189 Other symptoms and signs involving cognitive functions and awareness: Secondary | ICD-10-CM | POA: Diagnosis not present

## 2019-05-22 DIAGNOSIS — R293 Abnormal posture: Secondary | ICD-10-CM | POA: Diagnosis not present

## 2019-05-22 DIAGNOSIS — R1311 Dysphagia, oral phase: Secondary | ICD-10-CM | POA: Diagnosis not present

## 2019-05-22 DIAGNOSIS — R278 Other lack of coordination: Secondary | ICD-10-CM | POA: Diagnosis not present

## 2019-05-22 DIAGNOSIS — M6389 Disorders of muscle in diseases classified elsewhere, multiple sites: Secondary | ICD-10-CM | POA: Diagnosis not present

## 2019-05-23 DIAGNOSIS — R1311 Dysphagia, oral phase: Secondary | ICD-10-CM | POA: Diagnosis not present

## 2019-05-23 DIAGNOSIS — R278 Other lack of coordination: Secondary | ICD-10-CM | POA: Diagnosis not present

## 2019-05-23 DIAGNOSIS — R293 Abnormal posture: Secondary | ICD-10-CM | POA: Diagnosis not present

## 2019-05-23 DIAGNOSIS — R4189 Other symptoms and signs involving cognitive functions and awareness: Secondary | ICD-10-CM | POA: Diagnosis not present

## 2019-05-23 DIAGNOSIS — M6389 Disorders of muscle in diseases classified elsewhere, multiple sites: Secondary | ICD-10-CM | POA: Diagnosis not present

## 2019-05-23 DIAGNOSIS — F0150 Vascular dementia without behavioral disturbance: Secondary | ICD-10-CM | POA: Diagnosis not present

## 2019-05-26 DIAGNOSIS — R1311 Dysphagia, oral phase: Secondary | ICD-10-CM | POA: Diagnosis not present

## 2019-05-26 DIAGNOSIS — R293 Abnormal posture: Secondary | ICD-10-CM | POA: Diagnosis not present

## 2019-05-26 DIAGNOSIS — R4189 Other symptoms and signs involving cognitive functions and awareness: Secondary | ICD-10-CM | POA: Diagnosis not present

## 2019-05-26 DIAGNOSIS — F0150 Vascular dementia without behavioral disturbance: Secondary | ICD-10-CM | POA: Diagnosis not present

## 2019-05-26 DIAGNOSIS — R278 Other lack of coordination: Secondary | ICD-10-CM | POA: Diagnosis not present

## 2019-05-26 DIAGNOSIS — M6389 Disorders of muscle in diseases classified elsewhere, multiple sites: Secondary | ICD-10-CM | POA: Diagnosis not present

## 2019-05-29 DIAGNOSIS — M6389 Disorders of muscle in diseases classified elsewhere, multiple sites: Secondary | ICD-10-CM | POA: Diagnosis not present

## 2019-05-29 DIAGNOSIS — R293 Abnormal posture: Secondary | ICD-10-CM | POA: Diagnosis not present

## 2019-05-29 DIAGNOSIS — F0150 Vascular dementia without behavioral disturbance: Secondary | ICD-10-CM | POA: Diagnosis not present

## 2019-05-29 DIAGNOSIS — R278 Other lack of coordination: Secondary | ICD-10-CM | POA: Diagnosis not present

## 2019-05-29 DIAGNOSIS — R1311 Dysphagia, oral phase: Secondary | ICD-10-CM | POA: Diagnosis not present

## 2019-05-29 DIAGNOSIS — R4189 Other symptoms and signs involving cognitive functions and awareness: Secondary | ICD-10-CM | POA: Diagnosis not present

## 2019-05-30 DIAGNOSIS — F0150 Vascular dementia without behavioral disturbance: Secondary | ICD-10-CM | POA: Diagnosis not present

## 2019-05-30 DIAGNOSIS — M6389 Disorders of muscle in diseases classified elsewhere, multiple sites: Secondary | ICD-10-CM | POA: Diagnosis not present

## 2019-05-30 DIAGNOSIS — R1311 Dysphagia, oral phase: Secondary | ICD-10-CM | POA: Diagnosis not present

## 2019-05-30 DIAGNOSIS — R278 Other lack of coordination: Secondary | ICD-10-CM | POA: Diagnosis not present

## 2019-05-30 DIAGNOSIS — R293 Abnormal posture: Secondary | ICD-10-CM | POA: Diagnosis not present

## 2019-05-30 DIAGNOSIS — R4189 Other symptoms and signs involving cognitive functions and awareness: Secondary | ICD-10-CM | POA: Diagnosis not present

## 2019-06-02 DIAGNOSIS — R278 Other lack of coordination: Secondary | ICD-10-CM | POA: Diagnosis not present

## 2019-06-02 DIAGNOSIS — R293 Abnormal posture: Secondary | ICD-10-CM | POA: Diagnosis not present

## 2019-06-02 DIAGNOSIS — R1311 Dysphagia, oral phase: Secondary | ICD-10-CM | POA: Diagnosis not present

## 2019-06-02 DIAGNOSIS — R4189 Other symptoms and signs involving cognitive functions and awareness: Secondary | ICD-10-CM | POA: Diagnosis not present

## 2019-06-02 DIAGNOSIS — M6389 Disorders of muscle in diseases classified elsewhere, multiple sites: Secondary | ICD-10-CM | POA: Diagnosis not present

## 2019-06-02 DIAGNOSIS — F0150 Vascular dementia without behavioral disturbance: Secondary | ICD-10-CM | POA: Diagnosis not present

## 2019-06-04 DIAGNOSIS — R293 Abnormal posture: Secondary | ICD-10-CM | POA: Diagnosis not present

## 2019-06-04 DIAGNOSIS — R1311 Dysphagia, oral phase: Secondary | ICD-10-CM | POA: Diagnosis not present

## 2019-06-04 DIAGNOSIS — F0150 Vascular dementia without behavioral disturbance: Secondary | ICD-10-CM | POA: Diagnosis not present

## 2019-06-04 DIAGNOSIS — R278 Other lack of coordination: Secondary | ICD-10-CM | POA: Diagnosis not present

## 2019-06-04 DIAGNOSIS — M6389 Disorders of muscle in diseases classified elsewhere, multiple sites: Secondary | ICD-10-CM | POA: Diagnosis not present

## 2019-06-04 DIAGNOSIS — R4189 Other symptoms and signs involving cognitive functions and awareness: Secondary | ICD-10-CM | POA: Diagnosis not present

## 2019-06-06 DIAGNOSIS — F0150 Vascular dementia without behavioral disturbance: Secondary | ICD-10-CM | POA: Diagnosis not present

## 2019-06-06 DIAGNOSIS — R4189 Other symptoms and signs involving cognitive functions and awareness: Secondary | ICD-10-CM | POA: Diagnosis not present

## 2019-06-06 DIAGNOSIS — R293 Abnormal posture: Secondary | ICD-10-CM | POA: Diagnosis not present

## 2019-06-06 DIAGNOSIS — R278 Other lack of coordination: Secondary | ICD-10-CM | POA: Diagnosis not present

## 2019-06-06 DIAGNOSIS — M6389 Disorders of muscle in diseases classified elsewhere, multiple sites: Secondary | ICD-10-CM | POA: Diagnosis not present

## 2019-06-06 DIAGNOSIS — R1311 Dysphagia, oral phase: Secondary | ICD-10-CM | POA: Diagnosis not present

## 2019-06-09 DIAGNOSIS — R4189 Other symptoms and signs involving cognitive functions and awareness: Secondary | ICD-10-CM | POA: Diagnosis not present

## 2019-06-09 DIAGNOSIS — M6389 Disorders of muscle in diseases classified elsewhere, multiple sites: Secondary | ICD-10-CM | POA: Diagnosis not present

## 2019-06-09 DIAGNOSIS — R278 Other lack of coordination: Secondary | ICD-10-CM | POA: Diagnosis not present

## 2019-06-09 DIAGNOSIS — R293 Abnormal posture: Secondary | ICD-10-CM | POA: Diagnosis not present

## 2019-06-09 DIAGNOSIS — F0150 Vascular dementia without behavioral disturbance: Secondary | ICD-10-CM | POA: Diagnosis not present

## 2019-06-09 DIAGNOSIS — R1311 Dysphagia, oral phase: Secondary | ICD-10-CM | POA: Diagnosis not present

## 2019-06-10 DIAGNOSIS — R1311 Dysphagia, oral phase: Secondary | ICD-10-CM | POA: Diagnosis not present

## 2019-06-10 DIAGNOSIS — F0150 Vascular dementia without behavioral disturbance: Secondary | ICD-10-CM | POA: Diagnosis not present

## 2019-06-10 DIAGNOSIS — R293 Abnormal posture: Secondary | ICD-10-CM | POA: Diagnosis not present

## 2019-06-10 DIAGNOSIS — R4189 Other symptoms and signs involving cognitive functions and awareness: Secondary | ICD-10-CM | POA: Diagnosis not present

## 2019-06-10 DIAGNOSIS — R278 Other lack of coordination: Secondary | ICD-10-CM | POA: Diagnosis not present

## 2019-06-10 DIAGNOSIS — M6389 Disorders of muscle in diseases classified elsewhere, multiple sites: Secondary | ICD-10-CM | POA: Diagnosis not present

## 2019-06-10 DIAGNOSIS — I69890 Apraxia following other cerebrovascular disease: Secondary | ICD-10-CM | POA: Diagnosis not present

## 2019-06-10 DIAGNOSIS — F0280 Dementia in other diseases classified elsewhere without behavioral disturbance: Secondary | ICD-10-CM | POA: Diagnosis not present

## 2019-06-11 DIAGNOSIS — R293 Abnormal posture: Secondary | ICD-10-CM | POA: Diagnosis not present

## 2019-06-11 DIAGNOSIS — F0150 Vascular dementia without behavioral disturbance: Secondary | ICD-10-CM | POA: Diagnosis not present

## 2019-06-11 DIAGNOSIS — R1311 Dysphagia, oral phase: Secondary | ICD-10-CM | POA: Diagnosis not present

## 2019-06-11 DIAGNOSIS — R278 Other lack of coordination: Secondary | ICD-10-CM | POA: Diagnosis not present

## 2019-06-11 DIAGNOSIS — M6389 Disorders of muscle in diseases classified elsewhere, multiple sites: Secondary | ICD-10-CM | POA: Diagnosis not present

## 2019-06-11 DIAGNOSIS — R4189 Other symptoms and signs involving cognitive functions and awareness: Secondary | ICD-10-CM | POA: Diagnosis not present

## 2019-06-12 DIAGNOSIS — F0150 Vascular dementia without behavioral disturbance: Secondary | ICD-10-CM | POA: Diagnosis not present

## 2019-06-12 DIAGNOSIS — R278 Other lack of coordination: Secondary | ICD-10-CM | POA: Diagnosis not present

## 2019-06-12 DIAGNOSIS — R4189 Other symptoms and signs involving cognitive functions and awareness: Secondary | ICD-10-CM | POA: Diagnosis not present

## 2019-06-12 DIAGNOSIS — R293 Abnormal posture: Secondary | ICD-10-CM | POA: Diagnosis not present

## 2019-06-12 DIAGNOSIS — M6389 Disorders of muscle in diseases classified elsewhere, multiple sites: Secondary | ICD-10-CM | POA: Diagnosis not present

## 2019-06-12 DIAGNOSIS — R1311 Dysphagia, oral phase: Secondary | ICD-10-CM | POA: Diagnosis not present

## 2019-06-16 DIAGNOSIS — F0150 Vascular dementia without behavioral disturbance: Secondary | ICD-10-CM | POA: Diagnosis not present

## 2019-06-16 DIAGNOSIS — R293 Abnormal posture: Secondary | ICD-10-CM | POA: Diagnosis not present

## 2019-06-16 DIAGNOSIS — R4189 Other symptoms and signs involving cognitive functions and awareness: Secondary | ICD-10-CM | POA: Diagnosis not present

## 2019-06-16 DIAGNOSIS — M6389 Disorders of muscle in diseases classified elsewhere, multiple sites: Secondary | ICD-10-CM | POA: Diagnosis not present

## 2019-06-16 DIAGNOSIS — R278 Other lack of coordination: Secondary | ICD-10-CM | POA: Diagnosis not present

## 2019-06-16 DIAGNOSIS — R1311 Dysphagia, oral phase: Secondary | ICD-10-CM | POA: Diagnosis not present

## 2019-06-17 ENCOUNTER — Non-Acute Institutional Stay (SKILLED_NURSING_FACILITY): Payer: Medicare Other | Admitting: Internal Medicine

## 2019-06-17 ENCOUNTER — Encounter: Payer: Self-pay | Admitting: Internal Medicine

## 2019-06-17 DIAGNOSIS — N183 Chronic kidney disease, stage 3 unspecified: Secondary | ICD-10-CM

## 2019-06-17 DIAGNOSIS — F0280 Dementia in other diseases classified elsewhere without behavioral disturbance: Secondary | ICD-10-CM

## 2019-06-17 DIAGNOSIS — G309 Alzheimer's disease, unspecified: Secondary | ICD-10-CM

## 2019-06-17 DIAGNOSIS — Z7189 Other specified counseling: Secondary | ICD-10-CM

## 2019-06-17 DIAGNOSIS — F0150 Vascular dementia without behavioral disturbance: Secondary | ICD-10-CM

## 2019-06-17 DIAGNOSIS — E119 Type 2 diabetes mellitus without complications: Secondary | ICD-10-CM | POA: Diagnosis not present

## 2019-06-17 DIAGNOSIS — G4733 Obstructive sleep apnea (adult) (pediatric): Secondary | ICD-10-CM | POA: Diagnosis not present

## 2019-06-17 DIAGNOSIS — Z0000 Encounter for general adult medical examination without abnormal findings: Secondary | ICD-10-CM

## 2019-06-17 DIAGNOSIS — M170 Bilateral primary osteoarthritis of knee: Secondary | ICD-10-CM | POA: Diagnosis not present

## 2019-06-17 DIAGNOSIS — I129 Hypertensive chronic kidney disease with stage 1 through stage 4 chronic kidney disease, or unspecified chronic kidney disease: Secondary | ICD-10-CM | POA: Diagnosis not present

## 2019-06-17 DIAGNOSIS — F329 Major depressive disorder, single episode, unspecified: Secondary | ICD-10-CM | POA: Diagnosis not present

## 2019-06-17 NOTE — Progress Notes (Signed)
Patient ID: [name], male   DOB: October 15, 1940, 78 y.o.   MRN: [id number]  Provider:  Rexene Edison. Mariea Clonts, D.O., C.M.D. Location: [facility] Room Number: 120 Place of Service:  SNF (31)   PCP: Gayland Curry, DO Patient Care Team: Gayland Curry, DO as PCP - General (Geriatric Medicine)  Extended Emergency Contact Information Primary Emergency Contact: [name],[name] Address: [address]          [place], [place] [address] [name] of [place] Phone: [phone] Mobile Phone: [phone] Relation: Spouse  Code Status: DNR Goals of Care: Advanced Directive information Advanced Directives 12/17/2018  Does Patient Have a Medical Advance Directive? Yes  Type of Paramedic of [place];Living will;Out of facility DNR (pink MOST or yellow form)  Does patient want to make changes to medical advance directive? No - Patient declined  Copy of [place] in Chart? Yes - validated most recent copy scanned in chart (See row information)  Would patient like information on creating a medical advance directive? -  Pre-existing out of facility DNR order (yellow form or pink MOST form) Yellow form placed in chart (order not valid for inpatient use)     Chief Complaint  Patient presents with  . Annual Exam    CPE    HPI: Patient is a 78 y.o. male seen today for an annual comprehensive examination in SNF.  [name] has good and bad days per nursing and even over routine visits with him--some days he's more verbal while others, he is quiet.  He outright denied pain for me today, but did not otherwise speak clearly when asked other questions.  He now has a football that serves as a brace to keep his feet from moving too close together as suggested by therapy.  He continues to use a high-backed manual wheelchair and is transferred via hoyer lift.  No recent difficulty with behaviors mentioned.  He is sleeping well at  night.  Upon review of nursing notes, I see that he's been hitting staff during CPAP application on a couple of occasions and it looks like he detached the hose three times one night.  Reviewing his po intake--recently, he's been eating more at lunch and supper.  He eats very little at breakfast for some time now.  He's lost 8.6 lbs in the past month and 22 lbs over the past year.  Diet is Dysphagia 3 chopped, thin liquids; Double portions.  Last February, he scored 4/30 on his MMSE down from 16/30 2 years before.  He is no longer on memory medications.    He remains on zoloft 100mg  daily at bedtime for his depression.  Would not change with covid isolation.  DMII:   Lab Results  Component Value Date   HGBA1C 6.0 05/16/2019  remains on metformin which he may no longer need with his weight loss.  Takes with breakfast which he hardly eats any of typically (25% often).  On ace and baby asa.    Doesn't get CBGs.  He's had no recent gout flares.  He's on allopurinol.  OA of knees seems controlled with his tylenol bid.  No longer has voltaren gel--was refusing sometimes.  SBP is running in the 110s to 136 max over the past month.    Spoke with his wife, [name].  She is frustrated about the social isolation due to covid-19.  She notes that [name] has gone down terribly amid this.  She is  grateful especially for his regular caregiver, [name], who will bring him outside which he loves.  We talked about his weight loss and changes in eating habits--he historically loves his breakfast, but now eats just 25-50% or less at times at that meal.  He eats slowly and sometimes goes to sleep during meals.  [name] has asked that he not be interrupted overnight to get better rest.  He also has had some knee pain at night for which [name] applies biofreeze for him.  I brought up completion of the MOST form.  [name] mentions that if [name] were to get covid, she would not want him treated.  He's been the kindest, most patient man and  she's knows he would not want to live like this and linger.  He's been clear about this when they took care of their parents.    Past Medical History:  Diagnosis Date  . Abnormality of gait 04/05/2015  . Benign prostatic hypertrophy   . Dementia ([place])   . Diabetes mellitus type II    no meds  . Elevated PSA   . Erectile dysfunction   . Gout   . Hereditary and idiopathic peripheral neuropathy 08/02/2016  . Hypertension   . Memory changes   . Memory difficulties 04/05/2015  . Nephrolithiasis    hx of  . Obstructive sleep apnea   . Partial Achilles tendon tear   . Rosacea, acne   . Tremor 04/05/2015   Past Surgical History:  Procedure Laterality Date  . CARPAL TUNNEL RELEASE  2004  . CARPAL TUNNEL RELEASE  10/31/2011   Procedure: CARPAL TUNNEL RELEASE;  Surgeon: Cammie Sickle., MD;  Location: [facility];  Service: Orthopedics;  Laterality: Left;  . EYE SURGERY     cataracts  . HAND SURGERY  2007   right hand  . INGUINAL HERNIA REPAIR    . JOINT REPLACEMENT     left total knee  . KNEE SURGERY  1961   left knee  . TONSILLECTOMY    . TONSILLECTOMY AND ADENOIDECTOMY    . TRANSURETHRAL RESECTION OF PROSTATE      reports that he has never smoked. He has never used smokeless tobacco. He reports that he does not drink alcohol or use drugs. Social History   Socioeconomic History  . Marital status: Married    Spouse name: Not on file  . Number of children: 1  . Years of education: MBA  . Highest education level: Not on file  Occupational History    Employer: RETIRED  Social Needs  . Financial resource strain: Not hard at all  . Food insecurity    Worry: Never true    Inability: Never true  . Transportation needs    Medical: No    Non-medical: No  Tobacco Use  . Smoking status: Never Smoker  . Smokeless tobacco: Never Used  Substance and Sexual Activity  . Alcohol use: No    Comment: rarely  . Drug use: No  . Sexual activity: Not on file   Lifestyle  . Physical activity    Days per week: 0 days    Minutes per session: 0 min  . Stress: Not on file  Relationships  . Social Herbalist on phone: Not on file    Gets together: Not on file    Attends religious service: Not on file    Active member of club or organization: Not on file    Attends meetings of clubs  or organizations: Not on file    Relationship status: Not on file  Other Topics Concern  . Not on file  Social History Narrative   Regular exercise.    Avoid Ibuprofen, naproxen, NSAIDS.   Designated Party Release signed on 03/03/2010.      Lives at [facility] w/ his wife   Patient drinks about 4-5 cups of caffeine daily.   Patient is right handed.   Family History  Problem Relation Age of Onset  . Heart disease Mother   . Dementia Sister   . COPD Sister   . Diabetes Sister   . Thalassemia Son   . Heart disease Paternal Grandfather   . Dementia Cousin   . Colon cancer Neg Hx   . Stomach cancer Neg Hx     Pertinent  Health Maintenance Due  Topic Date Due  . INFLUENZA VACCINE  05/10/2019  . HEMOGLOBIN A1C  11/16/2019  . FOOT EXAM  04/16/2020  . PNA vac Low Risk Adult  Completed  . OPHTHALMOLOGY EXAM  Discontinued  . COLONOSCOPY  Discontinued   Fall Risk  01/10/2019 04/30/2018  Falls in the past year? 0 No  Number falls in past yr: 0 -  Injury with Fall? 0 -  Risk for fall due to : Impaired balance/gait;Mental status change -  Follow up Falls evaluation completed -   No flowsheet data found.  Functional Status Survey:    Allergies  Allergen Reactions  . Ciprofloxacin     REACTION: Diarrhea,l rectal irritation    Outpatient Encounter Medications as of 06/17/2019  Medication Sig  . acetaminophen (TYLENOL) 500 MG tablet Take 500 mg by mouth 2 (two) times daily.  Marland Kitchen allopurinol (ZYLOPRIM) 100 MG tablet Take 100 mg by mouth daily.   Marland Kitchen amLODipine (NORVASC) 10 MG tablet Take 10 mg by mouth daily.  Marland Kitchen aspirin 81 MG tablet Take 81 mg by  mouth daily.  . carboxymethylcellulose (REFRESH PLUS) 0.5 % SOLN 2 drops 2 (two) times daily as needed.  . Cholecalciferol (VITAMIN D-3 PO) Take 2,000 Units by mouth daily.   . Eyelid Cleansers (OCUSOFT EYELID CLEANSING) PADS Apply topically every morning.  . finasteride (PROSCAR) 5 MG tablet Take 5 mg by mouth at bedtime.   . hydrocortisone 2.5 % cream Apply 1 application topically as needed.  Marland Kitchen lisinopril (PRINIVIL,ZESTRIL) 40 MG tablet Take 40 mg by mouth daily.   . metFORMIN (GLUCOPHAGE) 500 MG tablet Take 500 mg by mouth daily with breakfast.  . pantoprazole (PROTONIX) 40 MG tablet Take 40 mg by mouth daily.   . sertraline (ZOLOFT) 100 MG tablet Take 100 mg by mouth at bedtime.  . [DISCONTINUED] diclofenac sodium (VOLTAREN) 1 % GEL Apply 2 g topically daily as needed.   No facility-administered encounter medications on file as of 06/17/2019.     Review of Systems  Constitutional: Positive for appetite change, fatigue and unexpected weight change. Negative for activity change, chills and fever.  HENT: Positive for trouble swallowing. Negative for congestion.        Has chopped meats and can eat finger foods  Eyes: Negative for visual disturbance.  Respiratory: Negative for cough, choking and shortness of breath.   Cardiovascular: Negative for chest pain, palpitations and leg swelling.  Gastrointestinal: Negative for abdominal pain, blood in stool, constipation, diarrhea, nausea and vomiting.  Genitourinary: Negative for dysuria.  Musculoskeletal: Positive for arthralgias, gait problem and joint swelling. Negative for back pain.  Skin: Negative for color change and pallor.  Vitals:   06/17/19 0945  Weight: 241 lb (109.3 kg)  Height: 6\' 3"  (1.905 m)   Body mass index is 30.12 kg/m. Physical Exam Constitutional:      General: He is not in acute distress.    Appearance: Normal appearance. He is not ill-appearing, toxic-appearing or diaphoretic.  HENT:     Head: Normocephalic  and atraumatic.     Right Ear: External ear normal.     Left Ear: External ear normal.     Nose: Nose normal.     Comments: rhinophyma    Mouth/Throat:     Pharynx: Oropharynx is clear.  Eyes:     Extraocular Movements: Extraocular movements intact.     Conjunctiva/sclera: Conjunctivae normal.     Pupils: Pupils are equal, round, and reactive to light.  Neck:     Musculoskeletal: Neck supple.  Cardiovascular:     Rate and Rhythm: Normal rate and regular rhythm.     Pulses: Normal pulses.  Pulmonary:     Effort: Pulmonary effort is normal.     Breath sounds: Normal breath sounds.     Comments: Could not follow command for deep breaths today--typically does Abdominal:     General: Bowel sounds are normal. There is no distension.     Palpations: Abdomen is soft. There is no mass.     Tenderness: There is no abdominal tenderness. There is no guarding or rebound.  Musculoskeletal:        General: No tenderness.     Right lower leg: No edema.     Left lower leg: No edema.     Comments: Knees not tender at present; football b/w feet to keep from contracting together  Skin:    General: Skin is warm and dry.     Capillary Refill: Capillary refill takes less than 2 seconds.  Neurological:     General: No focal deficit present.     Mental Status: He is alert.     Cranial Nerves: No cranial nerve deficit.     Motor: Weakness present.     Gait: Gait abnormal.     Comments: Uses manual wheelchair, hoyer  Psychiatric:     Comments: Does smile at me, spoke only a few words today--No! To do you have pain? And some unclear words to my other questions     Labs reviewed: Basic Metabolic Panel: Recent Labs    11/22/18  NA 140  K 4.1  BUN 24*  CREATININE 1.5*   Liver Function Tests: No results for input(s): AST, ALT, ALKPHOS, BILITOT, PROT, ALBUMIN in the last 8760 hours. No results for input(s): LIPASE, AMYLASE in the last 8760 hours. No results for input(s): AMMONIA in the last  8760 hours. CBC: Recent Labs    11/22/18  WBC 7.0  HGB 11.5*  HCT 35*  PLT 191   Cardiac Enzymes: No results for input(s): CKTOTAL, CKMB, CKMBINDEX, TROPONINI in the last 8760 hours. BNP: Invalid input(s): POCBNP Lab Results  Component Value Date   HGBA1C 6.0 05/16/2019   Lab Results  Component Value Date   TSH 1.74 11/22/2018   Lab Results  Component Value Date   VITAMINB12 446 04/05/2015   Assessment/Plan 1. Mixed Alzheimer's and vascular dementia ([place]) -progressing and at a more rapid rate amid covid isolation, unfortunately -less verbal and has agitation about wearing cpap, intake declining  2. Annual physical exam -performed today  3. Benign hypertension with chronic kidney disease, stage III ([place]) -bp has been good lately -might  consider reducing or stopping one of his bp meds next time (likely amlodipine since lisinopril has dual purpose)  4. Severe obstructive sleep apnea -remains on CPAP, but removing it sometimes and also fighting staff at times when applied--not clear if due to resident not remembering why he must wear it so it's uncomfortable, approach, etc?   --appears this is not with all staff  5. Chronic kidney disease, stage III (moderate) ([facility]) -last creatinine 1.5 -Avoid nephrotoxic agents like nsaids, dose adjust renally excreted meds, hydrate.  6. Diabetes mellitus type 2 without retinopathy ([place]) -hba1c was 6 and intake down -will d/c metformin to see if appetite improves or if related to dementia progression or time spent feeding/assisting  7. Reactive depression -continue zoloft, he's socially isolated and only sees staff and rare visits with his wife due to covid-19  8. Primary osteoarthritis of both knees -left knee bothersome sometimes at night, but relieved with proper positioning and biofreeze; also on tylenol for neck, back and knee pain -might consider ice for the knee if notably swollen when he c/o pain/winces  9.   ACP -initiated discussion of MOST form with [name] -she will take a look at form and review when she sees [name] tomorrow -I'm happy to talk with her to complete the form or it can be done with NP or social worker, then uploaded -[name] says [name] would not have wanted to live like this and linger -does have DNR order in place -17 minutes spent on ACP  Family/ staff Communication: discussed with SNF nurse and pt's wife  Labs/tests ordered:  No new today  Bajandas L. Tyshae Stair, D.O. [facility] [address], [place] [address] Cell Phone (Mon-Fri 8am-5pm):  [phone] On Call:  [phone] & follow prompts after 5pm & weekends Office Phone:  [phone] Office Fax:  [phone]

## 2019-06-18 DIAGNOSIS — R4189 Other symptoms and signs involving cognitive functions and awareness: Secondary | ICD-10-CM | POA: Diagnosis not present

## 2019-06-18 DIAGNOSIS — R278 Other lack of coordination: Secondary | ICD-10-CM | POA: Diagnosis not present

## 2019-06-18 DIAGNOSIS — F0150 Vascular dementia without behavioral disturbance: Secondary | ICD-10-CM | POA: Diagnosis not present

## 2019-06-18 DIAGNOSIS — R293 Abnormal posture: Secondary | ICD-10-CM | POA: Diagnosis not present

## 2019-06-18 DIAGNOSIS — R1311 Dysphagia, oral phase: Secondary | ICD-10-CM | POA: Diagnosis not present

## 2019-06-18 DIAGNOSIS — M6389 Disorders of muscle in diseases classified elsewhere, multiple sites: Secondary | ICD-10-CM | POA: Diagnosis not present

## 2019-06-19 DIAGNOSIS — R1311 Dysphagia, oral phase: Secondary | ICD-10-CM | POA: Diagnosis not present

## 2019-06-19 DIAGNOSIS — R293 Abnormal posture: Secondary | ICD-10-CM | POA: Diagnosis not present

## 2019-06-19 DIAGNOSIS — R278 Other lack of coordination: Secondary | ICD-10-CM | POA: Diagnosis not present

## 2019-06-19 DIAGNOSIS — M6389 Disorders of muscle in diseases classified elsewhere, multiple sites: Secondary | ICD-10-CM | POA: Diagnosis not present

## 2019-06-19 DIAGNOSIS — F0150 Vascular dementia without behavioral disturbance: Secondary | ICD-10-CM | POA: Diagnosis not present

## 2019-06-19 DIAGNOSIS — R4189 Other symptoms and signs involving cognitive functions and awareness: Secondary | ICD-10-CM | POA: Diagnosis not present

## 2019-06-20 DIAGNOSIS — M6389 Disorders of muscle in diseases classified elsewhere, multiple sites: Secondary | ICD-10-CM | POA: Diagnosis not present

## 2019-06-20 DIAGNOSIS — R1311 Dysphagia, oral phase: Secondary | ICD-10-CM | POA: Diagnosis not present

## 2019-06-20 DIAGNOSIS — R4189 Other symptoms and signs involving cognitive functions and awareness: Secondary | ICD-10-CM | POA: Diagnosis not present

## 2019-06-20 DIAGNOSIS — R278 Other lack of coordination: Secondary | ICD-10-CM | POA: Diagnosis not present

## 2019-06-20 DIAGNOSIS — R293 Abnormal posture: Secondary | ICD-10-CM | POA: Diagnosis not present

## 2019-06-20 DIAGNOSIS — F0150 Vascular dementia without behavioral disturbance: Secondary | ICD-10-CM | POA: Diagnosis not present

## 2019-06-23 DIAGNOSIS — R4189 Other symptoms and signs involving cognitive functions and awareness: Secondary | ICD-10-CM | POA: Diagnosis not present

## 2019-06-23 DIAGNOSIS — R278 Other lack of coordination: Secondary | ICD-10-CM | POA: Diagnosis not present

## 2019-06-23 DIAGNOSIS — E119 Type 2 diabetes mellitus without complications: Secondary | ICD-10-CM | POA: Diagnosis not present

## 2019-06-23 DIAGNOSIS — D3132 Benign neoplasm of left choroid: Secondary | ICD-10-CM | POA: Diagnosis not present

## 2019-06-23 DIAGNOSIS — R293 Abnormal posture: Secondary | ICD-10-CM | POA: Diagnosis not present

## 2019-06-23 DIAGNOSIS — F0150 Vascular dementia without behavioral disturbance: Secondary | ICD-10-CM | POA: Diagnosis not present

## 2019-06-23 DIAGNOSIS — R1311 Dysphagia, oral phase: Secondary | ICD-10-CM | POA: Diagnosis not present

## 2019-06-23 DIAGNOSIS — M6389 Disorders of muscle in diseases classified elsewhere, multiple sites: Secondary | ICD-10-CM | POA: Diagnosis not present

## 2019-06-23 LAB — HM DIABETES EYE EXAM

## 2019-06-24 DIAGNOSIS — R278 Other lack of coordination: Secondary | ICD-10-CM | POA: Diagnosis not present

## 2019-06-24 DIAGNOSIS — R293 Abnormal posture: Secondary | ICD-10-CM | POA: Diagnosis not present

## 2019-06-24 DIAGNOSIS — R4189 Other symptoms and signs involving cognitive functions and awareness: Secondary | ICD-10-CM | POA: Diagnosis not present

## 2019-06-24 DIAGNOSIS — R1311 Dysphagia, oral phase: Secondary | ICD-10-CM | POA: Diagnosis not present

## 2019-06-24 DIAGNOSIS — F0150 Vascular dementia without behavioral disturbance: Secondary | ICD-10-CM | POA: Diagnosis not present

## 2019-06-24 DIAGNOSIS — M6389 Disorders of muscle in diseases classified elsewhere, multiple sites: Secondary | ICD-10-CM | POA: Diagnosis not present

## 2019-06-25 ENCOUNTER — Encounter: Payer: Self-pay | Admitting: *Deleted

## 2019-06-26 DIAGNOSIS — R293 Abnormal posture: Secondary | ICD-10-CM | POA: Diagnosis not present

## 2019-06-26 DIAGNOSIS — F0150 Vascular dementia without behavioral disturbance: Secondary | ICD-10-CM | POA: Diagnosis not present

## 2019-06-26 DIAGNOSIS — R4189 Other symptoms and signs involving cognitive functions and awareness: Secondary | ICD-10-CM | POA: Diagnosis not present

## 2019-06-26 DIAGNOSIS — R278 Other lack of coordination: Secondary | ICD-10-CM | POA: Diagnosis not present

## 2019-06-26 DIAGNOSIS — R1311 Dysphagia, oral phase: Secondary | ICD-10-CM | POA: Diagnosis not present

## 2019-06-26 DIAGNOSIS — M6389 Disorders of muscle in diseases classified elsewhere, multiple sites: Secondary | ICD-10-CM | POA: Diagnosis not present

## 2019-06-29 ENCOUNTER — Telehealth: Payer: Self-pay | Admitting: Nurse Practitioner

## 2019-06-29 NOTE — Telephone Encounter (Signed)
Spoke with several nurses today regarding [name]. Pt with hx of BPH and follows with urologist Dr Rosana Hoes. Nurse reported that last week had an episode with frank blood in his urine but this went away. Today they noted that he had dark urine, they scanned his bladder after his morning urinate and noted 700 cc. Temp 102. Reports he had an elevated temp and has placed him on isolation and plan to COVID test him. Other than elevated temperature his VSS. No abnormal lung sounds, cough or congestion. RR and O2 sats were stable. He is non-tender to abdomen with good bowel sounds.  Ordered UA C&S from I & O cath, cbc with diff and bmp. They were only able to get out about 90 cc and then he started voiding and "push the catheter out" nursing then scanned his bladder and there was ~600 CC left in his bladder and they could not re-cath him due to his enlarged prostate.  attempted again after 1 hour and was successful. Urine was dark and cloudy. Planning on touching base with urologist office as well. He is currently on proscar 5 mg daily.  Reports he is weak and refused to take medications this evening BP 140/66, pulse 73, O2 sats 94% Ra, temp 98.8, RR 20.  Urine is dark looking like old blood. Decreased PO intake and fluids today. IV NS at 75 cc an hour x 1 liter order with rocephin 2 gm every 24 hours x 3 days or until urine culture resulted recommended but she (POA, [employer]) would like to wait and talk to [place], NP or Dr Mariea Clonts before she does anything (IV fluids or antibiotics). She is also working on his MOST form.  Can you please follow up with pt when you are at facility.  Thank you, Vassie Moment to [place] and Dr Mariea Clonts

## 2019-06-30 ENCOUNTER — Non-Acute Institutional Stay (SKILLED_NURSING_FACILITY): Payer: Medicare Other | Admitting: Adult Health

## 2019-06-30 ENCOUNTER — Encounter: Payer: Self-pay | Admitting: Adult Health

## 2019-06-30 DIAGNOSIS — N3001 Acute cystitis with hematuria: Secondary | ICD-10-CM | POA: Diagnosis not present

## 2019-06-30 DIAGNOSIS — F0150 Vascular dementia without behavioral disturbance: Secondary | ICD-10-CM | POA: Diagnosis not present

## 2019-06-30 DIAGNOSIS — G309 Alzheimer's disease, unspecified: Secondary | ICD-10-CM

## 2019-06-30 DIAGNOSIS — N401 Enlarged prostate with lower urinary tract symptoms: Secondary | ICD-10-CM | POA: Diagnosis not present

## 2019-06-30 DIAGNOSIS — Z87442 Personal history of urinary calculi: Secondary | ICD-10-CM | POA: Diagnosis not present

## 2019-06-30 DIAGNOSIS — F0280 Dementia in other diseases classified elsewhere without behavioral disturbance: Secondary | ICD-10-CM | POA: Diagnosis not present

## 2019-06-30 DIAGNOSIS — N390 Urinary tract infection, site not specified: Secondary | ICD-10-CM | POA: Diagnosis not present

## 2019-06-30 DIAGNOSIS — D649 Anemia, unspecified: Secondary | ICD-10-CM | POA: Diagnosis not present

## 2019-06-30 DIAGNOSIS — Z20828 Contact with and (suspected) exposure to other viral communicable diseases: Secondary | ICD-10-CM | POA: Diagnosis not present

## 2019-06-30 DIAGNOSIS — R319 Hematuria, unspecified: Secondary | ICD-10-CM | POA: Diagnosis not present

## 2019-06-30 LAB — CBC AND DIFFERENTIAL
HCT: 32 — AB (ref 41–53)
Hemoglobin: 10.6 — AB (ref 13.5–17.5)
Platelets: 200 (ref 150–399)
WBC: 16.5

## 2019-06-30 LAB — BASIC METABOLIC PANEL
BUN: 46 — AB (ref 4–21)
Creatinine: 2.2 — AB (ref 0.6–1.3)
Glucose: 96
Potassium: 4.4 (ref 3.4–5.3)
Sodium: 144 (ref 137–147)

## 2019-06-30 LAB — NOVEL CORONAVIRUS, NAA: SARS-CoV-2, NAA: NEGATIVE

## 2019-06-30 LAB — HM DIABETES FOOT EXAM

## 2019-06-30 NOTE — Progress Notes (Signed)
Location:  Occupational psychologist of Service:  SNF (31) Provider:   Cindi Carbon, ANP [place] [phone]   Gayland Curry, DO  Patient Care Team: Gayland Curry, DO as PCP - General (Geriatric Medicine)  Extended Emergency Contact Information Primary Emergency Contact: Bartko,[name] Address: [address]          [place], [place] [address] [name] of [place] Phone: [phone] Mobile Phone: [phone] Relation: Spouse  Code Status:  DNR Goals of care: Advanced Directive information Advanced Directives 12/17/2018  Does Patient Have a Medical Advance Directive? Yes  Type of Paramedic of [place];Living will;Out of facility DNR (pink MOST or yellow form)  Does patient want to make changes to medical advance directive? No - Patient declined  Copy of [place] in Chart? Yes - validated most recent copy scanned in chart (See row information)  Would patient like information on creating a medical advance directive? -  Pre-existing out of facility DNR order (yellow form or pink MOST form) Yellow form placed in chart (order not valid for inpatient use)     Chief Complaint  Patient presents with  . Acute Visit    fever     HPI:  Pt is a 78 y.o. male seen today for fever. He resides at [facility] in the skilled living section due to severe dementia. He requires assistance with all ADLs and is minimally verbal. He has a hx of CKD, HTN, DM II, nephrolithiasis, BPH, UTI, gout, TIA, among others.   On 9/20 he developed a fever of 102. He was placed on isolation and covid swab obtained which is still pending. He does not have cough, congestion, sob, or other associated symptoms. H was noted to have 700cc of urine in his bladder upon performing a bladder scan. A foley was placed. Urine in the bag has blood. UA 9/21: 3+ blood, RBC TNTC, 1+bacteria, WBC 51-100, protein 2+    He has not had a fever  since that time. He ate breakfast and took his meds as usual but is somewhat lethargic and flat. His wife was called yesterday as the oncall [facility] provider had ordered Rocephin and IVF but she declined due to his poor quality of life. She wanted to speak to me or Dr. Mariea Clonts prior to making a decision.    Past Medical History:  Diagnosis Date  . Abnormality of gait 04/05/2015  . Benign prostatic hypertrophy   . Dementia ([place])   . Diabetes mellitus type II    no meds  . Elevated PSA   . Erectile dysfunction   . Gout   . Hereditary and idiopathic peripheral neuropathy 08/02/2016  . Hypertension   . Memory changes   . Memory difficulties 04/05/2015  . Nephrolithiasis    hx of  . Obstructive sleep apnea   . Partial Achilles tendon tear   . Rosacea, acne   . Tremor 04/05/2015   Past Surgical History:  Procedure Laterality Date  . CARPAL TUNNEL RELEASE  2004  . CARPAL TUNNEL RELEASE  10/31/2011   Procedure: CARPAL TUNNEL RELEASE;  Surgeon: Cammie Sickle., MD;  Location: [place];  Service: Orthopedics;  Laterality: Left;  . EYE SURGERY     cataracts  . HAND SURGERY  2007   right hand  . INGUINAL HERNIA REPAIR    . JOINT REPLACEMENT     left total knee  . [place]  left knee  . TONSILLECTOMY    . TONSILLECTOMY AND ADENOIDECTOMY    . TRANSURETHRAL RESECTION OF PROSTATE      Allergies  Allergen Reactions  . Ciprofloxacin     REACTION: Diarrhea,l rectal irritation    Outpatient Encounter Medications as of 06/30/2019  Medication Sig  . acetaminophen (TYLENOL) 500 MG tablet Take 500 mg by mouth 2 (two) times daily.  Marland Kitchen allopurinol (ZYLOPRIM) 100 MG tablet Take 100 mg by mouth daily.   Marland Kitchen amLODipine (NORVASC) 10 MG tablet Take 10 mg by mouth daily.  Marland Kitchen aspirin 81 MG tablet Take 81 mg by mouth daily.  . carboxymethylcellulose (REFRESH PLUS) 0.5 % SOLN 2 drops 2 (two) times daily as needed.  . Cholecalciferol (VITAMIN D-3 PO) Take 2,000 Units by mouth  daily.   . Eyelid Cleansers (OCUSOFT EYELID CLEANSING) PADS Apply topically every morning.  . finasteride (PROSCAR) 5 MG tablet Take 5 mg by mouth at bedtime.   . hydrocortisone 2.5 % cream Apply 1 application topically as needed.  Marland Kitchen lisinopril (PRINIVIL,ZESTRIL) 40 MG tablet Take 40 mg by mouth daily.   . pantoprazole (PROTONIX) 40 MG tablet Take 40 mg by mouth daily.   . sertraline (ZOLOFT) 100 MG tablet Take 100 mg by mouth at bedtime.   No facility-administered encounter medications on file as of 06/30/2019.     Review of Systems  Unable to perform ROS: Dementia    Immunization History  Administered Date(s) Administered  . Influenza Split 08/18/2011, 08/08/2012  . Influenza Whole 08/21/2006, 07/11/2007, 09/13/2009, 07/13/2010  . Influenza, Seasonal, Injecte, Preservative Fre 07/10/2013  . Influenza,inj,Quad PF,6+ Mos 07/30/2018  . Influenza-Unspecified 08/08/2012, 07/17/2014, 08/10/2014, 06/16/2015, 07/30/2017  . Pneumococcal Conjugate-13 01/06/2014  . Pneumococcal Polysaccharide-23 03/12/2012, 03/10/2014  . Td 10/09/1992  . Tdap 03/12/2012, 01/08/2014  . Zoster 12/26/2007, 03/10/2014  . Zoster Recombinat (Shingrix) 01/23/2018   Pertinent  Health Maintenance Due  Topic Date Due  . INFLUENZA VACCINE  05/10/2019  . HEMOGLOBIN A1C  11/16/2019  . FOOT EXAM  04/16/2020  . PNA vac Low Risk Adult  Completed  . OPHTHALMOLOGY EXAM  Discontinued  . COLONOSCOPY  Discontinued   Fall Risk  01/10/2019 04/30/2018  Falls in the past year? 0 No  Number falls in past yr: 0 -  Injury with Fall? 0 -  Risk for fall due to : Impaired balance/gait;Mental status change -  Follow up Falls evaluation completed -   Functional Status Survey:    Vitals:   06/30/19 1117  Resp: 20  Temp: 98.7 F (37.1 C)  SpO2: 97%   There is no height or weight on file to calculate BMI. Physical Exam Vitals signs and nursing note reviewed.  Constitutional:      General: He is not in acute distress.     Appearance: He is not diaphoretic.     Comments: Awake but appears slightly lethargic and weak  HENT:     Head: Normocephalic and atraumatic.     Nose: Nose normal. No congestion.     Mouth/Throat:     Comments: Refused oral exam Eyes:     General:        Right eye: No discharge.        Left eye: No discharge.     Conjunctiva/sclera: Conjunctivae normal.     Pupils: Pupils are equal, round, and reactive to light.  Neck:     Thyroid: No thyromegaly.     Vascular: No JVD.     Trachea: No tracheal deviation.  Cardiovascular:     Rate and Rhythm: Normal rate and regular rhythm.     Heart sounds: No murmur.  Pulmonary:     Effort: Pulmonary effort is normal. No respiratory distress.     Breath sounds: Normal breath sounds. No wheezing.  Abdominal:     General: Bowel sounds are normal. There is no distension.     Palpations: Abdomen is soft.     Tenderness: There is no abdominal tenderness. There is no right CVA tenderness or left CVA tenderness.  Genitourinary:    Comments: Foley with small amt of urine, blood tinged Musculoskeletal:     Right lower leg: No edema.     Left lower leg: No edema.  Lymphadenopathy:     Cervical: No cervical adenopathy.  Skin:    General: Skin is warm and dry.  Neurological:     General: No focal deficit present.     Comments: Not able to f/c and not verbal which is his baseline.   Psychiatric:     Comments: flat     Labs reviewed: Recent Labs    11/22/18  NA 140  K 4.1  BUN 24*  CREATININE 1.5*   No results for input(s): AST, ALT, ALKPHOS, BILITOT, PROT, ALBUMIN in the last 8760 hours. Recent Labs    11/22/18 06/30/19  WBC 7.0 16.5  HGB 11.5* 10.6*  HCT 35* 32*  PLT 191 200   Lab Results  Component Value Date   TSH 1.74 11/22/2018   Lab Results  Component Value Date   HGBA1C 6.0 05/16/2019   Lab Results  Component Value Date   CHOL 151 11/11/2013   HDL 33.00 (L) 11/11/2013   LDLCALC 96 11/11/2013   LDLDIRECT 97.8  05/26/2009   TRIG 112.0 11/11/2013   CHOLHDL 5 11/11/2013    Significant Diagnostic Results in last 30 days:  No results found.  Assessment/Plan 1. Acute cystitis with hematuria Improved after foley placed but continues with lethargy and noted small amt of blood in the foley bag. I spoke with his wife [name]. She does not want to proceed with treatment because she reports he would not want to continue to survive in the poor quality of life that he is in. I let her know that he is not actively dying but could if he developed sepsis. It is also possible that with the foley in and adequate hydration he could improve without treatment. She is aware and would like to avoid antibiotics, IVF, and hospitalizations. We discussed adding morphine to his regimen (in the event he developed pain) but she is not ready for him to be sedated and he is not in pain at this time. Due to his dementia it is difficult to assess his pain, she would like his tylenol increased for this reason. Increased the tylenol to 1 gram tid. Most form was filled out and left for her to sign when she visits later today.  He will remain on isolation until his covid swab returns negative. D/C aspirin due to hematuria and lack of benefit.   2. 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   3. Benign non-nodular prostatic hyperplasia with lower urinary tract symptoms Noted retention which has resolved with a foley. Recommend leaving the foley in for now for comfort. If it begins to bother him we could consider using flomax and try voiding trials.  Family/ staff Communication: [name] (wife) Labs/tests ordered:  NA

## 2019-07-13 IMAGING — CR DG CHEST 2V
4 series · 4 of 4 positions shown · non-contrast
Comparison: March 08, 2006

CLINICAL DATA: Cough and fever

EXAM:
CHEST  2 VIEW

[w chest lat (1 of 2)]
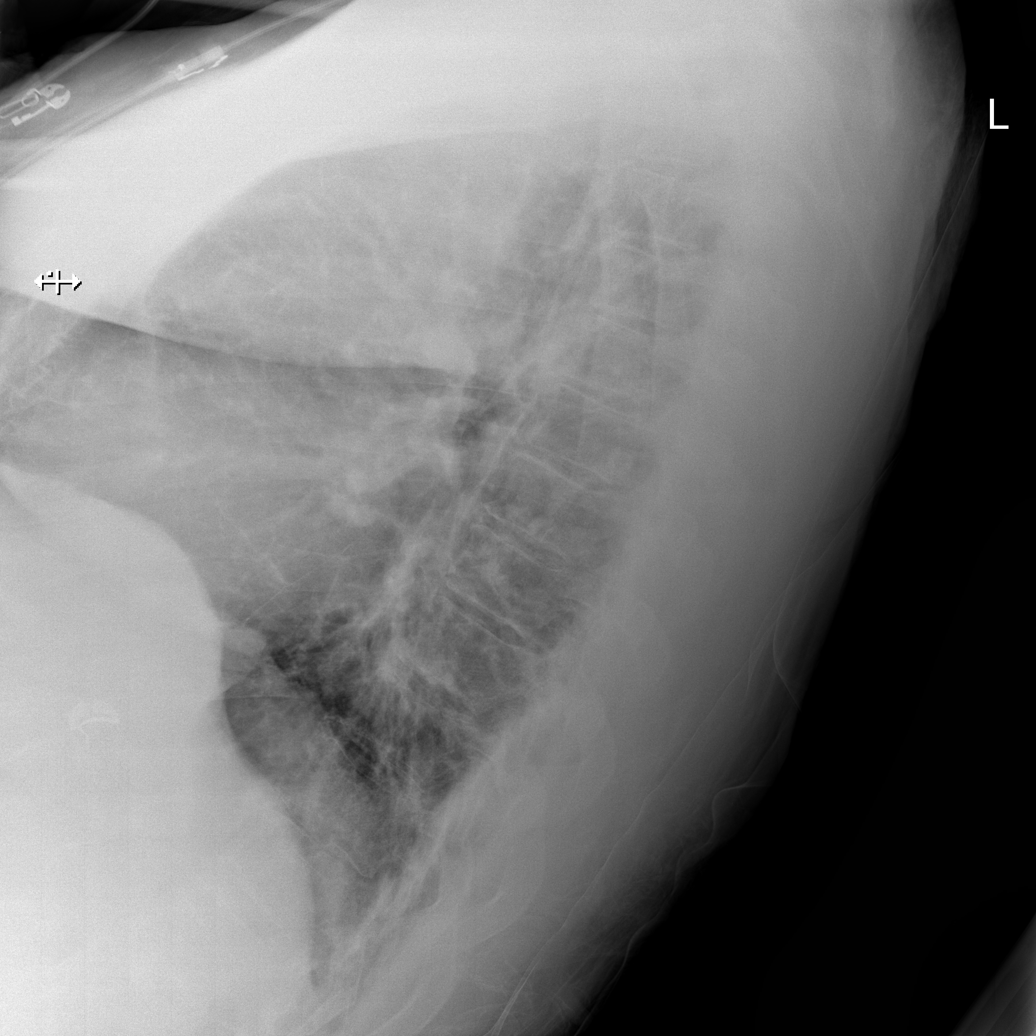

[w chest lat (2 of 2)]
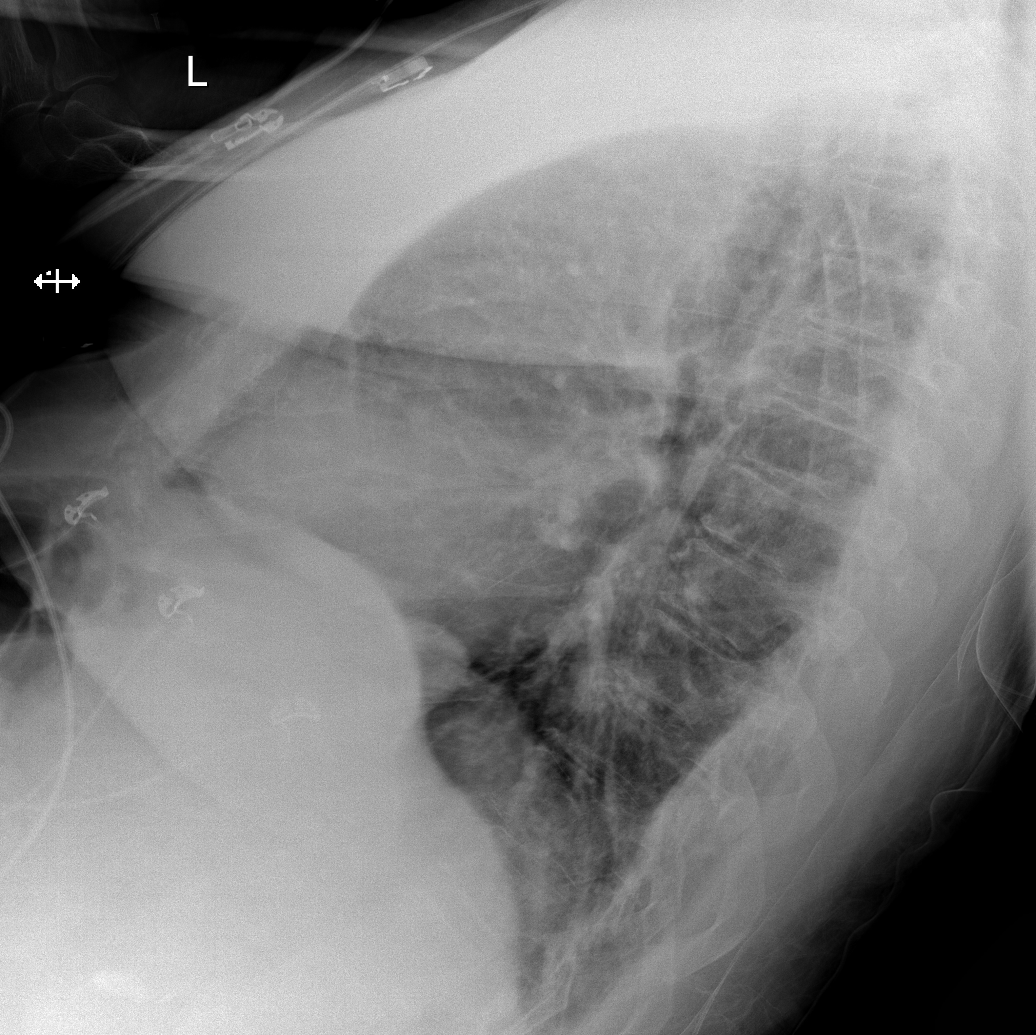

[x chest ap (1 of 2)]
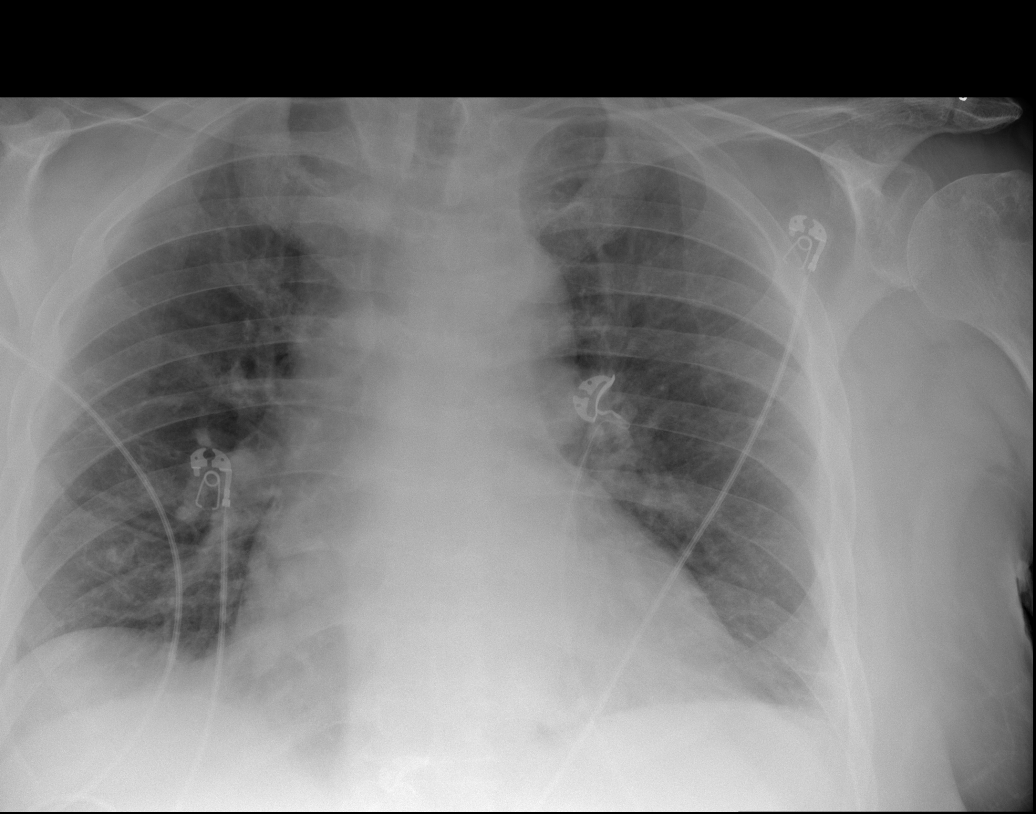

[x chest ap (2 of 2)]
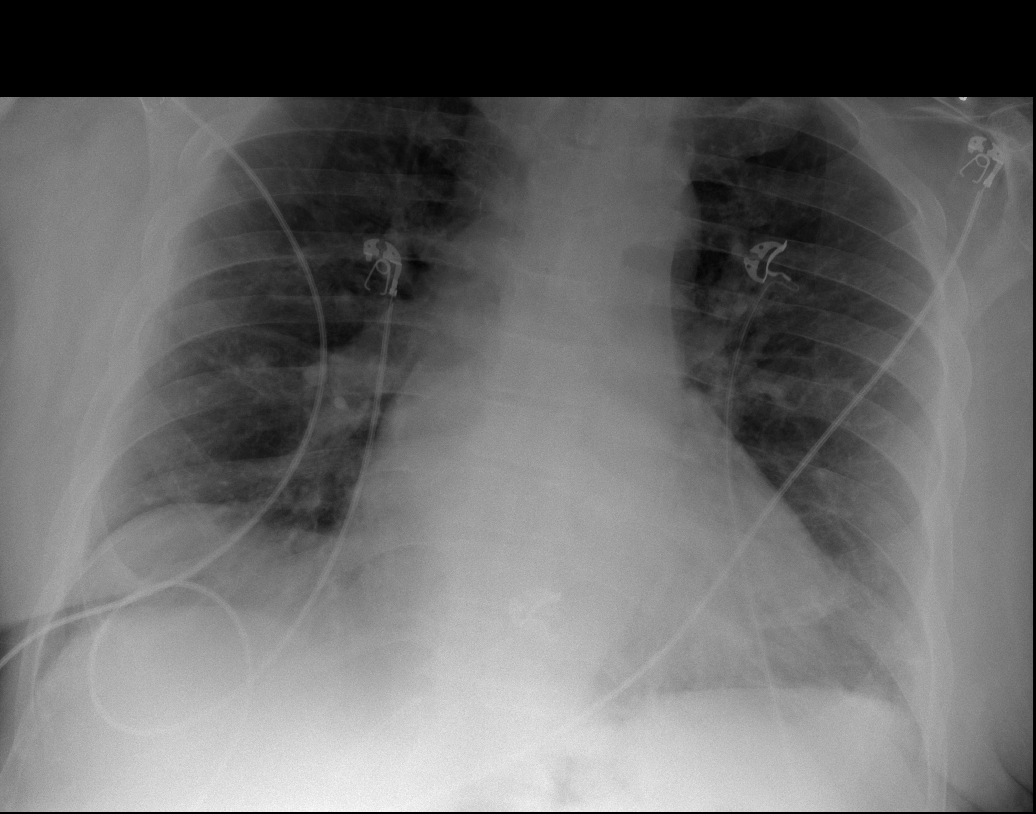

[4 of 4 positions shown; findings below may reference images not displayed]

FINDINGS: There is no edema or consolidation. Heart is upper normal in size
with pulmonary vascularity within normal limits. No adenopathy. No
evident bone lesions.
IMPRESSION: No edema or consolidation.

## 2019-07-15 DIAGNOSIS — R278 Other lack of coordination: Secondary | ICD-10-CM | POA: Diagnosis not present

## 2019-07-15 DIAGNOSIS — R293 Abnormal posture: Secondary | ICD-10-CM | POA: Diagnosis not present

## 2019-07-15 DIAGNOSIS — M6389 Disorders of muscle in diseases classified elsewhere, multiple sites: Secondary | ICD-10-CM | POA: Diagnosis not present

## 2019-07-15 DIAGNOSIS — R4189 Other symptoms and signs involving cognitive functions and awareness: Secondary | ICD-10-CM | POA: Diagnosis not present

## 2019-07-17 ENCOUNTER — Other Ambulatory Visit: Payer: Self-pay | Admitting: *Deleted

## 2019-07-17 DIAGNOSIS — M6389 Disorders of muscle in diseases classified elsewhere, multiple sites: Secondary | ICD-10-CM | POA: Diagnosis not present

## 2019-07-17 DIAGNOSIS — R278 Other lack of coordination: Secondary | ICD-10-CM | POA: Diagnosis not present

## 2019-07-17 DIAGNOSIS — R293 Abnormal posture: Secondary | ICD-10-CM | POA: Diagnosis not present

## 2019-07-17 DIAGNOSIS — R4189 Other symptoms and signs involving cognitive functions and awareness: Secondary | ICD-10-CM | POA: Diagnosis not present

## 2019-07-17 LAB — NOVEL CORONAVIRUS 2019: Coronavirus Source: NOT DETECTED

## 2019-07-22 DIAGNOSIS — Z23 Encounter for immunization: Secondary | ICD-10-CM | POA: Diagnosis not present

## 2019-07-22 DIAGNOSIS — R278 Other lack of coordination: Secondary | ICD-10-CM | POA: Diagnosis not present

## 2019-07-22 DIAGNOSIS — R293 Abnormal posture: Secondary | ICD-10-CM | POA: Diagnosis not present

## 2019-07-22 DIAGNOSIS — R4189 Other symptoms and signs involving cognitive functions and awareness: Secondary | ICD-10-CM | POA: Diagnosis not present

## 2019-07-22 DIAGNOSIS — M6389 Disorders of muscle in diseases classified elsewhere, multiple sites: Secondary | ICD-10-CM | POA: Diagnosis not present

## 2019-07-23 ENCOUNTER — Encounter: Payer: Self-pay | Admitting: Adult Health

## 2019-07-23 ENCOUNTER — Non-Acute Institutional Stay (SKILLED_NURSING_FACILITY): Payer: Medicare Other | Admitting: Adult Health

## 2019-07-23 DIAGNOSIS — N183 Chronic kidney disease, stage 3 unspecified: Secondary | ICD-10-CM | POA: Diagnosis not present

## 2019-07-23 DIAGNOSIS — N400 Enlarged prostate without lower urinary tract symptoms: Secondary | ICD-10-CM | POA: Diagnosis not present

## 2019-07-23 DIAGNOSIS — R634 Abnormal weight loss: Secondary | ICD-10-CM | POA: Diagnosis not present

## 2019-07-23 DIAGNOSIS — N1832 Chronic kidney disease, stage 3b: Secondary | ICD-10-CM

## 2019-07-23 DIAGNOSIS — M6389 Disorders of muscle in diseases classified elsewhere, multiple sites: Secondary | ICD-10-CM | POA: Diagnosis not present

## 2019-07-23 DIAGNOSIS — R278 Other lack of coordination: Secondary | ICD-10-CM | POA: Diagnosis not present

## 2019-07-23 DIAGNOSIS — I129 Hypertensive chronic kidney disease with stage 1 through stage 4 chronic kidney disease, or unspecified chronic kidney disease: Secondary | ICD-10-CM | POA: Diagnosis not present

## 2019-07-23 DIAGNOSIS — F0280 Dementia in other diseases classified elsewhere without behavioral disturbance: Secondary | ICD-10-CM | POA: Diagnosis not present

## 2019-07-23 DIAGNOSIS — F0150 Vascular dementia without behavioral disturbance: Secondary | ICD-10-CM | POA: Diagnosis not present

## 2019-07-23 DIAGNOSIS — R293 Abnormal posture: Secondary | ICD-10-CM | POA: Diagnosis not present

## 2019-07-23 DIAGNOSIS — R1310 Dysphagia, unspecified: Secondary | ICD-10-CM | POA: Diagnosis not present

## 2019-07-23 DIAGNOSIS — R4189 Other symptoms and signs involving cognitive functions and awareness: Secondary | ICD-10-CM | POA: Diagnosis not present

## 2019-07-23 DIAGNOSIS — G309 Alzheimer's disease, unspecified: Secondary | ICD-10-CM | POA: Diagnosis not present

## 2019-07-23 DIAGNOSIS — Z9189 Other specified personal risk factors, not elsewhere classified: Secondary | ICD-10-CM | POA: Diagnosis not present

## 2019-07-23 NOTE — Progress Notes (Signed)
Location:  Occupational psychologist of Service:  SNF (31) Provider:   Cindi Carbon, ANP [place] [phone]  Gayland Curry, DO  Patient Care Team: Gayland Curry, DO as PCP - General (Geriatric Medicine)  Extended Emergency Contact Information Primary Emergency Contact: Nehme,[name] Address: [address]          [place], [place] [address] [name] of [place] Phone: [phone] Mobile Phone: [phone] Relation: Spouse  Code Status:  DNR Goals of care: Advanced Directive information Advanced Directives 12/17/2018  Does Patient Have a Medical Advance Directive? Yes  Type of Paramedic of [place];Living will;Out of facility DNR (pink MOST or yellow form)  Does patient want to make changes to medical advance directive? No - Patient declined  Copy of [place] in Chart? Yes - validated most recent copy scanned in chart (See row information)  Would patient like information on creating a medical advance directive? -  Pre-existing out of facility DNR order (yellow form or pink MOST form) Yellow form placed in chart (order not valid for inpatient use)     Chief Complaint  Patient presents with  . Medical Management of Chronic Issues    HPI:  Pt is a 78 y.o. male seen today for medical management of chronic diseases.   On 06/29/19 he developed a fever with hematuria and urinary retention. Foley inserted. UA obtained which showed no growth. His wife was consulted and she only wanted comfort measures. Most form filled out indicating no hospitalizations. IVF and antibiotics to be determined. He was started on Flomax and the catheter was discontinued. His symptoms resolved and he is back to baseline. Voiding well with wet briefs.  Lisinopril was stopped due to acute on chronic renal failure with BUN/Cr increasing from 24.3/1.4 to 46.3/2.19.  BP has been controlled. No further labs were drawn due to  his goals of care.  He has severe dementia and is minimally verbal. Has had worsening aphasia, periods of agitation, and weight loss. Weight down 17 lbs in the past two months. Milk shake added to his regimen. He has had some dysphagia and is on a D3 diet with chopped meat. Intakes are improved in review of matrix records.   Wt Readings from Last 3 Encounters:  07/23/19 232 lb 12.8 oz (105.6 kg)  06/17/19 241 lb (109.3 kg)  05/20/19 249 lb 9.6 oz (113.2 kg)      Past Medical History:  Diagnosis Date  . Abnormality of gait 04/05/2015  . Benign prostatic hypertrophy   . Dementia ([place])   . Diabetes mellitus type II    no meds  . Elevated PSA   . Erectile dysfunction   . Gout   . Hereditary and idiopathic peripheral neuropathy 08/02/2016  . Hypertension   . Memory changes   . Memory difficulties 04/05/2015  . Nephrolithiasis    hx of  . Obstructive sleep apnea   . Partial Achilles tendon tear   . Rosacea, acne   . Tremor 04/05/2015   Past Surgical History:  Procedure Laterality Date  . CARPAL TUNNEL RELEASE  2004  . CARPAL TUNNEL RELEASE  10/31/2011   Procedure: CARPAL TUNNEL RELEASE;  Surgeon: Cammie Sickle., MD;  Location: [place];  Service: Orthopedics;  Laterality: Left;  . EYE SURGERY     cataracts  . HAND SURGERY  2007   right hand  . INGUINAL HERNIA REPAIR    .  JOINT REPLACEMENT     left total knee  . KNEE SURGERY  1961   left knee  . TONSILLECTOMY    . TONSILLECTOMY AND ADENOIDECTOMY    . TRANSURETHRAL RESECTION OF PROSTATE      Allergies  Allergen Reactions  . Ciprofloxacin     REACTION: Diarrhea,l rectal irritation    Outpatient Encounter Medications as of 07/23/2019  Medication Sig  . acetaminophen (TYLENOL) 500 MG tablet Take 1,000 mg by mouth 3 (three) times daily.   Marland Kitchen allopurinol (ZYLOPRIM) 100 MG tablet Take 100 mg by mouth daily.   Marland Kitchen amLODipine (NORVASC) 10 MG tablet Take 10 mg by mouth daily.  Marland Kitchen aspirin 81 MG tablet Take 81  mg by mouth daily.  . carboxymethylcellulose (REFRESH PLUS) 0.5 % SOLN 2 drops 2 (two) times daily as needed.  . Cholecalciferol (VITAMIN D-3 PO) Take 2,000 Units by mouth daily.   . Eyelid Cleansers (OCUSOFT EYELID CLEANSING) PADS Apply topically every morning.  . finasteride (PROSCAR) 5 MG tablet Take 5 mg by mouth at bedtime.   . hydrocortisone 2.5 % cream Apply 1 application topically as needed.  . pantoprazole (PROTONIX) 40 MG tablet Take 40 mg by mouth daily.   . sertraline (ZOLOFT) 100 MG tablet Take 100 mg by mouth at bedtime.  . tamsulosin (FLOMAX) 0.4 MG CAPS capsule Take 0.4 mg by mouth at bedtime.  . [DISCONTINUED] lisinopril (PRINIVIL,ZESTRIL) 40 MG tablet Take 40 mg by mouth daily.    No facility-administered encounter medications on file as of 07/23/2019.     Review of Systems  Unable to perform ROS: Dementia    Immunization History  Administered Date(s) Administered  . Influenza Split 08/18/2011, 08/08/2012  . Influenza Whole 08/21/2006, 07/11/2007, 09/13/2009, 07/13/2010  . Influenza, Seasonal, Injecte, Preservative Fre 07/10/2013  . Influenza,inj,Quad PF,6+ Mos 07/30/2018  . Influenza-Unspecified 08/08/2012, 07/17/2014, 08/10/2014, 06/16/2015, 07/30/2017  . Pneumococcal Conjugate-13 01/06/2014  . Pneumococcal Polysaccharide-23 03/12/2012, 03/10/2014  . Td 10/09/1992  . Tdap 03/12/2012, 01/08/2014  . Zoster 12/26/2007, 03/10/2014  . Zoster Recombinat (Shingrix) 01/23/2018   Pertinent  Health Maintenance Due  Topic Date Due  . INFLUENZA VACCINE  05/10/2019  . HEMOGLOBIN A1C  11/16/2019  . FOOT EXAM  04/16/2020  . PNA vac Low Risk Adult  Completed  . OPHTHALMOLOGY EXAM  Discontinued  . COLONOSCOPY  Discontinued   Fall Risk  01/10/2019 04/30/2018  Falls in the past year? 0 No  Number falls in past yr: 0 -  Injury with Fall? 0 -  Risk for fall due to : Impaired balance/gait;Mental status change -  Follow up Falls evaluation completed -   Functional Status  Survey:    Vitals:   07/23/19 1125  BP: 133/62  Pulse: 66  Resp: (!) 22  Temp: 98.1 F (36.7 C)  Weight: 232 lb 12.8 oz (105.6 kg)   Body mass index is 29.1 kg/m. Physical Exam Vitals signs and nursing note reviewed.  Constitutional:      General: He is not in acute distress.    Appearance: He is not diaphoretic.  HENT:     Head: Normocephalic and atraumatic.  Neck:     Thyroid: No thyromegaly.     Vascular: No JVD.     Trachea: No tracheal deviation.  Cardiovascular:     Rate and Rhythm: Normal rate and regular rhythm.     Heart sounds: No murmur.  Pulmonary:     Effort: Pulmonary effort is normal. No respiratory distress.     Breath  sounds: Normal breath sounds. No wheezing.  Abdominal:     General: Bowel sounds are normal. There is no distension.     Palpations: Abdomen is soft.     Tenderness: There is no abdominal tenderness.  Lymphadenopathy:     Cervical: No cervical adenopathy.  Skin:    General: Skin is warm and dry.  Neurological:     General: No focal deficit present.     Mental Status: He is alert. Mental status is at baseline.     Comments: Non verbal, able to f/c  Psychiatric:        Mood and Affect: Mood normal.     Labs reviewed: Recent Labs    11/22/18 06/30/19  NA 140 144  K 4.1 4.4  BUN 24* 46*  CREATININE 1.5* 2.2*   No results for input(s): AST, ALT, ALKPHOS, BILITOT, PROT, ALBUMIN in the last 8760 hours. Recent Labs    11/22/18 06/30/19  WBC 7.0 16.5  HGB 11.5* 10.6*  HCT 35* 32*  PLT 191 200   Lab Results  Component Value Date   TSH 1.74 11/22/2018   Lab Results  Component Value Date   HGBA1C 6.0 05/16/2019   Lab Results  Component Value Date   CHOL 151 11/11/2013   HDL 33.00 (L) 11/11/2013   LDLCALC 96 11/11/2013   LDLDIRECT 97.8 05/26/2009   TRIG 112.0 11/11/2013   CHOLHDL 5 11/11/2013    Significant Diagnostic Results in last 30 days:  No results found.  Assessment/Plan 1. Benign prostatic hyperplasia  without lower urinary tract symptoms Improved with the initiation of flomax 0.4 mg qhs, continue and monitor for regular wet briefs and abd distention. BP tolerating well.   2. Weight loss Due to acute illness and progressive dementia  3. Dysphagia, unspecified type Eating better, continue D3 diet with chopped meats  4. 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 Stage 3b chronic kidney disease Worsened due to post renal causes. Did not recheck due to his goals of care. Likely better as he is hydrated with normal vitals and back to baseline  6. Benign hypertension with chronic kidney disease, stage III Controlled, off lisinopril  Continue norvasc 10 mg qd    Needs most form scanned into [employer]  Family/ staff Communication: staff  Labs/tests ordered:  NA

## 2019-07-24 DIAGNOSIS — R278 Other lack of coordination: Secondary | ICD-10-CM | POA: Diagnosis not present

## 2019-07-24 DIAGNOSIS — M6389 Disorders of muscle in diseases classified elsewhere, multiple sites: Secondary | ICD-10-CM | POA: Diagnosis not present

## 2019-07-24 DIAGNOSIS — R293 Abnormal posture: Secondary | ICD-10-CM | POA: Diagnosis not present

## 2019-07-24 DIAGNOSIS — R4189 Other symptoms and signs involving cognitive functions and awareness: Secondary | ICD-10-CM | POA: Diagnosis not present

## 2019-07-28 DIAGNOSIS — R278 Other lack of coordination: Secondary | ICD-10-CM | POA: Diagnosis not present

## 2019-07-28 DIAGNOSIS — M6389 Disorders of muscle in diseases classified elsewhere, multiple sites: Secondary | ICD-10-CM | POA: Diagnosis not present

## 2019-07-28 DIAGNOSIS — R293 Abnormal posture: Secondary | ICD-10-CM | POA: Diagnosis not present

## 2019-07-28 DIAGNOSIS — Z9189 Other specified personal risk factors, not elsewhere classified: Secondary | ICD-10-CM | POA: Diagnosis not present

## 2019-07-28 DIAGNOSIS — R4189 Other symptoms and signs involving cognitive functions and awareness: Secondary | ICD-10-CM | POA: Diagnosis not present

## 2019-07-30 DIAGNOSIS — R278 Other lack of coordination: Secondary | ICD-10-CM | POA: Diagnosis not present

## 2019-07-30 DIAGNOSIS — R293 Abnormal posture: Secondary | ICD-10-CM | POA: Diagnosis not present

## 2019-07-30 DIAGNOSIS — M6389 Disorders of muscle in diseases classified elsewhere, multiple sites: Secondary | ICD-10-CM | POA: Diagnosis not present

## 2019-07-30 DIAGNOSIS — R4189 Other symptoms and signs involving cognitive functions and awareness: Secondary | ICD-10-CM | POA: Diagnosis not present

## 2019-08-01 DIAGNOSIS — R293 Abnormal posture: Secondary | ICD-10-CM | POA: Diagnosis not present

## 2019-08-01 DIAGNOSIS — R278 Other lack of coordination: Secondary | ICD-10-CM | POA: Diagnosis not present

## 2019-08-01 DIAGNOSIS — M6389 Disorders of muscle in diseases classified elsewhere, multiple sites: Secondary | ICD-10-CM | POA: Diagnosis not present

## 2019-08-01 DIAGNOSIS — R4189 Other symptoms and signs involving cognitive functions and awareness: Secondary | ICD-10-CM | POA: Diagnosis not present

## 2019-08-04 DIAGNOSIS — R293 Abnormal posture: Secondary | ICD-10-CM | POA: Diagnosis not present

## 2019-08-04 DIAGNOSIS — M6389 Disorders of muscle in diseases classified elsewhere, multiple sites: Secondary | ICD-10-CM | POA: Diagnosis not present

## 2019-08-04 DIAGNOSIS — R4189 Other symptoms and signs involving cognitive functions and awareness: Secondary | ICD-10-CM | POA: Diagnosis not present

## 2019-08-04 DIAGNOSIS — R278 Other lack of coordination: Secondary | ICD-10-CM | POA: Diagnosis not present

## 2019-08-11 DIAGNOSIS — Z9189 Other specified personal risk factors, not elsewhere classified: Secondary | ICD-10-CM | POA: Diagnosis not present

## 2019-08-15 ENCOUNTER — Encounter: Payer: Self-pay | Admitting: Adult Health

## 2019-08-15 ENCOUNTER — Non-Acute Institutional Stay (SKILLED_NURSING_FACILITY): Payer: Medicare Other | Admitting: Adult Health

## 2019-08-15 DIAGNOSIS — M6389 Disorders of muscle in diseases classified elsewhere, multiple sites: Secondary | ICD-10-CM | POA: Diagnosis not present

## 2019-08-15 DIAGNOSIS — M1A30X0 Chronic gout due to renal impairment, unspecified site, without tophus (tophi): Secondary | ICD-10-CM

## 2019-08-15 DIAGNOSIS — E119 Type 2 diabetes mellitus without complications: Secondary | ICD-10-CM

## 2019-08-15 DIAGNOSIS — N401 Enlarged prostate with lower urinary tract symptoms: Secondary | ICD-10-CM

## 2019-08-15 DIAGNOSIS — R293 Abnormal posture: Secondary | ICD-10-CM | POA: Diagnosis not present

## 2019-08-15 DIAGNOSIS — F0280 Dementia in other diseases classified elsewhere without behavioral disturbance: Secondary | ICD-10-CM | POA: Diagnosis not present

## 2019-08-15 DIAGNOSIS — R278 Other lack of coordination: Secondary | ICD-10-CM | POA: Diagnosis not present

## 2019-08-15 DIAGNOSIS — R634 Abnormal weight loss: Secondary | ICD-10-CM | POA: Diagnosis not present

## 2019-08-15 DIAGNOSIS — F0150 Vascular dementia without behavioral disturbance: Secondary | ICD-10-CM | POA: Diagnosis not present

## 2019-08-15 DIAGNOSIS — G309 Alzheimer's disease, unspecified: Secondary | ICD-10-CM | POA: Diagnosis not present

## 2019-08-15 DIAGNOSIS — G4733 Obstructive sleep apnea (adult) (pediatric): Secondary | ICD-10-CM

## 2019-08-15 DIAGNOSIS — R4189 Other symptoms and signs involving cognitive functions and awareness: Secondary | ICD-10-CM | POA: Diagnosis not present

## 2019-08-18 DIAGNOSIS — R293 Abnormal posture: Secondary | ICD-10-CM | POA: Diagnosis not present

## 2019-08-18 DIAGNOSIS — R278 Other lack of coordination: Secondary | ICD-10-CM | POA: Diagnosis not present

## 2019-08-18 DIAGNOSIS — F0150 Vascular dementia without behavioral disturbance: Secondary | ICD-10-CM | POA: Diagnosis not present

## 2019-08-18 DIAGNOSIS — F0280 Dementia in other diseases classified elsewhere without behavioral disturbance: Secondary | ICD-10-CM | POA: Diagnosis not present

## 2019-08-18 DIAGNOSIS — R4189 Other symptoms and signs involving cognitive functions and awareness: Secondary | ICD-10-CM | POA: Diagnosis not present

## 2019-08-18 DIAGNOSIS — M6389 Disorders of muscle in diseases classified elsewhere, multiple sites: Secondary | ICD-10-CM | POA: Diagnosis not present

## 2019-08-18 DIAGNOSIS — E119 Type 2 diabetes mellitus without complications: Secondary | ICD-10-CM | POA: Diagnosis not present

## 2019-08-19 NOTE — Progress Notes (Addendum)
Location:  Occupational psychologist of Service:  SNF (31) Provider:   Cindi Carbon, ANP [place] [phone]  Gayland Curry, DO  Patient Care Team: Gayland Curry, DO as PCP - General (Geriatric Medicine)  Extended Emergency Contact Information Primary Emergency Contact: Bolz,[name] Address: [address]          [place], [place] [address] [name] of [place] Phone: [phone] Mobile Phone: [phone] Relation: Spouse  Code Status:  DNR Goals of care: Advanced Directive information Advanced Directives 12/17/2018  Does Patient Have a Medical Advance Directive? Yes  Type of Paramedic of [place];Living will;Out of facility DNR (pink MOST or yellow form)  Does patient want to make changes to medical advance directive? No - Patient declined  Copy of [place] in Chart? Yes - validated most recent copy scanned in chart (See row information)  Would patient like information on creating a medical advance directive? -  Pre-existing out of facility DNR order (yellow form or pink MOST form) Yellow form placed in chart (order not valid for inpatient use)     Chief Complaint  Patient presents with  . Medical Management of Chronic Issues    HPI:  Pt is a 78 y.o. male seen today for medical management of chronic diseases.    BPH: urinating regularly with we briefs noted on flomax. No urinary symptoms or fever  Mixed dementia: no issues with behaviors, requires assistance with all ADLs, non verbal, no longer on memory meds  Weight loss: due to decreased intake and acute illness.  Wt Readings from Last 3 Encounters:  08/19/19 227 lb 1.6 oz (103 kg)  07/23/19 232 lb 12.8 oz (105.6 kg)  06/17/19 241 lb (109.3 kg)   Gout: no new issues with joint pain or swelling.   Functional status: hoyer lift, incontinent of stool/urine.  Past Medical History:  Diagnosis Date  . Abnormality of gait  04/05/2015  . Benign prostatic hypertrophy   . Dementia ([place])   . Diabetes mellitus type II    no meds  . Elevated PSA   . Erectile dysfunction   . Gout   . Hereditary and idiopathic peripheral neuropathy 08/02/2016  . Hypertension   . Memory changes   . Memory difficulties 04/05/2015  . Nephrolithiasis    hx of  . Obstructive sleep apnea   . Partial Achilles tendon tear   . Rosacea, acne   . Tremor 04/05/2015   Past Surgical History:  Procedure Laterality Date  . CARPAL TUNNEL RELEASE  2004  . CARPAL TUNNEL RELEASE  10/31/2011   Procedure: CARPAL TUNNEL RELEASE;  Surgeon: Cammie Sickle., MD;  Location: [facility];  Service: Orthopedics;  Laterality: Left;  . EYE SURGERY     cataracts  . HAND SURGERY  2007   right hand  . INGUINAL HERNIA REPAIR    . JOINT REPLACEMENT     left total knee  . KNEE SURGERY  1961   left knee  . TONSILLECTOMY    . TONSILLECTOMY AND ADENOIDECTOMY    . TRANSURETHRAL RESECTION OF PROSTATE      Allergies  Allergen Reactions  . Ciprofloxacin     REACTION: Diarrhea,l rectal irritation    Outpatient Encounter Medications as of 08/15/2019  Medication Sig  . allopurinol (ZYLOPRIM) 100 MG tablet Take 100 mg by mouth daily.   Marland Kitchen amLODipine (NORVASC) 10 MG tablet Take 10 mg by mouth daily.  Marland Kitchen  carboxymethylcellulose (REFRESH PLUS) 0.5 % SOLN 2 drops 2 (two) times daily as needed.  . Cholecalciferol (VITAMIN D-3 PO) Take 2,000 Units by mouth daily.   . Eyelid Cleansers (OCUSOFT EYELID CLEANSING) PADS Apply topically every morning.  . finasteride (PROSCAR) 5 MG tablet Take 5 mg by mouth at bedtime.   . hydrocortisone 2.5 % cream Apply 1 application topically as needed.  . pantoprazole (PROTONIX) 40 MG tablet Take 40 mg by mouth daily.   . sertraline (ZOLOFT) 100 MG tablet Take 100 mg by mouth at bedtime.  . tamsulosin (FLOMAX) 0.4 MG CAPS capsule Take 0.4 mg by mouth at bedtime.  Marland Kitchen acetaminophen (TYLENOL) 500 MG tablet Take 1,000 mg  by mouth 3 (three) times daily.   . [DISCONTINUED] aspirin 81 MG tablet Take 81 mg by mouth daily.   No facility-administered encounter medications on file as of 08/15/2019.     Review of Systems  Unable to perform ROS: Dementia    Immunization History  Administered Date(s) Administered  . Influenza Split 08/18/2011, 08/08/2012  . Influenza Whole 08/21/2006, 07/11/2007, 09/13/2009, 07/13/2010  . Influenza, High Dose Seasonal PF 07/24/2019  . Influenza, Seasonal, Injecte, Preservative Fre 07/10/2013  . Influenza,inj,Quad PF,6+ Mos 07/30/2018  . Influenza-Unspecified 08/08/2012, 07/17/2014, 08/10/2014, 06/16/2015, 07/30/2017  . Pneumococcal Conjugate-13 01/06/2014  . Pneumococcal Polysaccharide-23 03/12/2012, 03/10/2014  . Td 10/09/1992  . Tdap 03/12/2012, 01/08/2014  . Zoster 12/26/2007, 03/10/2014  . Zoster Recombinat (Shingrix) 01/23/2018   Pertinent  Health Maintenance Due  Topic Date Due  . URINE MICROALBUMIN  04/24/2018  . HEMOGLOBIN A1C  11/16/2019  . FOOT EXAM  06/29/2020  . INFLUENZA VACCINE  Completed  . PNA vac Low Risk Adult  Completed  . OPHTHALMOLOGY EXAM  Discontinued  . COLONOSCOPY  Discontinued   Fall Risk  01/10/2019 04/30/2018  Falls in the past year? 0 No  Number falls in past yr: 0 -  Injury with Fall? 0 -  Risk for fall due to : Impaired balance/gait;Mental status change -  Follow up Falls evaluation completed -   Functional Status Survey:    Vitals:   08/19/19 0841  Weight: 227 lb 1.6 oz (103 kg)   Body mass index is 28.39 kg/m.  Wt Readings from Last 3 Encounters:  08/19/19 227 lb 1.6 oz (103 kg)  07/23/19 232 lb 12.8 oz (105.6 kg)  06/17/19 241 lb (109.3 kg)   Physical Exam Vitals signs and nursing note reviewed.  Constitutional:      General: He is not in acute distress.    Appearance: He is not diaphoretic.  HENT:     Head: Normocephalic and atraumatic.  Neck:     Thyroid: No thyromegaly.     Vascular: No JVD.     Trachea: No  tracheal deviation.  Cardiovascular:     Rate and Rhythm: Normal rate and regular rhythm.     Heart sounds: No murmur.  Pulmonary:     Effort: Pulmonary effort is normal. No respiratory distress.     Breath sounds: Normal breath sounds. No wheezing.  Abdominal:     General: Bowel sounds are normal. There is no distension.     Palpations: Abdomen is soft.     Tenderness: There is no abdominal tenderness.  Musculoskeletal:     Right lower leg: No edema.     Left lower leg: No edema.  Lymphadenopathy:     Cervical: No cervical adenopathy.  Skin:    General: Skin is warm and dry.  Neurological:  General: No focal deficit present.     Mental Status: He is alert. Mental status is at baseline.  Psychiatric:        Mood and Affect: Mood normal.     Labs reviewed: Recent Labs    11/22/18 06/30/19  NA 140 144  K 4.1 4.4  BUN 24* 46*  CREATININE 1.5* 2.2*   No results for input(s): AST, ALT, ALKPHOS, BILITOT, PROT, ALBUMIN in the last 8760 hours. Recent Labs    11/22/18 06/30/19  WBC 7.0 16.5  HGB 11.5* 10.6*  HCT 35* 32*  PLT 191 200   Lab Results  Component Value Date   TSH 1.74 11/22/2018   Lab Results  Component Value Date   HGBA1C 6.0 05/16/2019   Lab Results  Component Value Date   CHOL 151 11/11/2013   HDL 33.00 (L) 11/11/2013   LDLCALC 96 11/11/2013   LDLDIRECT 97.8 05/26/2009   TRIG 112.0 11/11/2013   CHOLHDL 5 11/11/2013    Significant Diagnostic Results in last 30 days:  No results found.  Assessment/Plan 1. Benign non-nodular prostatic hyperplasia with lower urinary tract symptoms Improved, continue flomax 0.4 mg qhs  2. Mixed Alzheimer's and vascular dementia ([place]) Progressive decline in cognition and physical function c/w the disease. Continue supportive care in the skilled environment.  3. Severe obstructive sleep apnea No issues with sleep reported, intermittently wears mask cpap but some times tears it off   4. Diabetes mellitus type  2 without retinopathy ([place]) Controlled, not currently on medications  D/C CBGS and monitor A1C q 6 months   5. Chronic gout due to renal impairment without tophus, unspecified site No new issues, continues on allopurinol 100 mg qd  6. Weight loss Due to acute illness and decreased appetite.  Off milkshake qd prn to support oral intake  Family/ staff Communication: staff  Labs/tests ordered:  NA

## 2019-08-20 DIAGNOSIS — Z9189 Other specified personal risk factors, not elsewhere classified: Secondary | ICD-10-CM | POA: Diagnosis not present

## 2019-08-25 DIAGNOSIS — R278 Other lack of coordination: Secondary | ICD-10-CM | POA: Diagnosis not present

## 2019-08-25 DIAGNOSIS — F0150 Vascular dementia without behavioral disturbance: Secondary | ICD-10-CM | POA: Diagnosis not present

## 2019-08-25 DIAGNOSIS — R293 Abnormal posture: Secondary | ICD-10-CM | POA: Diagnosis not present

## 2019-08-25 DIAGNOSIS — F0280 Dementia in other diseases classified elsewhere without behavioral disturbance: Secondary | ICD-10-CM | POA: Diagnosis not present

## 2019-08-25 DIAGNOSIS — M6389 Disorders of muscle in diseases classified elsewhere, multiple sites: Secondary | ICD-10-CM | POA: Diagnosis not present

## 2019-08-25 DIAGNOSIS — R4189 Other symptoms and signs involving cognitive functions and awareness: Secondary | ICD-10-CM | POA: Diagnosis not present

## 2019-08-26 DIAGNOSIS — Z9189 Other specified personal risk factors, not elsewhere classified: Secondary | ICD-10-CM | POA: Diagnosis not present

## 2019-08-27 DIAGNOSIS — R293 Abnormal posture: Secondary | ICD-10-CM | POA: Diagnosis not present

## 2019-08-27 DIAGNOSIS — F0280 Dementia in other diseases classified elsewhere without behavioral disturbance: Secondary | ICD-10-CM | POA: Diagnosis not present

## 2019-08-27 DIAGNOSIS — R4189 Other symptoms and signs involving cognitive functions and awareness: Secondary | ICD-10-CM | POA: Diagnosis not present

## 2019-08-27 DIAGNOSIS — F0150 Vascular dementia without behavioral disturbance: Secondary | ICD-10-CM | POA: Diagnosis not present

## 2019-08-27 DIAGNOSIS — R278 Other lack of coordination: Secondary | ICD-10-CM | POA: Diagnosis not present

## 2019-08-27 DIAGNOSIS — M6389 Disorders of muscle in diseases classified elsewhere, multiple sites: Secondary | ICD-10-CM | POA: Diagnosis not present

## 2019-09-01 DIAGNOSIS — F0280 Dementia in other diseases classified elsewhere without behavioral disturbance: Secondary | ICD-10-CM | POA: Diagnosis not present

## 2019-09-01 DIAGNOSIS — R278 Other lack of coordination: Secondary | ICD-10-CM | POA: Diagnosis not present

## 2019-09-01 DIAGNOSIS — M6389 Disorders of muscle in diseases classified elsewhere, multiple sites: Secondary | ICD-10-CM | POA: Diagnosis not present

## 2019-09-01 DIAGNOSIS — R4189 Other symptoms and signs involving cognitive functions and awareness: Secondary | ICD-10-CM | POA: Diagnosis not present

## 2019-09-01 DIAGNOSIS — F0150 Vascular dementia without behavioral disturbance: Secondary | ICD-10-CM | POA: Diagnosis not present

## 2019-09-01 DIAGNOSIS — R293 Abnormal posture: Secondary | ICD-10-CM | POA: Diagnosis not present

## 2019-09-02 DIAGNOSIS — F0280 Dementia in other diseases classified elsewhere without behavioral disturbance: Secondary | ICD-10-CM | POA: Diagnosis not present

## 2019-09-02 DIAGNOSIS — F0150 Vascular dementia without behavioral disturbance: Secondary | ICD-10-CM | POA: Diagnosis not present

## 2019-09-02 DIAGNOSIS — R278 Other lack of coordination: Secondary | ICD-10-CM | POA: Diagnosis not present

## 2019-09-02 DIAGNOSIS — R4189 Other symptoms and signs involving cognitive functions and awareness: Secondary | ICD-10-CM | POA: Diagnosis not present

## 2019-09-02 DIAGNOSIS — M6389 Disorders of muscle in diseases classified elsewhere, multiple sites: Secondary | ICD-10-CM | POA: Diagnosis not present

## 2019-09-02 DIAGNOSIS — R293 Abnormal posture: Secondary | ICD-10-CM | POA: Diagnosis not present

## 2019-09-12 ENCOUNTER — Non-Acute Institutional Stay (SKILLED_NURSING_FACILITY): Payer: Medicare Other | Admitting: Adult Health

## 2019-09-12 ENCOUNTER — Encounter: Payer: Self-pay | Admitting: Adult Health

## 2019-09-12 DIAGNOSIS — L02425 Furuncle of right lower limb: Secondary | ICD-10-CM

## 2019-09-12 NOTE — Progress Notes (Signed)
Location:  Occupational psychologist of Service:  SNF (31) Provider:   Cindi Carbon, ANP [place] [phone]   Gayland Curry, DO  Patient Care Team: Gayland Curry, DO as PCP - General (Geriatric Medicine)  Extended Emergency Contact Information Primary Emergency Contact: Raptis,[name] Address: [address]          [place], [place] [address] [name] of [place] Phone: [phone] Mobile Phone: [phone] Relation: Spouse  Code Status:  DNR Goals of care: Advanced Directive information Advanced Directives 12/17/2018  Does Patient Have a Medical Advance Directive? Yes  Type of Paramedic of [place];Living will;Out of facility DNR (pink MOST or yellow form)  Does patient want to make changes to medical advance directive? No - Patient declined  Copy of [place] in Chart? Yes - validated most recent copy scanned in chart (See row information)  Would patient like information on creating a medical advance directive? -  Pre-existing out of facility DNR order (yellow form or pink MOST form) Yellow form placed in chart (order not valid for inpatient use)     Chief Complaint  Patient presents with  . Acute Visit    right thigh wound    HPI:  Pt is a 78 y.o. male seen today for an acute visit for right thigh wound. The area is a furuncle that has been draining small amts for the past 3 days. It is mildly tender. No red streaking, edema, or fever.    Past Medical History:  Diagnosis Date  . Abnormality of gait 04/05/2015  . Benign prostatic hypertrophy   . Dementia ([place])   . Diabetes mellitus type II    no meds  . Elevated PSA   . Erectile dysfunction   . Gout   . Hereditary and idiopathic peripheral neuropathy 08/02/2016  . Hypertension   . Memory changes   . Memory difficulties 04/05/2015  . Nephrolithiasis    hx of  . Obstructive sleep apnea   . Partial Achilles tendon tear    . Rosacea, acne   . Tremor 04/05/2015   Past Surgical History:  Procedure Laterality Date  . CARPAL TUNNEL RELEASE  2004  . CARPAL TUNNEL RELEASE  10/31/2011   Procedure: CARPAL TUNNEL RELEASE;  Surgeon: Cammie Sickle., MD;  Location: [facility];  Service: Orthopedics;  Laterality: Left;  . EYE SURGERY     cataracts  . HAND SURGERY  2007   right hand  . INGUINAL HERNIA REPAIR    . JOINT REPLACEMENT     left total knee  . KNEE SURGERY  1961   left knee  . TONSILLECTOMY    . TONSILLECTOMY AND ADENOIDECTOMY    . TRANSURETHRAL RESECTION OF PROSTATE      Allergies  Allergen Reactions  . Ciprofloxacin     REACTION: Diarrhea,l rectal irritation    Outpatient Encounter Medications as of 09/12/2019  Medication Sig  . acetaminophen (TYLENOL) 500 MG tablet Take 1,000 mg by mouth 3 (three) times daily.   Marland Kitchen allopurinol (ZYLOPRIM) 100 MG tablet Take 100 mg by mouth daily.   Marland Kitchen amLODipine (NORVASC) 10 MG tablet Take 10 mg by mouth daily.  . carboxymethylcellulose (REFRESH PLUS) 0.5 % SOLN 2 drops 2 (two) times daily as needed.  . Cholecalciferol (VITAMIN D-3 PO) Take 2,000 Units by mouth daily.   . Eyelid Cleansers (OCUSOFT EYELID CLEANSING) PADS Apply topically every morning.  . finasteride (PROSCAR)  5 MG tablet Take 5 mg by mouth at bedtime.   . hydrocortisone 2.5 % cream Apply 1 application topically as needed.  . pantoprazole (PROTONIX) 40 MG tablet Take 40 mg by mouth daily.   . sertraline (ZOLOFT) 100 MG tablet Take 100 mg by mouth at bedtime.  . tamsulosin (FLOMAX) 0.4 MG CAPS capsule Take 0.4 mg by mouth at bedtime.   No facility-administered encounter medications on file as of 09/12/2019.     Review of Systems  Unable to perform ROS: Dementia    Immunization History  Administered Date(s) Administered  . Influenza Split 08/18/2011, 08/08/2012  . Influenza Whole 08/21/2006, 07/11/2007, 09/13/2009, 07/13/2010  . Influenza, High Dose Seasonal PF 07/24/2019   . Influenza, Seasonal, Injecte, Preservative Fre 07/10/2013  . Influenza,inj,Quad PF,6+ Mos 07/30/2018  . Influenza-Unspecified 08/08/2012, 07/17/2014, 08/10/2014, 06/16/2015, 07/30/2017  . Pneumococcal Conjugate-13 01/06/2014  . Pneumococcal Polysaccharide-23 03/12/2012, 03/10/2014  . Td 10/09/1992  . Tdap 03/12/2012, 01/08/2014  . Zoster 12/26/2007, 03/10/2014  . Zoster Recombinat (Shingrix) 01/23/2018   Pertinent  Health Maintenance Due  Topic Date Due  . URINE MICROALBUMIN  09/18/2019 (Originally 04/24/2018)  . HEMOGLOBIN A1C  11/16/2019  . FOOT EXAM  06/29/2020  . INFLUENZA VACCINE  Completed  . PNA vac Low Risk Adult  Completed  . OPHTHALMOLOGY EXAM  Discontinued  . COLONOSCOPY  Discontinued   Fall Risk  01/10/2019 04/30/2018  Falls in the past year? 0 No  Number falls in past yr: 0 -  Injury with Fall? 0 -  Risk for fall due to : Impaired balance/gait;Mental status change -  Follow up Falls evaluation completed -   Functional Status Survey:    Vitals:   09/12/19 1406  Temp: 97.8 F (36.6 C)   There is no height or weight on file to calculate BMI. Physical Exam Vitals signs and nursing note reviewed.  Skin:    General: Skin is warm and dry.     Comments: Right upper thigh with open furuncle. Mildly tender with small amt of drainage, purulent which was expressed. No streaking redness or warmth. Or swelling.   Neurological:     Mental Status: He is alert.     Labs reviewed: Recent Labs    11/22/18 06/30/19  NA 140 144  K 4.1 4.4  BUN 24* 46*  CREATININE 1.5* 2.2*   No results for input(s): AST, ALT, ALKPHOS, BILITOT, PROT, ALBUMIN in the last 8760 hours. Recent Labs    11/22/18 06/30/19  WBC 7.0 16.5  HGB 11.5* 10.6*  HCT 35* 32*  PLT 191 200   Lab Results  Component Value Date   TSH 1.74 11/22/2018   Lab Results  Component Value Date   HGBA1C 6.0 05/16/2019   Lab Results  Component Value Date   CHOL 151 11/11/2013   HDL 33.00 (L) 11/11/2013    LDLCALC 96 11/11/2013   LDLDIRECT 97.8 05/26/2009   TRIG 112.0 11/11/2013   CHOLHDL 5 11/11/2013    Significant Diagnostic Results in last 30 days:  No results found.  Assessment/Plan 1. Furuncle of right thigh Already open and draining. Apply warm compresses 15 min tid x 3 days. Bactroban bid x 7 days with dressing changes. If no improvement or worsening report to psc.     Family/ staff Communication: staff  Labs/tests ordered:  NA

## 2019-09-22 DIAGNOSIS — Z20828 Contact with and (suspected) exposure to other viral communicable diseases: Secondary | ICD-10-CM | POA: Diagnosis not present

## 2019-09-22 DIAGNOSIS — Z9189 Other specified personal risk factors, not elsewhere classified: Secondary | ICD-10-CM | POA: Diagnosis not present

## 2019-09-23 ENCOUNTER — Non-Acute Institutional Stay (SKILLED_NURSING_FACILITY): Payer: Medicare Other | Admitting: Internal Medicine

## 2019-09-23 ENCOUNTER — Encounter: Payer: Self-pay | Admitting: Internal Medicine

## 2019-09-23 DIAGNOSIS — G4733 Obstructive sleep apnea (adult) (pediatric): Secondary | ICD-10-CM

## 2019-09-23 DIAGNOSIS — R1310 Dysphagia, unspecified: Secondary | ICD-10-CM

## 2019-09-23 DIAGNOSIS — F0280 Dementia in other diseases classified elsewhere without behavioral disturbance: Secondary | ICD-10-CM

## 2019-09-23 DIAGNOSIS — G309 Alzheimer's disease, unspecified: Secondary | ICD-10-CM | POA: Diagnosis not present

## 2019-09-23 DIAGNOSIS — R634 Abnormal weight loss: Secondary | ICD-10-CM

## 2019-09-23 DIAGNOSIS — L02425 Furuncle of right lower limb: Secondary | ICD-10-CM | POA: Diagnosis not present

## 2019-09-23 DIAGNOSIS — F0150 Vascular dementia without behavioral disturbance: Secondary | ICD-10-CM

## 2019-09-23 DIAGNOSIS — E119 Type 2 diabetes mellitus without complications: Secondary | ICD-10-CM

## 2019-09-23 NOTE — Progress Notes (Signed)
Patient ID: [name], male   DOB: 09-25-1941, 78 y.o.   MRN: [id number]  Location:  [facility] Room Number: 120 Place of Service:  SNF [phone] Provider:   Gayland Curry, DO  Patient Care Team: Ryan Curry, DO as PCP - General (Geriatric Medicine)  Extended Emergency Contact Information Primary Emergency Contact: [name] Address: [address]          [place], [place] [address] [name] of [place] Phone: [phone] Mobile Phone: [phone] Relation: Spouse  Code Status:  DNR Goals of care: Advanced Directive information Advanced Directives 12/17/2018  Does Patient Have a Medical Advance Directive? Yes  Type of Paramedic of [place];Living will;Out of facility DNR (pink MOST or yellow form)  Does patient want to make changes to medical advance directive? No - Patient declined  Copy of [place] in Chart? Yes - validated most recent copy scanned in chart (See row information)  Would patient like information on creating a medical advance directive? -  Pre-existing out of facility DNR order (yellow form or pink MOST form) Yellow form placed in chart (order not valid for inpatient use)     Chief Complaint  Patient presents with  . Medical Management of Chronic Issues    Routine visit    HPI:  Pt is a 78 y.o. male seen today for medical management of chronic diseases.    [name] is doing ok per nursing staff.  He had a "boil" on his thigh that was treated with warm compresses and bactroban for a week (ordered 12/4).  It is now just a pinpoint opening on the thigh.    He seemed to be having a good day when I saw him.  He spoke to me some.  I complimented his [name] wreath on his door and he smiled.  He answered some of my questions about how he was feeling, pain, etc.  He also thanked me for coming and said it was good to see me.    Weight is stable over the past 6 weeks.  On 12/11,  [name], his wife, requested that he be permitted to eat bacon though it's not part of his dysphagia 3 diet and a waiver was provided for her to sign.  Weight was 290 in 2/19 and now 269 lbs.  He's notably thinner also with loss of muscle mass.  Past Medical History:  Diagnosis Date  . Abnormality of gait 04/05/2015  . Benign prostatic hypertrophy   . Dementia ([place])   . Diabetes mellitus type II    no meds  . Elevated PSA   . Erectile dysfunction   . Gout   . Hereditary and idiopathic peripheral neuropathy 08/02/2016  . Hypertension   . Memory changes   . Memory difficulties 04/05/2015  . Nephrolithiasis    hx of  . Obstructive sleep apnea   . Partial Achilles tendon tear   . Rosacea, acne   . Tremor 04/05/2015   Past Surgical History:  Procedure Laterality Date  . CARPAL TUNNEL RELEASE  2004  . CARPAL TUNNEL RELEASE  10/31/2011   Procedure: CARPAL TUNNEL RELEASE;  Surgeon: [name]., MD;  Location: [facility];  Service: Orthopedics;  Laterality: Left;  . EYE SURGERY     cataracts  . HAND SURGERY  2007   right hand  . INGUINAL HERNIA REPAIR    . JOINT REPLACEMENT     left total knee  .  KNEE SURGERY  1961   left knee  . TONSILLECTOMY    . TONSILLECTOMY AND ADENOIDECTOMY    . TRANSURETHRAL RESECTION OF PROSTATE      Allergies  Allergen Reactions  . Ciprofloxacin     REACTION: Diarrhea,l rectal irritation    Outpatient Encounter Medications as of 09/23/2019  Medication Sig  . acetaminophen (TYLENOL) 500 MG tablet Take 1,000 mg by mouth 3 (three) times daily.   Marland Kitchen allopurinol (ZYLOPRIM) 100 MG tablet Take 100 mg by mouth daily.   Marland Kitchen amLODipine (NORVASC) 10 MG tablet Take 10 mg by mouth daily.  . carboxymethylcellulose (REFRESH PLUS) 0.5 % SOLN 2 drops 2 (two) times daily as needed.  . Cholecalciferol (VITAMIN D-3 PO) Take 2,000 Units by mouth daily.   . Eyelid Cleansers (OCUSOFT EYELID CLEANSING) PADS Apply topically every morning.  . finasteride  (PROSCAR) 5 MG tablet Take 5 mg by mouth at bedtime.   . hydrocortisone 2.5 % cream Apply 1 application topically as needed.  . pantoprazole (PROTONIX) 40 MG tablet Take 40 mg by mouth daily.   . sertraline (ZOLOFT) 100 MG tablet Take 100 mg by mouth at bedtime.  . tamsulosin (FLOMAX) 0.4 MG CAPS capsule Take 0.4 mg by mouth at bedtime.   No facility-administered encounter medications on file as of 09/23/2019.    Review of Systems  Constitutional: Positive for appetite change, fatigue and unexpected weight change. Negative for activity change, chills and fever.  HENT: Positive for trouble swallowing. Negative for congestion.   Eyes: Negative for visual disturbance.  Respiratory: Negative for chest tightness and shortness of breath.   Cardiovascular: Negative for leg swelling.  Gastrointestinal: Negative for abdominal pain, constipation, diarrhea, nausea and vomiting.  Genitourinary: Negative for dysuria.  Musculoskeletal: Positive for gait problem. Negative for arthralgias and myalgias.  Skin: Negative for color change.  Neurological: Positive for speech difficulty and weakness. Negative for dizziness and numbness.  Hematological: Does not bruise/bleed easily.  Psychiatric/Behavioral: Positive for confusion. Negative for agitation, behavioral problems and sleep disturbance.       Did say he did not sleep well last night, but I was unable to determine if that was correct    Immunization History  Administered Date(s) Administered  . Influenza Split 08/18/2011, 08/08/2012  . Influenza Whole 08/21/2006, 07/11/2007, 09/13/2009, 07/13/2010  . Influenza, High Dose Seasonal PF 07/24/2019  . Influenza, Seasonal, Injecte, Preservative Fre 07/10/2013  . Influenza,inj,Quad PF,6+ Mos 07/30/2018  . Influenza-Unspecified 08/08/2012, 07/17/2014, 08/10/2014, 06/16/2015, 07/30/2017  . Pneumococcal Conjugate-13 01/06/2014  . Pneumococcal Polysaccharide-23 03/12/2012, 03/10/2014  . Td 10/09/1992  .  Tdap 03/12/2012, 01/08/2014  . Zoster 12/26/2007, 03/10/2014  . Zoster Recombinat (Shingrix) 01/23/2018   Pertinent  Health Maintenance Due  Topic Date Due  . URINE MICROALBUMIN  04/24/2018  . HEMOGLOBIN A1C  11/16/2019  . FOOT EXAM  06/29/2020  . INFLUENZA VACCINE  Completed  . PNA vac Low Risk Adult  Completed  . OPHTHALMOLOGY EXAM  Discontinued  . COLONOSCOPY  Discontinued   Fall Risk  01/10/2019 04/30/2018  Falls in the past year? 0 No  Number falls in past yr: 0 -  Injury with Fall? 0 -  Risk for fall due to : Impaired balance/gait;Mental status change -  Follow up Falls evaluation completed -   Functional Status Survey:    Vitals:   09/23/19 1120  BP: (!) 123/56  Pulse: 69  Resp: 18  Temp: 98.2 F (36.8 C)  TempSrc: Oral  SpO2: 100%  Weight: 227 lb (103  kg)  Height: 6\' 3"  (1.905 m)   Body mass index is 28.37 kg/m. Physical Exam Constitutional:      General: He is not in acute distress.    Appearance: Normal appearance. He is not ill-appearing or toxic-appearing.  HENT:     Head: Normocephalic and atraumatic.  Cardiovascular:     Rate and Rhythm: Normal rate and regular rhythm.     Pulses: Normal pulses.     Heart sounds: Normal heart sounds.  Pulmonary:     Effort: Pulmonary effort is normal.     Breath sounds: Normal breath sounds. No wheezing, rhonchi or rales.  Abdominal:     General: Bowel sounds are normal. There is no distension.     Palpations: Abdomen is soft. There is no mass.     Tenderness: There is no abdominal tenderness. There is no guarding or rebound.  Musculoskeletal:        General: Normal range of motion.     Right lower leg: No edema.     Left lower leg: No edema.  Skin:    General: Skin is warm and dry.     Comments: Rhinophyma of nose; small pinpoint opening on thigh (prior furuncle)  Neurological:     Mental Status: He is alert. Mental status is at baseline.     Motor: Weakness present.     Gait: Gait abnormal.     Comments:  Alert and speaking some with me today, smiling and friendly  Psychiatric:        Mood and Affect: Mood normal.     Labs reviewed: Recent Labs    11/22/18 0000 06/30/19 0000  NA 140 144  K 4.1 4.4  BUN 24* 46*  CREATININE 1.5* 2.2*   No results for input(s): AST, ALT, ALKPHOS, BILITOT, PROT, ALBUMIN in the last 8760 hours. Recent Labs    11/22/18 0000 06/30/19 0000  WBC 7.0 16.5  HGB 11.5* 10.6*  HCT 35* 32*  PLT 191 200   Lab Results  Component Value Date   TSH 1.74 11/22/2018   Lab Results  Component Value Date   HGBA1C 6.0 05/16/2019   Lab Results  Component Value Date   CHOL 151 11/11/2013   HDL 33.00 (L) 11/11/2013   LDLCALC 96 11/11/2013   LDLDIRECT 97.8 05/26/2009   TRIG 112.0 11/11/2013   CHOLHDL 5 11/11/2013     Assessment/Plan 1. Mixed Alzheimer's and vascular dementia ([place]) -has been progressing with decreased speech and increased dysphagia, weight loss -cont SNF level support and comfort-based goals  2. Furuncle of right thigh -nearly resolve, nursing will cont to monitor  3. Severe obstructive sleep apnea -continues CPAP but refuses at times  4. Diabetes mellitus type 2 without retinopathy ([place]) -has been well controlled; I'd taken him off metformin due to weight loss  Lab Results  Component Value Date   HGBA1C 6.0 05/16/2019  -no need to limit diet in terms of carbs at this point--encourage foods he enjoys as tolerated  5. Dysphagia, unspecified type -on dysphagia 3 diet but now with waiver for bacon  6. Weight loss -has stabilized the past 6 wks -hopefully, he will tolerate the bacon with his dysphagia as  his wife suggested so he can get some extra calories and protein to maintain his weight  Family/ staff Communication: discussed with snf nurse  Labs/tests ordered:  No new  Maksymilian Mabey L. Harnoor Kohles, D.O. [facility] [address], [place] [address] Cell  Phone (Mon-Fri 8am-5pm):   [phone] On Call:  [phone] & follow prompts after 5pm & weekends Office Phone:  [phone] Office Fax:  [phone]

## 2019-10-06 LAB — NOVEL CORONAVIRUS, NAA: SARS-CoV-2, NAA: NEGATIVE

## 2019-10-09 ENCOUNTER — Non-Acute Institutional Stay (SKILLED_NURSING_FACILITY): Payer: Medicare Other | Admitting: Adult Health

## 2019-10-09 ENCOUNTER — Encounter: Payer: Self-pay | Admitting: Adult Health

## 2019-10-09 DIAGNOSIS — Z20828 Contact with and (suspected) exposure to other viral communicable diseases: Secondary | ICD-10-CM | POA: Diagnosis not present

## 2019-10-09 DIAGNOSIS — R509 Fever, unspecified: Secondary | ICD-10-CM | POA: Diagnosis not present

## 2019-10-09 DIAGNOSIS — R338 Other retention of urine: Secondary | ICD-10-CM

## 2019-10-09 DIAGNOSIS — Z9189 Other specified personal risk factors, not elsewhere classified: Secondary | ICD-10-CM | POA: Diagnosis not present

## 2019-10-09 DIAGNOSIS — T17908A Unspecified foreign body in respiratory tract, part unspecified causing other injury, initial encounter: Secondary | ICD-10-CM

## 2019-10-09 DIAGNOSIS — N401 Enlarged prostate with lower urinary tract symptoms: Secondary | ICD-10-CM | POA: Diagnosis not present

## 2019-10-09 NOTE — Progress Notes (Addendum)
Location:  Occupational psychologist of Service:  SNF (31) Provider:   Cindi Carbon, ANP [place] [phone]  Gayland Curry, DO  Patient Care Team: Gayland Curry, DO as PCP - General (Geriatric Medicine)  Extended Emergency Contact Information Primary Emergency Contact: Lingle,[name] Address: [address]          [place], [place] [address] [name] of [place] Phone: [phone] Mobile Phone: [phone] Relation: Spouse  Code Status:  DNR Goals of care: Advanced Directive information Advanced Directives 12/17/2018  Does Patient Have a Medical Advance Directive? Yes  Type of Paramedic of [place];Living will;Out of facility DNR (pink MOST or yellow form)  Does patient want to make changes to medical advance directive? No - Patient declined  Copy of [place] in Chart? Yes - validated most recent copy scanned in chart (See row information)  Would patient like information on creating a medical advance directive? -  Pre-existing out of facility DNR order (yellow form or pink MOST form) Yellow form placed in chart (order not valid for inpatient use)     Chief Complaint  Patient presents with  . Acute Visit    vomit, fever, not eating    HPI:  Pt is a 78 y.o. male seen today for an acute visit for vomiting, fever, not eating, and cough.  He has a hx of advanced AD and his goals of care are comfort based. He can not provide a history. His nurse reports that on 12/30 he developed a fever of 100.8 and vomited x1. On that evening, and over night going into 12/31 he had a temp of 101.5 and began to cough. The nurse reports he has been lethargic and not eating well. He is voiding and having bowel movements regularly with no diarrhea or abd pain. He has a congested cough but no sputum production. No chest pain or shortness of breath. Sats are 93% on room air. Covid swab on 12/28 was negative but was  done again 10/09/2019 due to the above findings and his pending. He has had a known contact with Covid 19 this week.   Past Medical History:  Diagnosis Date  . Abnormality of gait 04/05/2015  . Benign prostatic hypertrophy   . Dementia ([place])   . Diabetes mellitus type II    no meds  . Elevated PSA   . Erectile dysfunction   . Gout   . Hereditary and idiopathic peripheral neuropathy 08/02/2016  . Hypertension   . Memory changes   . Memory difficulties 04/05/2015  . Nephrolithiasis    hx of  . Obstructive sleep apnea   . Partial Achilles tendon tear   . Rosacea, acne   . Tremor 04/05/2015   Past Surgical History:  Procedure Laterality Date  . CARPAL TUNNEL RELEASE  2004  . CARPAL TUNNEL RELEASE  10/31/2011   Procedure: CARPAL TUNNEL RELEASE;  Surgeon: Cammie Sickle., MD;  Location: [facility];  Service: Orthopedics;  Laterality: Left;  . EYE SURGERY     cataracts  . HAND SURGERY  2007   right hand  . INGUINAL HERNIA REPAIR    . JOINT REPLACEMENT     left total knee  . KNEE SURGERY  1961   left knee  . TONSILLECTOMY    . TONSILLECTOMY AND ADENOIDECTOMY    . TRANSURETHRAL RESECTION OF PROSTATE      Allergies  Allergen Reactions  . Ciprofloxacin  REACTION: Diarrhea,l rectal irritation    Outpatient Encounter Medications as of 10/09/2019  Medication Sig  . acetaminophen (TYLENOL) 500 MG tablet Take 1,000 mg by mouth 3 (three) times daily.   Marland Kitchen allopurinol (ZYLOPRIM) 100 MG tablet Take 100 mg by mouth daily.   Marland Kitchen amLODipine (NORVASC) 10 MG tablet Take 10 mg by mouth daily.  . carboxymethylcellulose (REFRESH PLUS) 0.5 % SOLN 2 drops 2 (two) times daily as needed.  . Cholecalciferol (VITAMIN D-3 PO) Take 2,000 Units by mouth daily.   . Eyelid Cleansers (OCUSOFT EYELID CLEANSING) PADS Apply topically every morning.  . finasteride (PROSCAR) 5 MG tablet Take 5 mg by mouth at bedtime.   . hydrocortisone 2.5 % cream Apply 1 application topically as needed.   . pantoprazole (PROTONIX) 40 MG tablet Take 40 mg by mouth daily.   . sertraline (ZOLOFT) 100 MG tablet Take 100 mg by mouth at bedtime.  . tamsulosin (FLOMAX) 0.4 MG CAPS capsule Take 0.4 mg by mouth at bedtime.   No facility-administered encounter medications on file as of 10/09/2019.    Review of Systems  Unable to perform ROS: Dementia    Immunization History  Administered Date(s) Administered  . Influenza Split 08/18/2011, 08/08/2012  . Influenza Whole 08/21/2006, 07/11/2007, 09/13/2009, 07/13/2010  . Influenza, High Dose Seasonal PF 07/24/2019  . Influenza, Seasonal, Injecte, Preservative Fre 07/10/2013  . Influenza,inj,Quad PF,6+ Mos 07/30/2018  . Influenza-Unspecified 08/08/2012, 07/17/2014, 08/10/2014, 06/16/2015, 07/30/2017  . Pneumococcal Conjugate-13 01/06/2014  . Pneumococcal Polysaccharide-23 03/12/2012, 03/10/2014  . Td 10/09/1992  . Tdap 03/12/2012, 01/08/2014  . Zoster 12/26/2007, 03/10/2014  . Zoster Recombinat (Shingrix) 01/23/2018   Pertinent  Health Maintenance Due  Topic Date Due  . HEMOGLOBIN A1C  11/16/2019  . FOOT EXAM  06/29/2020  . INFLUENZA VACCINE  Completed  . PNA vac Low Risk Adult  Completed  . OPHTHALMOLOGY EXAM  Discontinued  . COLONOSCOPY  Discontinued   Fall Risk  01/10/2019 04/30/2018  Falls in the past year? 0 No  Number falls in past yr: 0 -  Injury with Fall? 0 -  Risk for fall due to : Impaired balance/gait;Mental status change -  Follow up Falls evaluation completed -   Functional Status Survey:    Vitals:   10/09/19 0941  BP: (!) 150/82  Pulse: 65  Resp: 20  Temp: 99.7 F (37.6 C)  SpO2: 93%   There is no height or weight on file to calculate BMI. Physical Exam Vitals and nursing note reviewed.  Constitutional:      General: He is not in acute distress.    Appearance: He is ill-appearing and diaphoretic.  HENT:     Head: Normocephalic and atraumatic.     Nose: Nose normal.  Eyes:     General:        Right eye:  No discharge.        Left eye: No discharge.     Conjunctiva/sclera: Conjunctivae normal.     Pupils: Pupils are equal, round, and reactive to light.  Neck:     Thyroid: No thyromegaly.     Vascular: No JVD.     Trachea: No tracheal deviation.  Cardiovascular:     Rate and Rhythm: Normal rate and regular rhythm.     Heart sounds: No murmur.  Pulmonary:     Effort: Pulmonary effort is normal. No respiratory distress.     Breath sounds: Rhonchi present. No wheezing.  Abdominal:     General: Bowel sounds are normal. There  is distension.     Tenderness: There is no abdominal tenderness. There is no right CVA tenderness or left CVA tenderness.  Musculoskeletal:     Right lower leg: No edema.     Left lower leg: No edema.  Lymphadenopathy:     Cervical: No cervical adenopathy.  Skin:    General: Skin is warm.     Coloration: Skin is pale.  Neurological:     Comments: Not able to talk and f/c.  Arouses to physical stim, keeps eyes closed. Rigidity noted to BLE.     Labs reviewed: Recent Labs    11/22/18 0000 06/30/19 0000  NA 140 144  K 4.1 4.4  BUN 24* 46*  CREATININE 1.5* 2.2*   No results for input(s): AST, ALT, ALKPHOS, BILITOT, PROT, ALBUMIN in the last 8760 hours. Recent Labs    11/22/18 0000 06/30/19 0000  WBC 7.0 16.5  HGB 11.5* 10.6*  HCT 35* 32*  PLT 191 200   Lab Results  Component Value Date   TSH 1.74 11/22/2018   Lab Results  Component Value Date   HGBA1C 6.0 05/16/2019   Lab Results  Component Value Date   CHOL 151 11/11/2013   HDL 33.00 (L) 11/11/2013   LDLCALC 96 11/11/2013   LDLDIRECT 97.8 05/26/2009   TRIG 112.0 11/11/2013   CHOLHDL 5 11/11/2013    Significant Diagnostic Results in last 30 days:  No results found.  Assessment/Plan  1. Urinary retention due to benign prostatic hyperplasia Bladder scan 828 cc Place 16 Fr foley catheter and monitor output.   2. Aspiration into airway, initial encounter Hold all meds, food, fluids  until more alert Oxygen 2 liters [place] as needed for sats > or = 90%  3. Fever, unspecified fever cause Likely due to urinary retention and possible aspiration pna. Will rule out viral source as well.  Check covid and flu swab Maintain enhanced isolation.    Family/ staff Communication: discussed with his wife [name]. She has declined any aggressive interventions. We decided not to check a CXR and urine as she has declined antibiotic therapy due to his advanced dementia and QOl.  Labs/tests ordered:  Bladder scan covid and flu swab

## 2019-10-10 DIAGNOSIS — R1110 Vomiting, unspecified: Secondary | ICD-10-CM | POA: Diagnosis not present

## 2019-10-10 DIAGNOSIS — R509 Fever, unspecified: Secondary | ICD-10-CM | POA: Diagnosis not present

## 2019-10-14 ENCOUNTER — Other Ambulatory Visit: Payer: Self-pay | Admitting: Internal Medicine

## 2019-10-14 DIAGNOSIS — U071 COVID-19: Secondary | ICD-10-CM

## 2019-10-14 MED ORDER — HYDROCOD POLST-CPM POLST ER 10-8 MG/5ML PO SUER: 5.0000 mL | Freq: Two times a day (BID) | ORAL | 0 refills | Status: AC | PRN

## 2019-10-19 ENCOUNTER — Telehealth: Payer: Self-pay | Admitting: Family

## 2019-10-20 ENCOUNTER — Other Ambulatory Visit: Payer: Self-pay | Admitting: *Deleted

## 2019-10-20 ENCOUNTER — Encounter: Payer: Self-pay | Admitting: Internal Medicine

## 2019-10-20 NOTE — Telephone Encounter (Signed)
Pt deceased November 06, 2019

## 2019-11-10 NOTE — Telephone Encounter (Signed)
Received call 10/23/2019 from facility Nurse at 5 Am stating patient had rapid breathing due to COVID-19 infection actively dying.Request Roxanol for shortness of breath.Roxanol 5 mg oral solution every 6 hours as needed for shortness of breath.At 10 Am morning shift Nurse called states Roxanol ineffective.Roxanol 5 mg oral solution frequency order to every 2 hours as needed. At 12 pm patient's Nurse called back stated patient had no pulse and not breathing.Patinet has order for DNR.Order given to release body to funeral home desired by family.

## 2019-11-10 DEATH — deceased
